# Patient Record
Sex: Male | Born: 1960 | Race: White | Hispanic: No | Marital: Married | Smoking: Never smoker
Health system: Southern US, Community
[De-identification: ages and names within clinical notes are randomized; demographics above are authoritative.]

## PROBLEM LIST (undated history)

## (undated) DIAGNOSIS — F329 Major depressive disorder, single episode, unspecified: Secondary | ICD-10-CM

## (undated) DIAGNOSIS — I2699 Other pulmonary embolism without acute cor pulmonale: Secondary | ICD-10-CM

## (undated) DIAGNOSIS — D6851 Activated protein C resistance: Secondary | ICD-10-CM

## (undated) DIAGNOSIS — R7989 Other specified abnormal findings of blood chemistry: Secondary | ICD-10-CM

## (undated) DIAGNOSIS — N400 Enlarged prostate without lower urinary tract symptoms: Secondary | ICD-10-CM

## (undated) DIAGNOSIS — E785 Hyperlipidemia, unspecified: Secondary | ICD-10-CM

## (undated) DIAGNOSIS — I82409 Acute embolism and thrombosis of unspecified deep veins of unspecified lower extremity: Secondary | ICD-10-CM

## (undated) DIAGNOSIS — E7211 Homocystinuria: Secondary | ICD-10-CM

## (undated) DIAGNOSIS — E119 Type 2 diabetes mellitus without complications: Secondary | ICD-10-CM

## (undated) DIAGNOSIS — K219 Gastro-esophageal reflux disease without esophagitis: Secondary | ICD-10-CM

## (undated) DIAGNOSIS — S83519A Sprain of anterior cruciate ligament of unspecified knee, initial encounter: Secondary | ICD-10-CM

## (undated) HISTORY — DX: Other pulmonary embolism without acute cor pulmonale: I26.99

## (undated) HISTORY — DX: Benign prostatic hyperplasia without lower urinary tract symptoms: N40.0

## (undated) HISTORY — DX: Sprain of anterior cruciate ligament of unspecified knee, initial encounter: S83.519A

## (undated) HISTORY — PX: WISDOM TOOTH EXTRACTION: SHX21

## (undated) HISTORY — DX: Acute embolism and thrombosis of unspecified deep veins of unspecified lower extremity: I82.409

## (undated) HISTORY — DX: Activated protein C resistance: D68.51

## (undated) HISTORY — DX: Major depressive disorder, single episode, unspecified: F32.9

## (undated) HISTORY — DX: Gastro-esophageal reflux disease without esophagitis: K21.9

## (undated) HISTORY — DX: Hyperlipidemia, unspecified: E78.5

## (undated) HISTORY — DX: Homocystinuria: E72.11

## (undated) HISTORY — DX: Other specified abnormal findings of blood chemistry: R79.89

---

## 1986-01-04 HISTORY — PX: VASECTOMY: SHX75

## 1999-10-13 ENCOUNTER — Ambulatory Visit (HOSPITAL_COMMUNITY): Admission: RE | Admit: 1999-10-13 | Discharge: 1999-10-13 | Payer: Self-pay | Admitting: Family Medicine

## 1999-10-27 ENCOUNTER — Encounter: Payer: Self-pay | Admitting: Emergency Medicine

## 1999-10-27 ENCOUNTER — Inpatient Hospital Stay (HOSPITAL_COMMUNITY): Admission: EM | Admit: 1999-10-27 | Discharge: 1999-11-02 | Payer: Self-pay | Admitting: Emergency Medicine

## 1999-12-07 ENCOUNTER — Ambulatory Visit (HOSPITAL_COMMUNITY): Admission: RE | Admit: 1999-12-07 | Discharge: 1999-12-07 | Payer: Self-pay | Admitting: Critical Care Medicine

## 1999-12-07 ENCOUNTER — Encounter: Payer: Self-pay | Admitting: Critical Care Medicine

## 2001-01-04 HISTORY — PX: KNEE SURGERY: SHX244

## 2001-09-07 ENCOUNTER — Encounter: Payer: Self-pay | Admitting: Internal Medicine

## 2001-09-07 ENCOUNTER — Inpatient Hospital Stay (HOSPITAL_COMMUNITY): Admission: EM | Admit: 2001-09-07 | Discharge: 2001-09-15 | Payer: Self-pay | Admitting: *Deleted

## 2001-11-13 ENCOUNTER — Emergency Department (HOSPITAL_COMMUNITY): Admission: EM | Admit: 2001-11-13 | Discharge: 2001-11-13 | Payer: Self-pay | Admitting: Emergency Medicine

## 2001-11-13 ENCOUNTER — Encounter: Payer: Self-pay | Admitting: Emergency Medicine

## 2003-01-01 ENCOUNTER — Observation Stay (HOSPITAL_COMMUNITY): Admission: EM | Admit: 2003-01-01 | Discharge: 2003-01-02 | Payer: Self-pay | Admitting: Emergency Medicine

## 2003-01-03 ENCOUNTER — Ambulatory Visit (HOSPITAL_COMMUNITY): Admission: RE | Admit: 2003-01-03 | Discharge: 2003-01-03 | Payer: Self-pay | Admitting: *Deleted

## 2003-01-05 DIAGNOSIS — I82409 Acute embolism and thrombosis of unspecified deep veins of unspecified lower extremity: Secondary | ICD-10-CM

## 2003-01-05 DIAGNOSIS — F32A Depression, unspecified: Secondary | ICD-10-CM

## 2003-01-05 HISTORY — DX: Acute embolism and thrombosis of unspecified deep veins of unspecified lower extremity: I82.409

## 2003-01-05 HISTORY — DX: Depression, unspecified: F32.A

## 2003-05-08 ENCOUNTER — Encounter: Payer: Self-pay | Admitting: Internal Medicine

## 2003-07-26 ENCOUNTER — Ambulatory Visit (HOSPITAL_COMMUNITY): Admission: RE | Admit: 2003-07-26 | Discharge: 2003-07-26 | Payer: Self-pay | Admitting: Family Medicine

## 2003-11-05 ENCOUNTER — Ambulatory Visit: Payer: Self-pay

## 2003-12-03 ENCOUNTER — Ambulatory Visit: Payer: Self-pay | Admitting: *Deleted

## 2003-12-13 ENCOUNTER — Ambulatory Visit: Payer: Self-pay | Admitting: Internal Medicine

## 2004-01-02 ENCOUNTER — Ambulatory Visit: Payer: Self-pay | Admitting: *Deleted

## 2004-02-11 ENCOUNTER — Ambulatory Visit: Payer: Self-pay | Admitting: Cardiology

## 2004-03-24 ENCOUNTER — Ambulatory Visit: Payer: Self-pay | Admitting: Internal Medicine

## 2004-05-08 ENCOUNTER — Ambulatory Visit: Payer: Self-pay | Admitting: Cardiovascular Disease

## 2004-06-09 ENCOUNTER — Ambulatory Visit: Payer: Self-pay | Admitting: *Deleted

## 2004-07-21 ENCOUNTER — Ambulatory Visit: Payer: Self-pay | Admitting: Cardiology

## 2004-09-03 ENCOUNTER — Ambulatory Visit: Payer: Self-pay | Admitting: Internal Medicine

## 2004-09-29 ENCOUNTER — Ambulatory Visit: Payer: Self-pay | Admitting: Internal Medicine

## 2004-10-12 ENCOUNTER — Ambulatory Visit: Payer: Self-pay | Admitting: Cardiology

## 2004-11-24 ENCOUNTER — Ambulatory Visit: Payer: Self-pay | Admitting: Cardiology

## 2005-01-05 ENCOUNTER — Ambulatory Visit: Payer: Self-pay | Admitting: *Deleted

## 2005-01-11 ENCOUNTER — Ambulatory Visit: Payer: Self-pay | Admitting: Internal Medicine

## 2005-01-18 ENCOUNTER — Ambulatory Visit: Payer: Self-pay | Admitting: Internal Medicine

## 2005-01-18 ENCOUNTER — Ambulatory Visit (HOSPITAL_COMMUNITY): Admission: RE | Admit: 2005-01-18 | Discharge: 2005-01-18 | Payer: Self-pay | Admitting: Emergency Medicine

## 2005-01-19 ENCOUNTER — Ambulatory Visit: Payer: Self-pay | Admitting: Internal Medicine

## 2005-01-20 ENCOUNTER — Ambulatory Visit: Payer: Self-pay

## 2005-01-22 ENCOUNTER — Ambulatory Visit: Payer: Self-pay | Admitting: Cardiology

## 2005-01-25 ENCOUNTER — Ambulatory Visit: Payer: Self-pay | Admitting: Cardiology

## 2005-02-01 ENCOUNTER — Ambulatory Visit: Payer: Self-pay | Admitting: Cardiology

## 2005-02-14 ENCOUNTER — Emergency Department (HOSPITAL_COMMUNITY): Admission: EM | Admit: 2005-02-14 | Discharge: 2005-02-14 | Payer: Self-pay | Admitting: Emergency Medicine

## 2005-02-15 ENCOUNTER — Ambulatory Visit: Payer: Self-pay | Admitting: Internal Medicine

## 2005-02-22 ENCOUNTER — Ambulatory Visit: Payer: Self-pay | Admitting: Cardiology

## 2005-03-08 ENCOUNTER — Ambulatory Visit: Payer: Self-pay | Admitting: Cardiology

## 2005-03-30 ENCOUNTER — Ambulatory Visit: Payer: Self-pay | Admitting: Cardiology

## 2005-04-27 ENCOUNTER — Ambulatory Visit: Payer: Self-pay | Admitting: Cardiovascular Disease

## 2005-05-12 ENCOUNTER — Ambulatory Visit: Payer: Self-pay | Admitting: Cardiology

## 2005-06-01 ENCOUNTER — Ambulatory Visit: Payer: Self-pay | Admitting: Internal Medicine

## 2005-06-29 ENCOUNTER — Ambulatory Visit: Payer: Self-pay | Admitting: Cardiovascular Disease

## 2005-08-03 ENCOUNTER — Ambulatory Visit: Payer: Self-pay | Admitting: Cardiology

## 2005-08-31 ENCOUNTER — Ambulatory Visit: Payer: Self-pay | Admitting: *Deleted

## 2005-09-28 ENCOUNTER — Ambulatory Visit: Payer: Self-pay | Admitting: Cardiovascular Disease

## 2005-10-15 ENCOUNTER — Ambulatory Visit: Payer: Self-pay | Admitting: Cardiology

## 2005-11-16 ENCOUNTER — Ambulatory Visit: Payer: Self-pay | Admitting: *Deleted

## 2005-11-17 ENCOUNTER — Ambulatory Visit: Payer: Self-pay | Admitting: Internal Medicine

## 2005-11-17 LAB — CONVERTED CEMR LAB
ALT: 33 units/L (ref 0–40)
AST: 27 units/L (ref 0–37)
Albumin: 3.8 g/dL (ref 3.5–5.2)
Alkaline Phosphatase: 41 units/L (ref 39–117)
BUN: 16 mg/dL (ref 6–23)
Basophils Absolute: 0 10*3/uL (ref 0.0–0.1)
Basophils Relative: 0.2 % (ref 0.0–1.0)
CO2: 31 meq/L (ref 19–32)
Calcium: 9.2 mg/dL (ref 8.4–10.5)
Chloride: 104 meq/L (ref 96–112)
Chol/HDL Ratio, serum: 5.6
Cholesterol: 214 mg/dL (ref 0–200)
Creatinine, Ser: 1.2 mg/dL (ref 0.4–1.5)
Eosinophil percent: 1.7 % (ref 0.0–5.0)
GFR calc non Af Amer: 69 mL/min
Glomerular Filtration Rate, Af Am: 83 mL/min/{1.73_m2}
Glucose, Bld: 77 mg/dL (ref 70–99)
HCT: 46.5 % (ref 39.0–52.0)
HDL: 37.9 mg/dL — ABNORMAL LOW (ref >39.0)
Hemoglobin: 15.6 g/dL (ref 13.0–17.0)
Hgb A1c MFr Bld: 5.6 % (ref 4.6–6.0)
LDL DIRECT: 141.2 mg/dL
Lymphocytes Relative: 31.1 % (ref 12.0–46.0)
MCHC: 33.6 g/dL (ref 30.0–36.0)
MCV: 91 fL (ref 78.0–100.0)
Monocytes Absolute: 0.5 10*3/uL (ref 0.2–0.7)
Monocytes Relative: 7.5 % (ref 3.0–11.0)
Neutro Abs: 3.7 10*3/uL (ref 1.4–7.7)
Neutrophils Relative %: 59.5 % (ref 43.0–77.0)
PSA: 0.71 ng/mL (ref 0.10–4.00)
Platelets: 217 10*3/uL (ref 150–400)
Potassium: 4.2 meq/L (ref 3.5–5.1)
RBC: 5.1 M/uL (ref 4.22–5.81)
RDW: 12.1 % (ref 11.5–14.6)
Sodium: 140 meq/L (ref 135–145)
TSH: 1.42 microintl units/mL (ref 0.35–5.50)
Total Bilirubin: 0.6 mg/dL (ref 0.3–1.2)
Total CK: 127 units/L (ref 7–195)
Total Protein: 6.6 g/dL (ref 6.0–8.3)
Triglyceride fasting, serum: 232 mg/dL (ref 0–149)
VLDL: 46 mg/dL — ABNORMAL HIGH (ref 0–40)
WBC: 6.3 10*3/uL (ref 4.5–10.5)

## 2005-11-30 ENCOUNTER — Ambulatory Visit: Payer: Self-pay | Admitting: Cardiology

## 2005-12-14 ENCOUNTER — Ambulatory Visit: Payer: Self-pay | Admitting: Cardiovascular Disease

## 2005-12-30 ENCOUNTER — Ambulatory Visit: Payer: Self-pay | Admitting: Cardiology

## 2006-01-04 DIAGNOSIS — S83519A Sprain of anterior cruciate ligament of unspecified knee, initial encounter: Secondary | ICD-10-CM

## 2006-01-04 HISTORY — DX: Sprain of anterior cruciate ligament of unspecified knee, initial encounter: S83.519A

## 2006-01-27 ENCOUNTER — Ambulatory Visit: Payer: Self-pay | Admitting: Cardiology

## 2006-02-24 ENCOUNTER — Ambulatory Visit: Payer: Self-pay | Admitting: Cardiology

## 2006-03-22 ENCOUNTER — Ambulatory Visit: Payer: Self-pay | Admitting: Cardiology

## 2006-04-22 ENCOUNTER — Ambulatory Visit: Payer: Self-pay | Admitting: Cardiology

## 2006-05-09 ENCOUNTER — Ambulatory Visit: Payer: Self-pay | Admitting: Cardiovascular Disease

## 2006-05-23 ENCOUNTER — Ambulatory Visit: Payer: Self-pay | Admitting: Internal Medicine

## 2006-06-13 ENCOUNTER — Ambulatory Visit: Payer: Self-pay | Admitting: Internal Medicine

## 2006-07-14 ENCOUNTER — Ambulatory Visit: Payer: Self-pay | Admitting: Cardiovascular Disease

## 2006-08-09 ENCOUNTER — Ambulatory Visit: Payer: Self-pay | Admitting: Cardiology

## 2006-09-06 ENCOUNTER — Ambulatory Visit: Payer: Self-pay | Admitting: Cardiology

## 2006-10-11 ENCOUNTER — Ambulatory Visit: Payer: Self-pay | Admitting: Cardiovascular Disease

## 2006-11-07 ENCOUNTER — Ambulatory Visit: Payer: Self-pay | Admitting: Cardiovascular Disease

## 2006-11-29 ENCOUNTER — Telehealth (INDEPENDENT_AMBULATORY_CARE_PROVIDER_SITE_OTHER): Payer: Self-pay | Admitting: *Deleted

## 2006-12-02 ENCOUNTER — Emergency Department (HOSPITAL_COMMUNITY): Admission: EM | Admit: 2006-12-02 | Discharge: 2006-12-02 | Payer: Self-pay | Admitting: Emergency Medicine

## 2006-12-05 ENCOUNTER — Encounter: Payer: Self-pay | Admitting: Internal Medicine

## 2006-12-06 ENCOUNTER — Ambulatory Visit: Payer: Self-pay | Admitting: Cardiology

## 2006-12-07 ENCOUNTER — Encounter: Payer: Self-pay | Admitting: Internal Medicine

## 2006-12-12 ENCOUNTER — Encounter: Payer: Self-pay | Admitting: Internal Medicine

## 2007-01-03 ENCOUNTER — Ambulatory Visit: Payer: Self-pay | Admitting: Internal Medicine

## 2007-01-31 ENCOUNTER — Ambulatory Visit: Payer: Self-pay | Admitting: Cardiovascular Disease

## 2007-02-14 ENCOUNTER — Ambulatory Visit: Payer: Self-pay | Admitting: Cardiovascular Disease

## 2007-03-14 ENCOUNTER — Ambulatory Visit: Payer: Self-pay | Admitting: Cardiology

## 2007-04-11 ENCOUNTER — Ambulatory Visit: Payer: Self-pay | Admitting: Cardiology

## 2007-05-09 ENCOUNTER — Ambulatory Visit: Payer: Self-pay | Admitting: Cardiology

## 2007-05-26 ENCOUNTER — Ambulatory Visit: Payer: Self-pay | Admitting: Cardiovascular Disease

## 2007-06-20 ENCOUNTER — Ambulatory Visit: Payer: Self-pay | Admitting: Internal Medicine

## 2007-06-20 ENCOUNTER — Telehealth: Payer: Self-pay | Admitting: Internal Medicine

## 2007-06-20 DIAGNOSIS — D6851 Activated protein C resistance: Secondary | ICD-10-CM | POA: Insufficient documentation

## 2007-06-20 DIAGNOSIS — I80299 Phlebitis and thrombophlebitis of other deep vessels of unspecified lower extremity: Secondary | ICD-10-CM | POA: Insufficient documentation

## 2007-06-20 DIAGNOSIS — N400 Enlarged prostate without lower urinary tract symptoms: Secondary | ICD-10-CM | POA: Insufficient documentation

## 2007-06-20 DIAGNOSIS — G4730 Sleep apnea, unspecified: Secondary | ICD-10-CM | POA: Insufficient documentation

## 2007-06-20 DIAGNOSIS — E785 Hyperlipidemia, unspecified: Secondary | ICD-10-CM

## 2007-06-23 ENCOUNTER — Encounter (INDEPENDENT_AMBULATORY_CARE_PROVIDER_SITE_OTHER): Payer: Self-pay | Admitting: *Deleted

## 2007-06-27 ENCOUNTER — Ambulatory Visit: Payer: Self-pay | Admitting: Cardiovascular Disease

## 2007-07-04 ENCOUNTER — Encounter (INDEPENDENT_AMBULATORY_CARE_PROVIDER_SITE_OTHER): Payer: Self-pay | Admitting: *Deleted

## 2007-07-20 ENCOUNTER — Ambulatory Visit: Payer: Self-pay | Admitting: Cardiovascular Disease

## 2007-08-15 ENCOUNTER — Ambulatory Visit: Payer: Self-pay | Admitting: Cardiovascular Disease

## 2007-09-07 ENCOUNTER — Ambulatory Visit: Payer: Self-pay | Admitting: Cardiology

## 2007-10-03 ENCOUNTER — Ambulatory Visit: Payer: Self-pay | Admitting: Cardiology

## 2007-10-31 ENCOUNTER — Ambulatory Visit: Payer: Self-pay | Admitting: Cardiology

## 2007-12-05 ENCOUNTER — Ambulatory Visit: Payer: Self-pay | Admitting: Cardiovascular Disease

## 2007-12-25 ENCOUNTER — Telehealth (INDEPENDENT_AMBULATORY_CARE_PROVIDER_SITE_OTHER): Payer: Self-pay | Admitting: *Deleted

## 2008-01-08 ENCOUNTER — Ambulatory Visit: Payer: Self-pay | Admitting: Internal Medicine

## 2008-01-16 ENCOUNTER — Ambulatory Visit: Payer: Self-pay | Admitting: Cardiovascular Disease

## 2008-02-02 ENCOUNTER — Ambulatory Visit: Payer: Self-pay | Admitting: Cardiovascular Disease

## 2008-02-20 ENCOUNTER — Ambulatory Visit: Payer: Self-pay | Admitting: Cardiology

## 2008-03-08 ENCOUNTER — Ambulatory Visit: Payer: Self-pay | Admitting: Internal Medicine

## 2008-04-02 ENCOUNTER — Ambulatory Visit: Payer: Self-pay | Admitting: Cardiology

## 2008-04-25 ENCOUNTER — Ambulatory Visit: Payer: Self-pay | Admitting: Internal Medicine

## 2008-05-28 ENCOUNTER — Ambulatory Visit: Payer: Self-pay | Admitting: Cardiology

## 2008-06-05 ENCOUNTER — Encounter: Payer: Self-pay | Admitting: *Deleted

## 2008-06-20 ENCOUNTER — Ambulatory Visit: Payer: Self-pay | Admitting: Internal Medicine

## 2008-06-24 ENCOUNTER — Encounter (INDEPENDENT_AMBULATORY_CARE_PROVIDER_SITE_OTHER): Payer: Self-pay | Admitting: *Deleted

## 2008-06-24 ENCOUNTER — Telehealth (INDEPENDENT_AMBULATORY_CARE_PROVIDER_SITE_OTHER): Payer: Self-pay | Admitting: *Deleted

## 2008-07-10 ENCOUNTER — Encounter: Payer: Self-pay | Admitting: *Deleted

## 2008-07-23 ENCOUNTER — Ambulatory Visit: Payer: Self-pay | Admitting: Cardiology

## 2008-07-23 LAB — CONVERTED CEMR LAB
POC INR: 1.7
Prothrombin Time: 15.9 s

## 2008-07-25 ENCOUNTER — Telehealth: Payer: Self-pay | Admitting: Internal Medicine

## 2008-08-06 ENCOUNTER — Ambulatory Visit: Payer: Self-pay | Admitting: Cardiology

## 2008-08-06 LAB — CONVERTED CEMR LAB
POC INR: 1.7
Prothrombin Time: 15.9 s

## 2008-08-16 ENCOUNTER — Encounter: Payer: Self-pay | Admitting: Cardiology

## 2008-08-20 ENCOUNTER — Ambulatory Visit: Payer: Self-pay | Admitting: Cardiovascular Disease

## 2008-08-20 LAB — CONVERTED CEMR LAB: POC INR: 2

## 2008-09-10 ENCOUNTER — Ambulatory Visit: Payer: Self-pay | Admitting: Cardiology

## 2008-09-10 LAB — CONVERTED CEMR LAB: POC INR: 2.4

## 2008-09-11 ENCOUNTER — Encounter: Payer: Self-pay | Admitting: *Deleted

## 2008-09-11 LAB — CONVERTED CEMR LAB

## 2008-09-23 ENCOUNTER — Ambulatory Visit: Payer: Self-pay | Admitting: Internal Medicine

## 2008-09-25 LAB — CONVERTED CEMR LAB
Cholesterol: 175 mg/dL (ref 0–200)
HDL: 37.7 mg/dL — ABNORMAL LOW (ref >39.00)
Hgb A1c MFr Bld: 5.7 % (ref 4.6–6.5)
LDL Cholesterol: 102 mg/dL — ABNORMAL HIGH (ref 0–99)
Total CHOL/HDL Ratio: 5
Triglycerides: 177 mg/dL — ABNORMAL HIGH (ref 0.0–149.0)
VLDL: 35.4 mg/dL (ref 0.0–40.0)

## 2008-10-01 ENCOUNTER — Ambulatory Visit: Payer: Self-pay | Admitting: Cardiology

## 2008-10-01 LAB — CONVERTED CEMR LAB: POC INR: 3.2

## 2008-10-29 ENCOUNTER — Ambulatory Visit: Payer: Self-pay | Admitting: Cardiovascular Disease

## 2008-10-29 LAB — CONVERTED CEMR LAB: POC INR: 3

## 2008-10-30 ENCOUNTER — Encounter: Payer: Self-pay | Admitting: Internal Medicine

## 2008-11-19 ENCOUNTER — Ambulatory Visit: Payer: Self-pay | Admitting: Internal Medicine

## 2008-11-19 DIAGNOSIS — M545 Low back pain: Secondary | ICD-10-CM | POA: Insufficient documentation

## 2008-11-26 ENCOUNTER — Ambulatory Visit: Payer: Self-pay | Admitting: Cardiovascular Disease

## 2008-11-26 LAB — CONVERTED CEMR LAB: POC INR: 2.8

## 2008-12-13 ENCOUNTER — Telehealth (INDEPENDENT_AMBULATORY_CARE_PROVIDER_SITE_OTHER): Payer: Self-pay | Admitting: Cardiology

## 2008-12-13 ENCOUNTER — Ambulatory Visit: Payer: Self-pay | Admitting: Cardiology

## 2008-12-13 ENCOUNTER — Encounter (INDEPENDENT_AMBULATORY_CARE_PROVIDER_SITE_OTHER): Payer: Self-pay | Admitting: Cardiology

## 2008-12-13 LAB — CONVERTED CEMR LAB: POC INR: 2.6

## 2009-01-27 ENCOUNTER — Ambulatory Visit: Payer: Self-pay | Admitting: Internal Medicine

## 2009-01-27 LAB — CONVERTED CEMR LAB: POC INR: 2.2

## 2009-02-25 ENCOUNTER — Ambulatory Visit: Payer: Self-pay | Admitting: Cardiology

## 2009-02-25 LAB — CONVERTED CEMR LAB: POC INR: 2.8

## 2009-03-25 ENCOUNTER — Ambulatory Visit: Payer: Self-pay | Admitting: Cardiovascular Disease

## 2009-03-25 LAB — CONVERTED CEMR LAB: POC INR: 2.5

## 2009-04-22 ENCOUNTER — Ambulatory Visit: Payer: Self-pay | Admitting: Cardiovascular Disease

## 2009-04-22 LAB — CONVERTED CEMR LAB: POC INR: 2.9

## 2009-05-20 ENCOUNTER — Ambulatory Visit: Payer: Self-pay | Admitting: Internal Medicine

## 2009-05-20 LAB — CONVERTED CEMR LAB: POC INR: 3.4

## 2009-06-24 ENCOUNTER — Ambulatory Visit: Payer: Self-pay

## 2009-06-24 LAB — CONVERTED CEMR LAB: POC INR: 3

## 2009-06-25 ENCOUNTER — Ambulatory Visit: Payer: Self-pay | Admitting: Internal Medicine

## 2009-06-26 ENCOUNTER — Ambulatory Visit: Payer: Self-pay | Admitting: Internal Medicine

## 2009-06-26 DIAGNOSIS — M25469 Effusion, unspecified knee: Secondary | ICD-10-CM

## 2009-07-22 ENCOUNTER — Ambulatory Visit: Payer: Self-pay | Admitting: Internal Medicine

## 2009-07-22 LAB — CONVERTED CEMR LAB: POC INR: 3.4

## 2009-08-19 ENCOUNTER — Ambulatory Visit: Payer: Self-pay | Admitting: Cardiology

## 2009-08-19 LAB — CONVERTED CEMR LAB: POC INR: 2.5

## 2009-09-16 ENCOUNTER — Ambulatory Visit: Payer: Self-pay | Admitting: Cardiovascular Disease

## 2009-09-16 LAB — CONVERTED CEMR LAB: POC INR: 2.8

## 2009-09-26 ENCOUNTER — Telehealth (INDEPENDENT_AMBULATORY_CARE_PROVIDER_SITE_OTHER): Payer: Self-pay | Admitting: *Deleted

## 2009-10-14 ENCOUNTER — Ambulatory Visit: Payer: Self-pay | Admitting: Cardiology

## 2009-10-14 LAB — CONVERTED CEMR LAB: POC INR: 2.9

## 2009-11-11 ENCOUNTER — Ambulatory Visit: Payer: Self-pay | Admitting: Internal Medicine

## 2009-11-11 LAB — CONVERTED CEMR LAB: POC INR: 2.9

## 2009-12-09 ENCOUNTER — Ambulatory Visit: Payer: Self-pay | Admitting: Cardiovascular Disease

## 2009-12-09 LAB — CONVERTED CEMR LAB: POC INR: 2.8

## 2010-01-06 ENCOUNTER — Ambulatory Visit: Admission: RE | Admit: 2010-01-06 | Discharge: 2010-01-06 | Payer: Self-pay | Source: Home / Self Care

## 2010-01-25 ENCOUNTER — Encounter: Payer: Self-pay | Admitting: Family Medicine

## 2010-02-01 LAB — CONVERTED CEMR LAB
ALT: 22 units/L (ref 0–53)
ALT: 29 units/L (ref 0–53)
ALT: 30 units/L (ref 0–53)
AST: 20 units/L (ref 0–37)
AST: 25 units/L (ref 0–37)
AST: 26 units/L (ref 0–37)
Albumin: 4.1 g/dL (ref 3.5–5.2)
Albumin: 4.2 g/dL (ref 3.5–5.2)
Albumin: 4.3 g/dL (ref 3.5–5.2)
Alkaline Phosphatase: 40 units/L (ref 39–117)
Alkaline Phosphatase: 44 units/L (ref 39–117)
Alkaline Phosphatase: 48 units/L (ref 39–117)
BUN: 16 mg/dL (ref 6–23)
BUN: 17 mg/dL (ref 6–23)
BUN: 18 mg/dL (ref 6–23)
Basophils Absolute: 0 10*3/uL (ref 0.0–0.1)
Basophils Absolute: 0 10*3/uL (ref 0.0–0.1)
Basophils Absolute: 0 10*3/uL (ref 0.0–0.1)
Basophils Relative: 0.1 % (ref 0.0–3.0)
Basophils Relative: 0.3 % (ref 0.0–1.0)
Basophils Relative: 0.5 % (ref 0.0–3.0)
Bilirubin, Direct: 0.1 mg/dL (ref 0.0–0.3)
Bilirubin, Direct: 0.1 mg/dL (ref 0.0–0.3)
Bilirubin, Direct: 0.1 mg/dL (ref 0.0–0.3)
CO2: 29 meq/L (ref 19–32)
CO2: 30 meq/L (ref 19–32)
CO2: 33 meq/L — ABNORMAL HIGH (ref 19–32)
Calcium: 9 mg/dL (ref 8.4–10.5)
Calcium: 9.2 mg/dL (ref 8.4–10.5)
Calcium: 9.3 mg/dL (ref 8.4–10.5)
Chloride: 103 meq/L (ref 96–112)
Chloride: 105 meq/L (ref 96–112)
Chloride: 105 meq/L (ref 96–112)
Cholesterol, target level: 200 mg/dL
Cholesterol: 202 mg/dL — ABNORMAL HIGH (ref 0–200)
Cholesterol: 203 mg/dL — ABNORMAL HIGH (ref 0–200)
Cholesterol: 208 mg/dL (ref 0–200)
Creatinine, Ser: 1.1 mg/dL (ref 0.4–1.5)
Creatinine, Ser: 1.2 mg/dL (ref 0.4–1.5)
Creatinine, Ser: 1.2 mg/dL (ref 0.4–1.5)
Direct LDL: 128.1 mg/dL
Direct LDL: 136.2 mg/dL
Direct LDL: 137.4 mg/dL
Eosinophils Absolute: 0.1 10*3/uL (ref 0.0–0.7)
Eosinophils Absolute: 0.1 10*3/uL (ref 0.0–0.7)
Eosinophils Absolute: 0.1 10*3/uL (ref 0.0–0.7)
Eosinophils Relative: 1.6 % (ref 0.0–5.0)
Eosinophils Relative: 1.7 % (ref 0.0–5.0)
Eosinophils Relative: 2.1 % (ref 0.0–5.0)
GFR calc Af Amer: 83 mL/min
GFR calc non Af Amer: 67.84 mL/min (ref >60)
GFR calc non Af Amer: 69 mL/min
GFR calc non Af Amer: 75.32 mL/min (ref >60)
Glucose, Bld: 109 mg/dL — ABNORMAL HIGH (ref 70–99)
Glucose, Bld: 92 mg/dL (ref 70–99)
Glucose, Bld: 96 mg/dL (ref 70–99)
HCT: 44.4 % (ref 39.0–52.0)
HCT: 45.5 % (ref 39.0–52.0)
HCT: 46 % (ref 39.0–52.0)
HDL goal, serum: 40 mg/dL
HDL: 38.3 mg/dL — ABNORMAL LOW (ref >39.0)
HDL: 40.2 mg/dL (ref >39.00)
HDL: 40.6 mg/dL (ref >39.00)
Hemoglobin: 15.5 g/dL (ref 13.0–17.0)
Hemoglobin: 16.1 g/dL (ref 13.0–17.0)
Hemoglobin: 16.2 g/dL (ref 13.0–17.0)
INR: 2 — ABNORMAL HIGH (ref 0.8–1.0)
LDL Goal: 100 mg/dL
Lymphocytes Relative: 32.1 % (ref 12.0–46.0)
Lymphocytes Relative: 33.9 % (ref 12.0–46.0)
Lymphocytes Relative: 34.8 % (ref 12.0–46.0)
Lymphs Abs: 1.9 10*3/uL (ref 0.7–4.0)
Lymphs Abs: 2.1 10*3/uL (ref 0.7–4.0)
MCHC: 35 g/dL (ref 30.0–36.0)
MCHC: 35.1 g/dL (ref 30.0–36.0)
MCHC: 35.6 g/dL (ref 30.0–36.0)
MCV: 90.9 fL (ref 78.0–100.0)
MCV: 92.2 fL (ref 78.0–100.0)
MCV: 93.1 fL (ref 78.0–100.0)
Monocytes Absolute: 0.4 10*3/uL (ref 0.1–1.0)
Monocytes Absolute: 0.5 10*3/uL (ref 0.1–1.0)
Monocytes Absolute: 0.5 10*3/uL (ref 0.1–1.0)
Monocytes Relative: 7.8 % (ref 3.0–12.0)
Monocytes Relative: 8 % (ref 3.0–12.0)
Monocytes Relative: 8.9 % (ref 3.0–12.0)
Neutro Abs: 3.1 10*3/uL (ref 1.4–7.7)
Neutro Abs: 3.1 10*3/uL (ref 1.4–7.7)
Neutro Abs: 3.7 10*3/uL (ref 1.4–7.7)
Neutrophils Relative %: 53.9 % (ref 43.0–77.0)
Neutrophils Relative %: 55.9 % (ref 43.0–77.0)
Neutrophils Relative %: 58.4 % (ref 43.0–77.0)
PSA: 1 ng/mL (ref 0.10–4.00)
PSA: 1.02 ng/mL (ref 0.10–4.00)
PSA: 1.36 ng/mL (ref 0.10–4.00)
Platelets: 191 10*3/uL (ref 150.0–400.0)
Platelets: 200 10*3/uL (ref 150–400)
Platelets: 202 10*3/uL (ref 150.0–400.0)
Potassium: 4.1 meq/L (ref 3.5–5.1)
Potassium: 4.5 meq/L (ref 3.5–5.1)
Potassium: 4.6 meq/L (ref 3.5–5.1)
Prothrombin Time: 20.5 s — ABNORMAL HIGH (ref 10.9–13.3)
RBC: 4.77 M/uL (ref 4.22–5.81)
RBC: 4.99 M/uL (ref 4.22–5.81)
RBC: 5.01 M/uL (ref 4.22–5.81)
RDW: 12.1 % (ref 11.5–14.6)
RDW: 12.5 % (ref 11.5–14.6)
RDW: 13.2 % (ref 11.5–14.6)
Sodium: 139 meq/L (ref 135–145)
Sodium: 141 meq/L (ref 135–145)
Sodium: 142 meq/L (ref 135–145)
TSH: 0.97 microintl units/mL (ref 0.35–5.50)
TSH: 1.04 microintl units/mL (ref 0.35–5.50)
TSH: 1.26 microintl units/mL (ref 0.35–5.50)
Total Bilirubin: 0.6 mg/dL (ref 0.3–1.2)
Total Bilirubin: 0.8 mg/dL (ref 0.3–1.2)
Total Bilirubin: 1.1 mg/dL (ref 0.3–1.2)
Total CHOL/HDL Ratio: 5
Total CHOL/HDL Ratio: 5
Total CHOL/HDL Ratio: 5.4
Total Protein: 6.7 g/dL (ref 6.0–8.3)
Total Protein: 6.9 g/dL (ref 6.0–8.3)
Total Protein: 7 g/dL (ref 6.0–8.3)
Triglycerides: 202 mg/dL — ABNORMAL HIGH (ref 0.0–149.0)
Triglycerides: 268 mg/dL (ref 0–149)
Triglycerides: 295 mg/dL — ABNORMAL HIGH (ref 0.0–149.0)
VLDL: 40.4 mg/dL — ABNORMAL HIGH (ref 0.0–40.0)
VLDL: 54 mg/dL — ABNORMAL HIGH (ref 0–40)
VLDL: 59 mg/dL — ABNORMAL HIGH (ref 0.0–40.0)
WBC: 5.6 10*3/uL (ref 4.5–10.5)
WBC: 5.7 10*3/uL (ref 4.5–10.5)
WBC: 6.4 10*3/uL (ref 4.5–10.5)

## 2010-02-03 ENCOUNTER — Ambulatory Visit: Admission: RE | Admit: 2010-02-03 | Discharge: 2010-02-03 | Payer: Self-pay | Source: Home / Self Care

## 2010-02-03 NOTE — Medication Information (Signed)
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 yes       Details: PRN  mucinex  Recent/future dental: no  Any missed doses?: no       Is patient compliant with meds? yes       Allergies: 1)  ! Codeine 2)  ! Niacin  Anticoagulation Management History:      The patient is taking warfarin and comes in today for a routine follow up visit.  Negative risk factors for bleeding include an age less than 73 years old and no history of CVA/TIA.  The bleeding index is 'low risk'.  Negative CHADS2 values include History of HTN, Age > 55 years old, History of Diabetes, and Prior Stroke/CVA/TIA.  The start date was 10/24/1999.  His last INR was 2.0 ratio.  Anticoagulation responsible provider: Eden Emms MD, Theron Arista.  INR POC: 2.8.  Cuvette Lot#: [id number].  Exp: 10/2010.    Anticoagulation Management Assessment/Plan:      The patient's current anticoagulation dose is Coumadin 5 mg  tabs: Take as directed by coumadin clinic..  The target INR is 2.5-3.5.  The next INR is due 01/06/2010.  Anticoagulation instructions were given to patient.  Results were reviewed/authorized by Bethena Midget, RN, BSN.  He was notified by Bethena Midget, RN, BSN.         Prior Anticoagulation Instructions: INR 2.9  Continue same dose of 1 tablet every day.  Recheck INR in 4 weeks.   Current Anticoagulation Instructions: INR 2.8 Continue 5mg s daily. Recheck in 4 weeks.

## 2010-02-03 NOTE — Medication Information (Signed)
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 no    Recent/future dental: no  Any missed doses?: no       Is patient compliant with meds? yes       Allergies: 1)  ! Codeine 2)  ! Niacin  Anticoagulation Management History:      The patient is taking warfarin and comes in today for a routine follow up visit.  Negative risk factors for bleeding include an age less than 52 years old and no history of CVA/TIA.  The bleeding index is 'low risk'.  Negative CHADS2 values include History of HTN, Age > 52 years old, History of Diabetes, and Prior Stroke/CVA/TIA.  The start date was 10/24/1999.  His last INR was 2.0 ratio.  Anticoagulation responsible provider: Graciela Husbands MD, Viviann Spare.  INR POC: 2.9.  Cuvette Lot#: [id number].  Exp: 12/2010.    Anticoagulation Management Assessment/Plan:      The patient's current anticoagulation dose is Coumadin 5 mg  tabs: Take as directed by coumadin clinic..  The target INR is 2.5-3.5.  The next INR is due 12/09/2009.  Anticoagulation instructions were given to patient.  Results were reviewed/authorized by Weston Brass, PharmD.  He was notified by Weston Brass PharmD.         Prior Anticoagulation Instructions: INR 2.9  Continue taking 1 tablet everyday. Recheck in 4 weeks.   Current Anticoagulation Instructions: INR 2.9  Continue same dose of 1 tablet every day.  Recheck INR in 4 weeks.

## 2010-02-03 NOTE — Progress Notes (Signed)
Summary: VYTORIN REFILL  Phone Note Refill Request Message from:  Fax from Pharmacy on September 26, 2009 3:04 PM  Refills Requested: Medication #1:  VYTORIN 10-40 MG  TABS Take one tablet daily [facility], [address] - [phone]  Initial call taken by: Jerolyn Shin,  September 26, 2009 3:05 PM    Prescriptions: VYTORIN 10-40 MG  TABS (EZETIMIBE-SIMVASTATIN) Take one tablet daily  #90 x 1   Entered by:   Doristine Devoid CMA   Authorized by:   Marga Melnick MD   Signed by:   Doristine Devoid CMA on 09/26/2009   Method used:   Electronically to        [facility]* (retail)       [address] [place], [place]  [address]       Ph: [phone] or [phone]       Fax: [phone]   RxID:   [id number]

## 2010-02-03 NOTE — Medication Information (Signed)
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 no    Recent/future dental: no  Any missed doses?: no       Is patient compliant with meds? yes       Current Medications (verified): 1)  Vytorin 10-40 Mg  Tabs (Ezetimibe-Simvastatin) .... Take One Tablet Daily 2)  Vitamin C 3)  Fish Oil 4)  B12 5)  Coumadin 5 Mg  Tabs (Warfarin Sodium) .... Take As Directed By Coumadin Clinic. 6)  Folic Acid 400 Mcg Tabs (Folic Acid) .Marland Kitchen.. 1 By Mouth Once Daily 7)  Cinnamon 500 Mg Caps (Cinnamon) .Marland Kitchen.. 1 By Mouth Once Daily  Allergies (verified): 1)  ! Codeine 2)  ! Niacin  Anticoagulation Management History:      The patient is taking warfarin and comes in today for a routine follow up visit.  Negative risk factors for bleeding include an age less than 37 years old and no history of CVA/TIA.  The bleeding index is 'low risk'.  Negative CHADS2 values include History of HTN, Age > 33 years old, History of Diabetes, and Prior Stroke/CVA/TIA.  The start date was 10/24/1999.  His last INR was 2.0 ratio.  Anticoagulation responsible Rashae Rother: Myrtis Ser MD, Tinnie Gens.  INR POC: 2.5.  Cuvette Lot#: [id number].  Exp: 09/2010.    Anticoagulation Management Assessment/Plan:      The patient's current anticoagulation dose is Coumadin 5 mg  tabs: Take as directed by coumadin clinic..  The target INR is 2.5-3.5.  The next INR is due 09/16/2009.  Anticoagulation instructions were given to patient.  Results were reviewed/authorized by Reina Fuse, PharmD.  He was notified  by Reina Fuse PharmD.         Prior Anticoagulation Instructions: INR 3.4  Continue same dose of 1 tab daily.  Re-check INR in 4 weeks.  Current Anticoagulation Instructions: INR 2.5   Continue taking Coumadin 1 tab (5 mg) every day. Return to clinic in 4 weeks.

## 2010-02-03 NOTE — Assessment & Plan Note (Signed)
Summary: CPX/FASTING/KDC   Vital Signs:  Patient profile:   50 year old male Height:      69.5 inches Weight:      189.6 pounds BMI:     27.70 Temp:     98.0 degrees F oral Pulse rate:   64 / minute Resp:     14 per minute BP sitting:   100 / 68  (left arm) Cuff size:   large  Vitals Entered By: Shonna Chock (June 25, 2009 9:33 AM)  CC: Lipid Management Comments REVIEWED MED LIST, PATIENT AGREED DOSE AND INSTRUCTION CORRECT  ** Patient states he travles to [place] often and TD Vaccine UTD**   CC:  Lipid Management.  History of Present Illness: Evan Cabrera is here for a physical; he is asymptomatic presently.  Lipid Management History:      Positive NCEP/ATP III risk factors include male age 81 years old or older, HDL cholesterol less than 40, and family history for ischemic heart disease (males less than 13 years old).  Negative NCEP/ATP III risk factors include non-diabetic, non-tobacco-user status, non-hypertensive, no ASHD (atherosclerotic heart disease), no prior stroke/TIA, no peripheral vascular disease, and no history of aortic aneurysm.     Allergies: 1)  ! Codeine 2)  ! Niacin  Past History:  Past Medical History: Hyperlipidemia: NMR Lipoprofile 2005: LDL 137(1845/1192), HDL 37,TG 188. LDL goal = < 100. Framingham Study LDL goal = < 130. 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 ? apnea as per Marcelino Duster; no am headaches or daytime somnulence  Physical Exam  General:  well-nourished; alert,appropriate and cooperative throughout examination Head:  Normocephalic and atraumatic without obvious abnormalities. No apparent alopecia  Eyes:  No corneal or conjunctival inflammation noted. Perrla. Funduscopic exam benign, without hemorrhages, exudates or papilledema.  Ears:  External ear exam shows no significant lesions or deformities.  Otoscopic examination reveals clear canals, tympanic membranes are intact bilaterally without bulging, retraction, inflammation or discharge. Hearing is grossly normal bilaterally. Nose:  External nasal examination shows no deformity or inflammation. Nasal mucosa are  minimally congested on R  without lesions or exudates. Mouth:  Oral mucosa and oropharynx without lesions or exudates.  Teeth in good repair. Neck:  No deformities, masses, or tenderness noted. Lungs:  Normal respiratory effort, chest expands symmetrically. Lungs are clear to auscultation, no crackles or wheezes. Heart:  regular rhythm, no murmur, no gallop, no rub, no JVD, no HJR, and bradycardia.  S4 Abdomen:  Bowel sounds positive,abdomen soft  without masses, organomegaly or hernias noted. Slight dullness LUQ Rectal:  No external abnormalities noted. Normal sphincter tone. No rectal masses or tenderness. Genitalia:  Testes bilaterally descended without nodularity, tenderness or masses. No scrotal masses or lesions. No penis lesions or urethral discharge. Vasectomy scar tissue R > L Prostate:  no nodules, no asymmetry, no induration, and 1+ enlarged.   Msk:  No deformity or scoliosis noted of thoracic or lumbar spine.  Pulses:  R and L carotid,radial,dorsalis pedis and posterior tibial pulses are full and equal bilaterally Extremities:  No clubbing, cyanosis, edema, or deformity noted with normal full range of motion of all joints.   Fungal nail changes R  great  toenail Neurologic:  alert & oriented X3 and DTRs symmetrical and normal.   Skin:  Intact without suspicious lesions or rashes Cervical Nodes:  No lymphadenopathy noted Axillary Nodes:  No palpable lymphadenopathy Inguinal Nodes:  No significant adenopathy Psych:  memory intact for recent and remote, normally interactive, and good eye contact.     Impression & Recommendations:  Problem # 1:  ROUTINE GENERAL MEDICAL EXAM@HEALTH  CARE FACL (ICD-V70.0)  Orders: EKG w/ Interpretation (93000) Venipuncture (09811) TLB-Lipid Panel (80061-LIPID) TLB-BMP (Basic Metabolic Panel-BMET) (80048-METABOL) TLB-CBC Platelet - w/Differential (85025-CBCD) TLB-Hepatic/Liver Function Pnl (80076-HEPATIC) TLB-TSH (Thyroid Stimulating Hormone) (84443-TSH) TLB-PSA (Prostate Specific Antigen) (84153-PSA)  Problem # 2:  HYPERLIPIDEMIA (ICD-272.4)  His updated medication list for this problem includes:    Vytorin 10-40 Mg Tabs (Ezetimibe-simvastatin) .Marland Kitchen... 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 Take one tablet daily 2)  Vitamin C  3)  Fish Oil  4)  B12  5)  Coumadin 5 Mg Tabs (Warfarin sodium) .... Take as directed by coumadin clinic. 6)  Folic Acid 400 Mcg Tabs (Folic acid) .Marland Kitchen.. 1 by mouth once daily  Lipid Assessment/Plan:      Based on NCEP/ATP III, the patient's risk factor category is "2 or more risk factors and a calculated 10 year CAD risk of < 20%".  The patient's lipid goals are as follows: Total cholesterol goal is 200; LDL cholesterol goal is 100; HDL cholesterol goal is 40; Triglyceride goal is 150.   His LDL cholesterol goal has been met.  Secondary causes for hyperlipidemia have been ruled out.  He has been counseled on adjunctive measures for lowering his cholesterol and has been provided with dietary instructions.    Patient Instructions: 1)  Check with insurance as to best facility for  a colonoscopy to help detect colon cancer & Sleep Study to assess possible Sleep Apnea.. Prescriptions: VYTORIN 10-40 MG  TABS (EZETIMIBE-SIMVASTATIN) Take one tablet daily  #90 x 3   Entered and Authorized by:   Marga Melnick MD   Signed by:   Marga Melnick MD on 06/25/2009   Method used:   Print then Give to Patient   RxID:   [id number]    Immunization History:  Tetanus/Td Immunization History:    Tetanus/Td:  historical (01/04/1998)    Appended Document: CPX/FASTING/KDC  Laboratory Results   Urine Tests   Date/Time Reported: June 25, 2009 10:56 AM   Routine Urinalysis   Color: yellow Appearance: Clear Glucose: negative   (Normal Range: Negative) Bilirubin: negative   (Normal Range: Negative) Ketone: negative   (Normal Range: Negative) Spec. Gravity: 1.020   (Normal Range: 1.003-1.035) Blood: negative   (Normal Range: Negative) pH: 5.0   (Normal Range: 5.0-8.0) Protein: negative   (Normal Range: Negative) Urobilinogen: negative   (Normal Range: 0-1) Nitrite: negative   (Normal Range: Negative) Leukocyte Esterace: negative   (Normal Range: Negative)    Comments: Rene Kocher  Hoops  June 25, 2009 10:56 AM

## 2010-02-03 NOTE — Assessment & Plan Note (Signed)
Summary: knee swollen- no pain/cbs   Vital Signs:  Patient profile:   50 year old male Weight:      189.6 pounds Temp:     98.6 degrees F oral Pulse rate:   64 / minute Resp:     15 per minute BP sitting:   104 / 70  (left arm) Cuff size:   large  Vitals Entered By: Shonna Chock (June 26, 2009 12:16 PM) CC: Left knee was swollen and painful ,  patient was doing yard work and tree hit knee and now knee pain is better and swelling went down-? what caused swelling Comments REVIEWED MED LIST, PATIENT AGREED DOSE AND INSTRUCTION CORRECT    CC:  Left knee was swollen and painful  and patient was doing yard work and tree hit knee and now knee pain is better and swelling went down-? what caused swelling.  History of Present Illness: Yesterday he noted tightening of knee with swelling w/o trigger or injury. This am a tree he was sawing up with a chain saw struck his knee with a" pop". Now decreased ROM ; tight only with flexion.The swelling & pressure DECREASED  after the blow. PT/INR was 3.0 two days ago; it has been 2.5 -3.0 for several months.                                                                                                                                                 CPX labs reviewed & dietary interventions discussed. He has been drinking excess Gatorade.  Allergies: 1)  ! Codeine 2)  ! Niacin  Physical Exam  General:  in no acute distress; alert,appropriate and cooperative throughout examination Extremities:  Pain with medial rotation R knee. Decreased flexion due to palpable effusion L knee. Skin:  Abrasions L lateral knee area.   Impression & Recommendations:  Problem # 1:  JOINT EFFUSION, LEFT KNEE (ICD-719.06) improved post trauma  Problem # 2:  HYPERLIPIDEMIA (ICD-272.4)  His updated medication list for this problem includes:    Vytorin 10-40 Mg Tabs (Ezetimibe-simvastatin) .Marland Kitchen... Take one tablet daily  Complete Medication List: 1)  Vytorin 10-40 Mg Tabs  (Ezetimibe-simvastatin) .... Take one tablet daily 2)  Vitamin C  3)  Fish Oil  4)  B12  5)  Coumadin 5 Mg Tabs (Warfarin sodium) .... Take as directed by coumadin clinic. 6)  Folic Acid 400 Mcg Tabs (Folic acid) .Marland Kitchen.. 1 by mouth once daily 7)  Cinnamon 500 Mg Caps (Cinnamon) .Marland Kitchen.. 1 by mouth once daily  Patient Instructions: 1)  RICE today as discussed. warm compresses after today.Ortho referral if effusion fails to resolve or progresses. Take 650-1000mg  of Tylenol every 4-6 hours as needed for relief of pain or comfort of fever AVOID taking more than 4000mg   in a 24 hour period (can cause liver damage in higher doses). Consume <  40 grams of HFCS "sugar"/ day.Please schedule a follow-up fasting Lab appointment in 4 months for  2)  Lipid Panel (272.4)

## 2010-02-03 NOTE — Medication Information (Signed)
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 no    Recent/future dental: no  Any missed doses?: no       Is patient compliant with meds? yes      Comments: Leaving for [place] on Friday 02/28/09-03/04/09.  Allergies (verified): 1)  ! Codeine 2)  ! Niacin  Anticoagulation Management History:      The patient is taking warfarin and comes in today for a routine follow up visit.  Negative risk factors for bleeding include an age less than 52 years old and no history of CVA/TIA.  The bleeding index is 'low risk'.  Negative CHADS2 values include History of HTN, Age > 52 years old, and Prior Stroke/CVA/TIA.  The start date was 10/24/1999.  His last INR was 2.0 ratio.  Anticoagulation responsible provider: Daleen Squibb MD, Maisie Fus.  INR POC: 2.8.  Exp: 02/2010.    Anticoagulation Management Assessment/Plan:      The patient's current anticoagulation dose is Coumadin 5 mg  tabs: Take as directed by coumadin clinic..  The target INR is 2.5-3.5.  The next INR is due 03/25/2009.  Anticoagulation instructions were given to patient.  Results were reviewed/authorized by Cloyde Reams, RN, BSN.  He was notified by Cloyde Reams RN.         Prior Anticoagulation Instructions: INR 2.2 Today take 7.5mg s then resume 5mg s everyday. Recheck in 4 weeks.   Current Anticoagulation Instructions: INR 2.8  Continue on same dosage 5mg  daily.  Recheck in 4 weeks.

## 2010-02-03 NOTE — Medication Information (Signed)
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 no    Recent/future dental: no  Any missed doses?: no       Is patient compliant with meds? yes       Allergies (verified): 1)  ! Codeine 2)  ! Niacin  Anticoagulation Management History:      The patient is taking warfarin and comes in today for a routine follow up visit.  Negative risk factors for bleeding include an age less than 45 years old and no history of CVA/TIA.  The bleeding index is 'low risk'.  Negative CHADS2 values include History of HTN, Age > 95 years old, and Prior Stroke/CVA/TIA.  The start date was 10/24/1999.  His last INR was 2.0 ratio.  Anticoagulation responsible provider: Myrtis Ser MD, Tinnie Gens.  INR POC: 2.5.  Cuvette Lot#: [id number].  Exp: 05/2010.    Anticoagulation Management Assessment/Plan:      The patient's current anticoagulation dose is Coumadin 5 mg  tabs: Take as directed by coumadin clinic..  The target INR is 2.5-3.5.  The next INR is due 04/22/2009.  Anticoagulation instructions were given to patient.  Results were reviewed/authorized by Cloyde Reams, RN, BSN.  He was notified by Cloyde Reams RN.         Prior Anticoagulation Instructions: INR 2.8  Continue on same dosage 5mg  daily.  Recheck in 4 weeks.    Current Anticoagulation Instructions: INR 2.5  Continue on same dosage 5mg  daily.  Recheck in 4 weeks.

## 2010-02-03 NOTE — Medication Information (Signed)
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 no    Recent/future dental: no  Any missed doses?: no       Is patient compliant with meds? yes       Allergies: 1)  ! Codeine 2)  ! Niacin  Anticoagulation Management History:      The patient is taking warfarin and comes in today for a routine follow up visit.  Negative risk factors for bleeding include an age less than 38 years old and no history of CVA/TIA.  The bleeding index is 'low risk'.  Negative CHADS2 values include History of HTN, Age > 26 years old, History of Diabetes, and Prior Stroke/CVA/TIA.  The start date was 10/24/1999.  His last INR was 2.0 ratio.  Anticoagulation responsible provider: Clifton Zakariyah MD, Cristal Deer.  INR POC: 2.8.  Cuvette Lot#: [id number].  Exp: 10/2010.    Anticoagulation Management Assessment/Plan:      The patient's current anticoagulation dose is Coumadin 5 mg  tabs: Take as directed by coumadin clinic..  The target INR is 2.5-3.5.  The next INR is due 10/14/2009.  Anticoagulation instructions were given to patient.  Results were reviewed/authorized by Weston Brass, PharmD.  He was notified by Harrel Carina, PharmD candidate.         Prior Anticoagulation Instructions: INR 2.5   Continue taking Coumadin 1 tab (5 mg) every day. Return to clinic in 4 weeks.   Current Anticoagulation Instructions: INR 2.8  Continue taking 1 tablet everyday. Re-check INR 4 weeks.

## 2010-02-03 NOTE — Medication Information (Signed)
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 Codeine 2)  ! Niacin  Anticoagulation Management History:      The patient is taking warfarin and comes in today for a routine follow up visit.  Negative risk factors for bleeding include an age less than 64 years old and no history of CVA/TIA.  The bleeding index is 'low risk'.  Negative CHADS2 values include History of HTN, Age > 58 years old, and Prior Stroke/CVA/TIA.  The start date was 10/24/1999.  His last INR was 2.0 ratio.  Anticoagulation responsible provider: Riley Kill MD, Maisie Fus.  INR POC: 3.0.  Cuvette Lot#: [id number].  Exp: 09/2010.    Anticoagulation Management Assessment/Plan:      The patient's current anticoagulation dose is Coumadin 5 mg  tabs: Take as directed by coumadin clinic..  The target INR is 2.5-3.5.  The next INR is due 07/22/2009.  Anticoagulation instructions were given to patient.  Results were reviewed/authorized by Weston Brass, PharmD.  He was notified by Weston Brass PharmD.         Prior Anticoagulation Instructions: INR 3.4 Continue 5mg  daily. Recheck in 4 weeks.   Current Anticoagulation Instructions: INR 3.0  Continue same dose of 5mg  daily.

## 2010-02-03 NOTE — Medication Information (Signed)
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 no    Recent/future dental: no  Any missed doses?: no       Is patient compliant with meds? yes       Allergies: 1)  ! Codeine 2)  ! Niacin  Anticoagulation Management History:      The patient is taking warfarin and comes in today for a routine follow up visit.  Negative risk factors for bleeding include an age less than 109 years old and no history of CVA/TIA.  The bleeding index is 'low risk'.  Negative CHADS2 values include History of HTN, Age > 26 years old, History of Diabetes, and Prior Stroke/CVA/TIA.  The start date was 10/24/1999.  His last INR was 2.0 ratio.  Anticoagulation responsible provider: Graciela Husbands MD,Masen Salvas.  INR POC: 3.4.  Cuvette Lot#: [id number].  Exp: 09/2010.    Anticoagulation Management Assessment/Plan:      The patient's current anticoagulation dose is Coumadin 5 mg  tabs: Take as directed by coumadin clinic..  The target INR is 2.5-3.5.  The next INR is due 08/19/2009.  Anticoagulation instructions were given to patient.  Results were reviewed/authorized by Weston Brass, PharmD.  He was notified by Dillard Cannon.         Prior Anticoagulation Instructions: INR 3.0  Continue same dose of 5mg  daily.    Current Anticoagulation Instructions: INR 3.4  Continue same dose of 1 tab daily.  Re-check INR in 4 weeks.

## 2010-02-03 NOTE — Medication Information (Signed)
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  Bleeding/hemorrhagic complications: no    Recent/future hospitalizations: no    Any changes in medication regimen? no    Recent/future dental: no  Any missed doses?: no       Is patient compliant with meds? yes       Allergies: 1)  ! Codeine 2)  ! Niacin  Anticoagulation Management History:      The patient is taking warfarin and comes in today for a routine follow up visit.  Negative risk factors for bleeding include an age less than 59 years old and no history of CVA/TIA.  The bleeding index is 'low risk'.  Negative CHADS2 values include History of HTN, Age > 28 years old, History of Diabetes, and Prior Stroke/CVA/TIA.  The start date was 10/24/1999.  His last INR was 2.0 ratio.  Anticoagulation responsible provider: Juanda Chance MD, Smitty Cords.  INR POC: 2.9.  Cuvette Lot#: [id number].  Exp: 11/2010.    Anticoagulation Management Assessment/Plan:      The patient's current anticoagulation dose is Coumadin 5 mg  tabs: Take as directed by coumadin clinic..  The target INR is 2.5-3.5.  The next INR is due 11/11/2009.  Anticoagulation instructions were given to patient.  Results were reviewed/authorized by Weston Brass, PharmD.  He was notified by Ilean Skill D candidate.         Prior Anticoagulation Instructions: INR 2.8  Continue taking 1 tablet everyday. Re-check INR 4 weeks.   Current Anticoagulation Instructions: INR 2.9  Continue taking 1 tablet everyday. Recheck in 4 weeks.

## 2010-02-03 NOTE — Medication Information (Signed)
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 no    Recent/future dental: no  Any missed doses?: no       Is patient compliant with meds? yes       Allergies: 1)  ! Codeine 2)  ! Niacin  Anticoagulation Management History:      The patient is taking warfarin and comes in today for a routine follow up visit.  Negative risk factors for bleeding include an age less than 51 years old and no history of CVA/TIA.  The bleeding index is 'low risk'.  Negative CHADS2 values include History of HTN, Age > 77 years old, and Prior Stroke/CVA/TIA.  The start date was 10/24/1999.  His last INR was 2.0 ratio.  Anticoagulation responsible provider: Excell Seltzer MD, Casimiro Needle.  INR POC: 2.9.  Cuvette Lot#: [id number].  Exp: 05/2010.    Anticoagulation Management Assessment/Plan:      The patient's current anticoagulation dose is Coumadin 5 mg  tabs: Take as directed by coumadin clinic..  The target INR is 2.5-3.5.  The next INR is due 05/20/2009.  Anticoagulation instructions were given to patient.  Results were reviewed/authorized by Elaina Pattee, PharmD.  He was notified by Elaina Pattee, PharmD.         Prior Anticoagulation Instructions: INR 2.5  Continue on same dosage 5mg  daily.  Recheck in 4 weeks.    Current Anticoagulation Instructions: INR 2.9. Take 1 tablet daily.

## 2010-02-03 NOTE — Medication Information (Signed)
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 no    Recent/future dental: no  Any missed doses?: no       Is patient compliant with meds? yes       Allergies: 1)  ! Codeine  Anticoagulation Management History:      The patient is taking warfarin and comes in today for a routine follow up visit.  Negative risk factors for bleeding include an age less than 20 years old and no history of CVA/TIA.  The bleeding index is 'low risk'.  Negative CHADS2 values include History of HTN, Age > 31 years old, and Prior Stroke/CVA/TIA.  The start date was 10/24/1999.  His last INR was 2.0 ratio.  Anticoagulation responsible provider: Daleen Squibb MD, Maisie Fus.  INR POC: 2.2.  Cuvette Lot#: [id number].  Exp: 02/2010.    Anticoagulation Management Assessment/Plan:      The patient's current anticoagulation dose is Coumadin 5 mg  tabs: Take as directed by coumadin clinic..  The target INR is 2.5-3.5.  The next INR is due 02/25/2009.  Anticoagulation instructions were given to patient.  Results were reviewed/authorized by Bethena Midget, RN, BSN.  He was notified by Bethena Midget, RN, BSN.         Prior Anticoagulation Instructions: INR 2.6  Continue 1 tab daily.    Recheck in 4 weeks.    Current Anticoagulation Instructions: INR 2.2 Today take 7.5mg s then resume 5mg s everyday. Recheck in 4 weeks.

## 2010-02-03 NOTE — Medication Information (Signed)
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 no    Recent/future dental: no  Any missed doses?: no       Is patient compliant with meds? yes       Allergies: 1)  ! Codeine 2)  ! Niacin  Anticoagulation Management History:      The patient is taking warfarin and comes in today for a routine follow up visit.  Negative risk factors for bleeding include an age less than 83 years old and no history of CVA/TIA.  The bleeding index is 'low risk'.  Negative CHADS2 values include History of HTN, Age > 55 years old, and Prior Stroke/CVA/TIA.  The start date was 10/24/1999.  His last INR was 2.0 ratio.  Anticoagulation responsible provider: Graciela Husbands MD, Viviann Spare.  INR POC: 3.4.  Cuvette Lot#: [id number].  Exp: 08/2010.    Anticoagulation Management Assessment/Plan:      The patient's current anticoagulation dose is Coumadin 5 mg  tabs: Take as directed by coumadin clinic..  The target INR is 2.5-3.5.  The next INR is due 06/17/2009.  Anticoagulation instructions were given to patient.  Results were reviewed/authorized by Bethena Midget, RN, BSN.  He was notified by Bethena Midget, RN, BSN.         Prior Anticoagulation Instructions: INR 2.9. Take 1 tablet daily.   Current Anticoagulation Instructions: INR 3.4 Continue 5mg  daily. Recheck in 4 weeks.

## 2010-02-05 NOTE — Medication Information (Signed)
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 no    Recent/future dental: no  Any missed doses?: no       Is patient compliant with meds? yes       Allergies: 1)  ! Codeine 2)  ! Niacin  Anticoagulation Management History:      Negative risk factors for bleeding include an age less than 64 years old and no history of CVA/TIA.  The bleeding index is 'low risk'.  Negative CHADS2 values include History of HTN, Age > 51 years old, History of Diabetes, and Prior Stroke/CVA/TIA.  The start date was 10/24/1999.  His last INR was 2.0 ratio.  Anticoagulation responsible provider: Eden Emms MD, Theron Arista.  INR POC: 2.5.  Exp: 10/2010.    Anticoagulation Management Assessment/Plan:      The patient's current anticoagulation dose is Coumadin 5 mg  tabs: Take as directed by coumadin clinic..  The target INR is 2.5-3.5.  The next INR is due 02/03/2010.  Anticoagulation instructions were given to patient.  Results were reviewed/authorized by Geoffry Paradise, PharmD.         Prior Anticoagulation Instructions: INR 2.8 Continue 5mg s daily. Recheck in 4 weeks.   Current Anticoagulation Instructions: INR:  2.5  Your INR is at the lower end of goal today.  Please continue taking coumadin 5 mg every day.  Return to clinic in 4 weeks for another INR check.

## 2010-02-11 NOTE — Medication Information (Signed)
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 no    Recent/future dental: no  Any missed doses?: no       Is patient compliant with meds? yes       Allergies: 1)  ! Codeine 2)  ! Niacin  Anticoagulation Management History:      The patient is taking warfarin and comes in today for a routine follow up visit.  Negative risk factors for bleeding include an age less than 39 years old and no history of CVA/TIA.  The bleeding index is 'low risk'.  Negative CHADS2 values include History of HTN, Age > 34 years old, History of Diabetes, and Prior Stroke/CVA/TIA.  The start date was 10/24/1999.  His last INR was 2.0 ratio.  Anticoagulation responsible provider: Shirlee Latch MD, [place].  INR POC: 2.8.  Cuvette Lot#: [id number].  Exp: 01/2011.    Anticoagulation Management Assessment/Plan:      The patient's current anticoagulation dose is Coumadin 5 mg  tabs: Take as directed by coumadin clinic..  The target INR is 2.5-3.5.  The next INR is due 03/03/2010.  Anticoagulation instructions were given to patient.  Results were reviewed/authorized by Weston Brass, PharmD.  He was notified by Stephannie Peters, PharmD Candidate .         Prior Anticoagulation Instructions: INR:  2.5  Your INR is at the lower end of goal today.  Please continue taking coumadin 5 mg every day.  Return to clinic in 4 weeks for another INR check.    Current Anticoagulation Instructions: INR 2.8  Coumadin 5 mg tablets - Continue 1 tablet every evening

## 2010-03-02 ENCOUNTER — Encounter: Payer: Self-pay | Admitting: Internal Medicine

## 2010-03-02 DIAGNOSIS — I82409 Acute embolism and thrombosis of unspecified deep veins of unspecified lower extremity: Secondary | ICD-10-CM | POA: Insufficient documentation

## 2010-03-02 DIAGNOSIS — I2699 Other pulmonary embolism without acute cor pulmonale: Secondary | ICD-10-CM | POA: Insufficient documentation

## 2010-03-03 ENCOUNTER — Encounter (INDEPENDENT_AMBULATORY_CARE_PROVIDER_SITE_OTHER): Payer: BC Managed Care – PPO

## 2010-03-03 ENCOUNTER — Encounter: Payer: Self-pay | Admitting: Cardiology

## 2010-03-03 DIAGNOSIS — I80299 Phlebitis and thrombophlebitis of other deep vessels of unspecified lower extremity: Secondary | ICD-10-CM

## 2010-03-03 DIAGNOSIS — I2699 Other pulmonary embolism without acute cor pulmonale: Secondary | ICD-10-CM

## 2010-03-03 DIAGNOSIS — Z7901 Long term (current) use of anticoagulants: Secondary | ICD-10-CM

## 2010-03-12 NOTE — Medication Information (Signed)
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 no    Recent/future dental: no  Any missed doses?: no       Is patient compliant with meds? yes       Allergies: 1)  ! Codeine 2)  ! Niacin  Anticoagulation Management History:      The patient is taking warfarin and comes in today for a routine follow up visit.  Negative risk factors for bleeding include an age less than 57 years old and no history of CVA/TIA.  The bleeding index is 'low risk'.  Negative CHADS2 values include History of HTN, Age > 37 years old, History of Diabetes, and Prior Stroke/CVA/TIA.  The start date was 10/24/1999.  His last INR was 2.0 ratio.  Anticoagulation responsible provider: Shirlee Latch MD, Bena Kobel.  INR POC: 2.9.  Cuvette Lot#: [id number].  Exp: 01/2011.    Anticoagulation Management Assessment/Plan:      The patient's current anticoagulation dose is Coumadin 5 mg  tabs: Take as directed by coumadin clinic..  The target INR is 2.5-3.5.  The next INR is due 03/31/2010.  Anticoagulation instructions were given to patient.  Results were reviewed/authorized by Evan Hummingbird, Evan Cabrera.  He was notified by Evan Hummingbird, Evan Cabrera.         Prior Anticoagulation Instructions: INR 2.8  Coumadin 5 mg tablets - Continue 1 tablet every evening   Current Anticoagulation Instructions: INR 2.9 Resume taking 1 tablet every day. Recheck in 4 weeks.

## 2010-03-31 ENCOUNTER — Ambulatory Visit (INDEPENDENT_AMBULATORY_CARE_PROVIDER_SITE_OTHER): Payer: BC Managed Care – PPO | Admitting: *Deleted

## 2010-03-31 DIAGNOSIS — I82409 Acute embolism and thrombosis of unspecified deep veins of unspecified lower extremity: Secondary | ICD-10-CM

## 2010-03-31 DIAGNOSIS — I2699 Other pulmonary embolism without acute cor pulmonale: Secondary | ICD-10-CM

## 2010-03-31 NOTE — Patient Instructions (Signed)
INR 2.8 Return to clinic in 4 weeks COnt with current regimen

## 2010-04-28 ENCOUNTER — Ambulatory Visit (INDEPENDENT_AMBULATORY_CARE_PROVIDER_SITE_OTHER): Payer: BC Managed Care – PPO | Admitting: *Deleted

## 2010-04-28 DIAGNOSIS — I82409 Acute embolism and thrombosis of unspecified deep veins of unspecified lower extremity: Secondary | ICD-10-CM

## 2010-04-28 DIAGNOSIS — I2699 Other pulmonary embolism without acute cor pulmonale: Secondary | ICD-10-CM

## 2010-05-22 NOTE — Discharge Summary (Signed)
[facility]  Patient:    Evan Cabrera, Evan Cabrera                         MRN: [id number] Adm. Date:  [id number] Disc. Date: 11/02/99 Attending:  Caleb Popp Dictator:   Earley Favor, RN, MSN, ACNP CC:         Selina Cooley, M.D.   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  LABORATORY DATA:  INR 3.1, PTT 119.  WBC 7.0, hemoglobin 13.2, hematocrit 36.1, platelets are 256.  Arterial blood gas revealed pH 7.37, pCO2 of 46, pO2 is not available, bicarbonate is 27.  Factor V Leiden is pending.  Protein C is 83, protein S is 99.  Sodium 139, potassium 5.0, chloride 104, glucose is 98, BUN is 18, creatinine 1.2.  Homocystine is 12.34.  Cholesterol 225, triglycerides 364, HDL is 71, LDL is 151.  Cardiolipin antibodies noted to be 6, which is negative.  Cardiolipin antibody IgG is 10.   First antibody was IgM, was 10.  Chest CT revealed bilateral pulmonary emboli.  A 12-lead EKG shows normal sinus rhythm with sinus arrhythmia, normal ECG.  HOSPITAL COURSE: #1 - BILATERAL PULMONARY EMBOLISM AND DEEP VEIN THROMBOSIS:  [name] was admitted on October 27, 1999, with DVT as documented by exam, along with pulmonary embolism was documented by CT scan.  He was treated with initial treatment with anticoagulation with heparin and placed on Coumadin.  Once his Coumadin and heparin levels became therapeutic heparin was discontinued, and he was continued on Coumadin.  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  This will be followed up on an outpatient basis, most likely by hematology consult. He reached maximal hospital benefit by November 02, 1999, and was discharged home in improved condition.  He will be continued on Coumadin, then  to follow up with Dr. Caleb Popp.  MEDICATIONS: 1. Coumadin 5 mg 1 q.d. 2. Vicodin 1-2 tablets q.4h. p.r.n. for pain.  ACTIVITY:  As tolerated, except for Coumadin restrictions.  DIET:  No restrictions, except for Coumadin restrictions.  FOLLOW-UP:  He has a follow-up with Dr. Delford Field on November 16, 1999, at 12 noon.  He has been instructed that the Coumadin clinic will call him to set up a time to evaluate his INR.  DISPOSITION/CONDITION ON DISCHARGE:  Bilateral pulmonary emboli have been addressed with heparin therapy and with the initiation of Coumadin therapy. He has a known mutation factor as described earlier and will be evaluated for prolonged anticoagulation therapy.  Most likely, hematology/oncology will be obtained in the near future.  The patients condition on discharge is improved. DD:  11/02/99 TD:  11/02/99 Job: [id number]

## 2010-05-22 NOTE — H&P (Signed)
Evan Cabrera, Evan Cabrera NO.:  [id number]   MEDICAL RECORD NO.:  [id number]                   PATIENT TYPE:  INP   LOCATION:  0364                                 FACILITY:  [facility]   PHYSICIAN:  Titus Dubin. Alwyn Ren, M.D. 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  The pain was described as constant, worse  with inspiration, and worse walking.  He also had some associated left jaw  discomfort, but no nausea, diaphoresis, or classic anginal pain.   He describes this as the same pain that he had with his pulmonary  thromboembolism in 10/01.   PAST MEDICAL HISTORY:  1. 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  He was on Coumadin from 10/01 to 8/02, which was     discontinued.  2. He has had a vasectomy as an outpatient.   FAMILY HISTORY:  Includes clotting disorder in his father who is on  Coumadin.  His father has insulin-dependent diabetes and myocardial  infarctions.  Mother had uterine cancer and hypertension.   SOCIAL HISTORY:  He has never smoked, and does not drink.  He has been  physically active recently.  Exercising to lose weight.  He had been on  Zocor, but stopped it in an attempt to control his cholesterol with diet and  exercise.  He rarely eats red meat at this time.  He runs 2 miles three  times per week with no cardiopulmonary symptoms.   MEDICATIONS:  1. Calcium 1200 mg q.d.  2. Vitamin C.  3. Multivitamins.  4. He had stopped his aspirin 325 mg q.d., as well as the  Zocor.   ALLERGIES:  He has been intolerant to CODEINE, LIPITOR, NYACIN.   REVIEW OF SYMPTOMS:  Generally negative.  Two weeks ago he had light-  headedness with exercise with associated diaphoresis, but again no angina.  The remainder of the review of systems was negative in toto.   PHYSICAL EXAMINATION:  GENERAL:  He appeared in no distress, although he did  have some pleuritic pain with inspiration with slight splinting deep  inspiration.  VITAL SIGNS:  Weight was 187, temperature 97.5, respiratory rate 18, and  pulse of 64 and regular.  Blood pressure was 110/74.  HEENT:  Otolaryngologic examination was unremarkable.  Fundal examination  and ophthalmologic examination revealed no pathology.  NECK:  Thyroid was normal to palpation.  LUNGS:  As noted,  he splints on the left, but had no rub, no increased work  of breathing.  A S4 was present without murmurs.  All pulses were intact.  EXTREMITIES:  No edema.  Homan's sign was negative.  ABDOMEN:  He had no lymphadenopathy, organomegaly, or abdominal tenderness.  MUSCULOSKELETAL:  No deficits.  NEUROPSYCHIATRIC:  Demonstrated no pathology.  SKIN:  Warm and dry.   LABORATORY DATA:  Chest x-ray in the office showed no active disease.  It  was a suboptimal film in that the right costophrenic angle was clipped, and  the apex on the lateral film was not fully visualized.  As noted, the pain  he was having was on the left.   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  Additionally, the clotting  studies will be repeated and a D-dimer performed.   There were no beds at [facility] or [facility].  I did  not feel comfortable having  these studies completed as an outpatient, and  therefore sent him to the emergency room for drawing of the clotting studies  prior to initiation of any anticoagulants.   Despite his clinical stability, he agreed with the likelihood of this being  recurrent emboli, and agrees to be admitted.  His wife was contacted to  transport him as he declined an ambulance transportation.  Clinically, he  was felt to be stable enough for this mode of transport.   Pharmacy will be contacted to see if Lovenox will be adequate coverage if  the scan is indeed positive.  Hematology consultation will be requested as  more likely than not he will need lifelong anticoagulation with Coumadin.                                                  Titus Dubin. Alwyn Ren, M.D. [facility]    WFH/MEDQ  D:  09/08/2001  T:  09/08/2001  Job:  [id number]   cc:   Charlcie Cradle. Delford Field, M.D. [facility]

## 2010-05-22 NOTE — Discharge Summary (Signed)
   NAME:  Evan Cabrera, Evan Cabrera                            ACCOUNT NO.:  [id number]   MEDICAL RECORD NO.:  [id number]                   PATIENT TYPE:  INP   LOCATION:  0364                                 FACILITY:  [facility]   PHYSICIAN:  Georgina Quint. Plotnikov, M.D. 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   PAST MEDICAL HISTORY:  1. History of pulmonary embolism.  2. History of factor V Leiden.  3. History of prothrombin mutation G2-0-2-1-0-8.   HISTORY OF PRESENT ILLNESS:  The patient is a 50 year old male with a past  medical history of DVT and PE in October of 2001 who has been off  anticoagulation for the last year.  He redeveloped pleuritic chest pain and  saw Dr. Alwyn Ren.  He was admitted.  For the details, please address the  history and physical.  His chest CT showed bilateral pulmonary emboli.   HOSPITAL COURSE:  During the course of hospitalization, the patient was  treated with IV heparin and started on oral Coumadin.  She continued with  pleuritic chest pain which has greatly improved over a period of time.  On  the day of discharge she is feeling well.  Temperature 97.2, respiratory  rate 16, blood pressure 122/57, O2 saturation 95% on room air.  Lungs clear.  No wheezes or rales.  Heart rate S1 and S2 no gallop.  Abdomen soft and  nontender.  Lower extremities without edema.  He is alert and cooperative.   Labs show INR 2.3.  Hemoglobin 15.1.  Other lab work as above.   DISCHARGE MEDICATIONS:  1. Coumadin 5 mg once a day.  2. Darvocet-N 100 one q.i.d. p.r.n. pain.   FOLLOW UP:  1. Genene Churn. Cyndie Chime, M.D., on September 18, 2001, at the cancer center     at 3:30.  2.     Titus Dubin. Alwyn Ren, M.D., in two weeks.  3. Coumadin clinic on September 18, 2001, at  12:30.   ACTIVITY:  No stenuous exercise for three weeks.                                               Georgina Quint. Plotnikov, M.D. [facility]    AVP/MEDQ  D:  09/15/2001  T:  09/15/2001  Job:  [id number]   cc:   Genene Churn. Cyndie Chime, M.D.  [address] [facility]  [place]  [place] [address]  Fax: [phone]   Titus Dubin. Alwyn Ren, M.D. [facility]   Shan Levans, MD [facility]  [address]  [place]  [place] [address]  Fax: 1

## 2010-05-22 NOTE — Discharge Summary (Signed)
NAME:  Evan Cabrera, Evan Cabrera                            ACCOUNT NO.:  [id number]   MEDICAL RECORD NO.:  [id number]                   PATIENT TYPE:  OBV   LOCATION:  0350                                 FACILITY:  [facility]   PHYSICIAN:  Rene Paci, M.D. [facility]          DATE OF BIRTH:  [birth year]   DATE OF ADMISSION:  01/01/2003  DATE OF DISCHARGE:  01/02/2003                                 DISCHARGE SUMMARY   DISCHARGE DIAGNOSES:  1. Chest pain rule out myocardial infarction negative, suspect     gastroesophageal reflux disease versus musculoskeletal.  2. 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  3. Hypercholesterolemia.  4. Gastroesophageal reflux disease.   DISCHARGE MEDICATIONS:  Discharge medications are as prior to admission and  include:  Coumadin 5 mg p.o. once daily, Zocor 40 mg p.o. once daily, folic  acid 1 mg p.o. once daily, multivitamin, fish oil, and Prilosec.  Patient is  encouraged to take Prilosec b.i.d. rather than once daily as prior to  admission.   HOSPITAL FOLLOWUP:  Hospital followup is for tomorrow to do a stress  Cardiolite at [facility].  Patient is given instructions to arrive  at outpatient admitting 7:45 a.m. and test to be performed by [facility]  Cardiology.  Then office followup with his primary care physician, Dr.  Marga Melnick for Thursday, January 17, 2003 at 10:30 a.m. to follow up  with the results of these and other problems related to this.  Patient is  reminded to return to the emergency room or call his primary care physician  if problems arise prior to this time.   CONDITION ON DISCHARGE:  Medically stable.   BRIEF HOSPITAL COURSE BY PROBLEM:  Problem 1. CHEST PAIN RULE OUT MYOCARDIAL  INFARCTION.  Patient is a pleasant 50 year old gentleman with  hypercholesterolemia, coagulopathy, and GERD who presented to the emergency  room complaining of chest tightness radiating around his left side to his  shoulder blade for the 12 hours prior to presentation.  He said this was  unlike any previous pleuritic pain related to blood clots and was concerned  for possible cardiac abnormalities.  His EKG in the emergency room was  completely normal as was his first set of cardiac enzymes but due to risk  factors he was admitted to telemetry for 24-hour observation with cardiac  enzymes.  He was treated with nitroglycerin drip though this did not seem to  change his pain.  Of note CT of the chest was done and negative for PE or  DVT.  Again note initial CT report read questionable bilateral DVT versus  artifact which was followed by a bilateral lower extremity Doppler which  showed no evidence of DVT, thrombus, or Baker's cyst.  As his telemetry and  enzymes were negative patient was discharged home  to be followed up within  24 hours with an outpatient nuclear stress test as described above.   Problem 2. HYPERCHOLESTEROLEMIA.  This is patient's greatest risk factor and  he has recently been started on Zocor approximately 1 month ago.  Lipid  profile was rechecked and his total cholesterol was 217 which patient  reports was improved, his triglycerides remained elevated in the 300s and  his HDL and LDL were relatively good at 36 and 106, respectively.  Continue  Zocor and folic acid as prior to admission.                                               Rene Paci, M.D. [facility]    VL/MEDQ  D:  01/02/2003  T:  01/02/2003  Job:  [id number]

## 2010-05-22 NOTE — Consult Note (Signed)
   NAME:  Evan Cabrera, Evan Cabrera                            ACCOUNT NO.:  [id number]   MEDICAL RECORD NO.:  [id number]                   PATIENT TYPE:  INP   LOCATION:  0364                                 FACILITY:  [facility]   PHYSICIAN:  Lowell C. Catha Gosselin, M.D.               DATE OF BIRTH:  12/12/1960   DATE OF CONSULTATION:  09/08/2001  DATE OF DISCHARGE:                                   CONSULTATION   SUMMARY:  The patient is a 50 year old male patient whose past medical  history is significant for deep vein thrombosis leading to pulmonary embolus  in October 2001.  He was treated with Coumadin from October 2001 until  August 2002, but has been off anticoagulation for the last year.  He was  doing well clinically until redeveloping pleuritic pain recently.  CT scan  of the chest on September 07, 2001 showed bilateral lower lobe pulmonary  emboli.  Subsequently he was placed on Heparin and now on Coumadin as well.   PAST MEDICAL HISTORY:  1. History of factor 5 Leiden.  2. History of prothrombin mutation, G2-0-2-1-0-8.   FAMILY HISTORY:  Positive for thromboembolic disease in his father; he is on  anticoagulation.   SOCIAL HISTORY:  No alcohol or tobacco use.   ALLERGIES:  CODEINE, LIPITOR, NIACIN.   PHYSICAL EXAMINATION:  Deferred today.   LABORATORY STUDIES:  Showed a white count 6.2, hemoglobin 14.6, platelet  count 230,000.  From 09/08/2001 PT 13.6, INR 1.0, PTT greater than 200 sec.  D-  dimer 0.86.  Sodium 141, potassium 4.0, creatinine 1.6.   ASSESSMENT:  Recurrent pulmonary emboli, in a patient with Factor 5 Leiden  and a prothrombin mutation.   PLAN:  We would recommend IV Heparin in the hospital x7 days.  We would  recommend lifelong Coumadin, with a goal INR of 2.5 to 3.0.  We will follow  his PT's in the cancer center upon discharge, and I will get an appointment  with Dr. Truett Perna upon discharge for long-term follow-up.  I will see him  again on September 11, 2001, but call us  for questions over the weekend at  [phone].   Thank you for allowing [place] to share in his care.                                               Lowell C. Catha Gosselin, M.D.    LCS/MEDQ  D:  09/08/2001  T:  09/08/2001  Job:  [id number]   cc:   Mancel Bale, M.D.   Titus Dubin. Alwyn Ren, M.D. [facility]

## 2010-05-26 ENCOUNTER — Ambulatory Visit (INDEPENDENT_AMBULATORY_CARE_PROVIDER_SITE_OTHER): Payer: BC Managed Care – PPO | Admitting: *Deleted

## 2010-05-26 DIAGNOSIS — I82409 Acute embolism and thrombosis of unspecified deep veins of unspecified lower extremity: Secondary | ICD-10-CM

## 2010-05-26 DIAGNOSIS — I2699 Other pulmonary embolism without acute cor pulmonale: Secondary | ICD-10-CM

## 2010-06-23 ENCOUNTER — Other Ambulatory Visit: Payer: Self-pay | Admitting: Internal Medicine

## 2010-06-23 ENCOUNTER — Encounter: Payer: BC Managed Care – PPO | Admitting: *Deleted

## 2010-06-24 ENCOUNTER — Ambulatory Visit (INDEPENDENT_AMBULATORY_CARE_PROVIDER_SITE_OTHER): Payer: BC Managed Care – PPO | Admitting: *Deleted

## 2010-06-24 DIAGNOSIS — I82409 Acute embolism and thrombosis of unspecified deep veins of unspecified lower extremity: Secondary | ICD-10-CM

## 2010-06-24 DIAGNOSIS — I2699 Other pulmonary embolism without acute cor pulmonale: Secondary | ICD-10-CM

## 2010-07-11 ENCOUNTER — Encounter: Payer: Self-pay | Admitting: Internal Medicine

## 2010-07-15 ENCOUNTER — Encounter: Payer: Self-pay | Admitting: Internal Medicine

## 2010-07-15 ENCOUNTER — Ambulatory Visit (INDEPENDENT_AMBULATORY_CARE_PROVIDER_SITE_OTHER): Payer: BC Managed Care – PPO | Admitting: Internal Medicine

## 2010-07-15 VITALS — BP 122/80 | HR 52 | Temp 98.4°F | Resp 12 | Ht 69.5 in | Wt 191.8 lb

## 2010-07-15 DIAGNOSIS — D682 Hereditary deficiency of other clotting factors: Secondary | ICD-10-CM

## 2010-07-15 DIAGNOSIS — I2699 Other pulmonary embolism without acute cor pulmonale: Secondary | ICD-10-CM

## 2010-07-15 DIAGNOSIS — E785 Hyperlipidemia, unspecified: Secondary | ICD-10-CM

## 2010-07-15 DIAGNOSIS — G4730 Sleep apnea, unspecified: Secondary | ICD-10-CM

## 2010-07-15 DIAGNOSIS — Z0000 Encounter for general adult medical examination without abnormal findings: Secondary | ICD-10-CM

## 2010-07-15 DIAGNOSIS — I82409 Acute embolism and thrombosis of unspecified deep veins of unspecified lower extremity: Secondary | ICD-10-CM

## 2010-07-15 LAB — CBC WITH DIFFERENTIAL/PLATELET
Basophils Relative: 0.4 % (ref 0.0–3.0)
Eosinophils Relative: 1.8 % (ref 0.0–5.0)
HCT: 44.3 % (ref 39.0–52.0)
Lymphs Abs: 1.9 10*3/uL (ref 0.7–4.0)
MCV: 93.5 fl (ref 78.0–100.0)
Monocytes Absolute: 0.4 10*3/uL (ref 0.1–1.0)
Monocytes Relative: 8 % (ref 3.0–12.0)
Neutrophils Relative %: 53.8 % (ref 43.0–77.0)
Platelets: 193 10*3/uL (ref 150.0–400.0)
RBC: 4.73 Mil/uL (ref 4.22–5.81)
WBC: 5.3 10*3/uL (ref 4.5–10.5)

## 2010-07-15 LAB — LDL CHOLESTEROL, DIRECT: Direct LDL: 122.1 mg/dL

## 2010-07-15 LAB — LIPID PANEL
Cholesterol: 195 mg/dL (ref 0–200)
Total CHOL/HDL Ratio: 5
Triglycerides: 279 mg/dL — ABNORMAL HIGH (ref 0.0–149.0)
VLDL: 55.8 mg/dL — ABNORMAL HIGH (ref 0.0–40.0)

## 2010-07-15 LAB — BASIC METABOLIC PANEL
Chloride: 108 mEq/L (ref 96–112)
GFR: 95.39 mL/min (ref >60.00)
Potassium: 5 mEq/L (ref 3.5–5.1)

## 2010-07-15 LAB — PSA: PSA: 0.86 ng/mL (ref 0.10–4.00)

## 2010-07-15 LAB — TSH: TSH: 1.47 u[IU]/mL (ref 0.35–5.50)

## 2010-07-15 LAB — HEPATIC FUNCTION PANEL
Albumin: 4.4 g/dL (ref 3.5–5.2)
Alkaline Phosphatase: 45 U/L (ref 39–117)
Total Protein: 6.7 g/dL (ref 6.0–8.3)

## 2010-07-15 NOTE — Patient Instructions (Addendum)
Preventive Health Care: Exercise at least 30-45 minutes a day,  3-4 days a week.  Eat a low-fat diet with lots of fruits and vegetables, up to 7-9 servings per day. Consume less than 40 grams of sugar per day from foods & drinks with High Fructose Corn Sugar as #1,2,3 or # 4 on label. Health Care Power of Attorney & Living Will. Complete if not in place ; these place you in charge of your health care decisions. As per the Standard of Care , screening Colonoscopy recommended @ 50 & every 5-10 years thereafter . More frequent monitor would be dictated by family history or findings @ Colonoscopy .

## 2010-07-15 NOTE — Progress Notes (Signed)
Subjective:    Patient ID: Evan Cabrera, male    DOB: 29-Jan-1960, 50 y.o.   MRN: [id number]  HPI  Evan Cabrera  is here for a physical;acute issues include some knee "popping" & slight discomfort      Review of Systems Patient reports no  vision/ hearing changes,anorexia, weight change, fever ,adenopathy, persistant / recurrent hoarseness,  chest pain,palpitations, edema,persistant / recurrent cough, hemoptysis, dyspnea(rest, exertional, paroxysmal nocturnal), gastrointestinal  bleeding (melena, rectal bleeding), abdominal pain, excessive heart burn, GU symptoms( dysuria, hematuria, pyuria, voiding/incontinence  issues) syncope, focal weakness, memory loss,numbness & tingling, skin/hair/nail changes,depression, or anxiety .  He does note some slight swelling issues with meals without frank dysphasia.  He has not had a colonoscopy; the standard of care was discussed.    Objective:   Physical Exam Gen.: Healthy and well-nourished in appearance. Alert, appropriate and cooperative throughout exam. Head: Normocephalic without obvious abnormalities;  no alopecia  Eyes: No corneal or conjunctival inflammation noted. Pupils equal round reactive to light and accommodation. Fundal exam is benign without hemorrhages, exudate, papilledema. Extraocular motion intact. Vision grossly normal. Ears: External  ear exam reveals no significant lesions or deformities. Canals clear .TMs normal. Hearing is grossly normal bilaterally. Nose: External nasal exam reveals no deformity or inflammation. Nasal mucosa are pink and moist. No lesions or exudates noted. R nare slightly swollen. Mouth: Oral mucosa and oropharynx reveal no lesions or exudates. Teeth in good repair. Neck: No deformities, masses, or tenderness noted. Range of motion &. Thyroid normal. Lungs: Normal respiratory effort; chest expands symmetrically. Lungs are clear to auscultation without rales, wheezes, or increased work of breathing. Heart: Slow rate  and regular rhythm. Normal S1 and S2. No gallop, click, or rub. S4 w/o  murmur. Abdomen: Bowel sounds normal; abdomen soft and nontender. No masses, organomegaly or hernias noted. Genitalia/DRE: Benign prosthetic hypertrophy is present without nodularity. Small varicocele is noted on the left. Minor hemorrhoidal tags present.   .                                                                                   Musculoskeletal/extremities: No deformity or scoliosis noted of  the thoracic or lumbar spine. No clubbing, cyanosis, edema, or deformity noted. Range of motion  normal .Tone & strength  Normal.Joints:minor isolated DIP changes. Nail health  Good.Mild crepitus of knees. Support stocking RLE. Vascular: Carotid, radial artery, dorsalis pedis and  posterior tibial pulses are full and equal. No bruits present. Neurologic: Alert and oriented x3. Deep tendon reflexes symmetrical and normal.          Skin: Intact without suspicious lesions or rashes. Lymph: No cervical, axillary, or inguinal lymphadenopathy present. Psych: Mood and affect are normal. Normally interactive  Assessment & Plan:  #1 comprehensive physical exam; no acute findings #2 see Problem List with Assessments & Recommendations Plan: see Orders

## 2010-07-15 NOTE — Assessment & Plan Note (Signed)
Decreased as per wife

## 2010-07-21 ENCOUNTER — Ambulatory Visit (INDEPENDENT_AMBULATORY_CARE_PROVIDER_SITE_OTHER): Payer: BC Managed Care – PPO | Admitting: *Deleted

## 2010-07-21 DIAGNOSIS — I82409 Acute embolism and thrombosis of unspecified deep veins of unspecified lower extremity: Secondary | ICD-10-CM

## 2010-07-21 DIAGNOSIS — I2699 Other pulmonary embolism without acute cor pulmonale: Secondary | ICD-10-CM

## 2010-07-21 LAB — POCT INR: INR: 2.7

## 2010-07-24 ENCOUNTER — Ambulatory Visit (INDEPENDENT_AMBULATORY_CARE_PROVIDER_SITE_OTHER): Payer: BC Managed Care – PPO | Admitting: Internal Medicine

## 2010-07-24 DIAGNOSIS — R1310 Dysphagia, unspecified: Secondary | ICD-10-CM

## 2010-07-24 DIAGNOSIS — Z8719 Personal history of other diseases of the digestive system: Secondary | ICD-10-CM

## 2010-07-24 MED ORDER — OMEPRAZOLE 20 MG PO CPDR: 20.0000 mg | DELAYED_RELEASE_CAPSULE | Freq: Two times a day (BID) | ORAL | Status: DC

## 2010-07-24 NOTE — Progress Notes (Signed)
  Subjective:    Patient ID: Evan Cabrera, male    DOB: 1960/09/07, 50 y.o.   MRN: [id number]  HPI For several weeks he's been having difficulty swallowing with every meal. Remotely he has a history of reflux for which he took a PPI. He has not taken Prilosec for at least 6 years.  He denies abdominal pain, melena, or rectal bleeding.  He has never had a colonoscopy; he is aware of the standard of care and is considering a screening study.  Unfortunately his brother-in-law had esophageal cancer; is going to be having surgery at [facility] next week.   Review of Systems     Objective:   Physical Exam He appears healthy and well-nourished  HEENT no scleral icterus  Dental hygiene is excellent; he has no erythema of the oropharynx.  Chest is clear auscultation  He is a regular rhythm with an S4; there is no significant murmur.  Abdomen is nontender. He has no organomegaly or masses.  Skin reveals no jaundice.          Assessment & Plan:  #1 dysphagia  #2 past medical history reflux  #3 coagulopathy; lifelong warfarin anticoagulation  Plan: Liquids over the weekend and a PPI 30 minutes before breakfast and evening meal. If the symptoms persist it is mandatory that he be seen by GI for upper endoscopy to rule out stricture.

## 2010-07-24 NOTE — Patient Instructions (Addendum)
The triggers for dyspepsia or "heart burn"  include stress; the "aspirin family" ; alcohol; peppermint; and caffeine (coffee, tea, cola, and chocolate). The aspirin family would include aspirin and the nonsteroidal agents such as ibuprofen &  Naproxen. Tylenol would not cause reflux. If having dyspepsia ; food & drink should be avoided for @ least 2 hours before going to bed. Stay on clear liquids for 48-72 hours .This would include  jello, sherbert (NOT ice cream), Lipton's chicken noodle soup(NOT cream based soups),Gatorade Lite, flat Ginger ale (without High Fructose Corn Syrup),dry toast or crackers, baked potato.  To ER IF  increasing pain, fever or rectal bleeding

## 2010-07-27 ENCOUNTER — Encounter: Payer: Self-pay | Admitting: Gastroenterology

## 2010-07-28 ENCOUNTER — Encounter: Payer: Self-pay | Admitting: Internal Medicine

## 2010-08-18 ENCOUNTER — Ambulatory Visit (INDEPENDENT_AMBULATORY_CARE_PROVIDER_SITE_OTHER): Payer: BC Managed Care – PPO | Admitting: *Deleted

## 2010-08-18 DIAGNOSIS — I2699 Other pulmonary embolism without acute cor pulmonale: Secondary | ICD-10-CM

## 2010-08-18 DIAGNOSIS — I82409 Acute embolism and thrombosis of unspecified deep veins of unspecified lower extremity: Secondary | ICD-10-CM

## 2010-08-18 LAB — POCT INR: INR: 2.7

## 2010-08-24 ENCOUNTER — Other Ambulatory Visit: Payer: Self-pay | Admitting: Cardiology

## 2010-08-24 MED ORDER — WARFARIN SODIUM 5 MG PO TABS: ORAL_TABLET | ORAL | Status: DC

## 2010-08-24 NOTE — Telephone Encounter (Signed)
Med refill by e-scribe. 

## 2010-08-28 ENCOUNTER — Ambulatory Visit (INDEPENDENT_AMBULATORY_CARE_PROVIDER_SITE_OTHER): Payer: BC Managed Care – PPO | Admitting: Gastroenterology

## 2010-08-28 ENCOUNTER — Encounter: Payer: Self-pay | Admitting: Gastroenterology

## 2010-08-28 ENCOUNTER — Telehealth: Payer: Self-pay | Admitting: Pharmacist

## 2010-08-28 DIAGNOSIS — Z8719 Personal history of other diseases of the digestive system: Secondary | ICD-10-CM | POA: Insufficient documentation

## 2010-08-28 DIAGNOSIS — R09A2 Foreign body sensation, throat: Secondary | ICD-10-CM | POA: Insufficient documentation

## 2010-08-28 DIAGNOSIS — R0989 Other specified symptoms and signs involving the circulatory and respiratory systems: Secondary | ICD-10-CM | POA: Insufficient documentation

## 2010-08-28 DIAGNOSIS — Z7901 Long term (current) use of anticoagulants: Secondary | ICD-10-CM

## 2010-08-28 DIAGNOSIS — K219 Gastro-esophageal reflux disease without esophagitis: Secondary | ICD-10-CM

## 2010-08-28 DIAGNOSIS — F458 Other somatoform disorders: Secondary | ICD-10-CM

## 2010-08-28 DIAGNOSIS — R1310 Dysphagia, unspecified: Secondary | ICD-10-CM

## 2010-08-28 DIAGNOSIS — D6859 Other primary thrombophilia: Secondary | ICD-10-CM

## 2010-08-28 DIAGNOSIS — D6851 Activated protein C resistance: Secondary | ICD-10-CM | POA: Insufficient documentation

## 2010-08-28 MED ORDER — ENOXAPARIN SODIUM 120 MG/0.8ML ~~LOC~~ SOLN: 120.0000 mg | SUBCUTANEOUS | Status: DC

## 2010-08-28 NOTE — Patient Instructions (Addendum)
We will contact you about Holding your coumadin and bridging with Lovenox for your procedure on 09/04/2010.  Gastroesophageal Reflux Disease (GERD) Your caregiver has diagnosed your chest discomfort as caused by gastroesophageal reflux disease (GERD). GERD is caused by a reflux of acid from your stomach into the digestive tube between your mouth and stomach (esophagus). Acid in contact with the esophagus causes soreness (inflammation) resulting in heartburn or chest pain. It may cause small holes in the lining of the esophagus (ulcers). CAUSES  Increased body weight puts pressure on the stomach, making acid rise.   Smoking increases acid production.   Alcohol decreases pressure on the valve between the stomach and esophagus (lower esophageal sphincter), allowing acid from the stomach into the esophagus.   Late evening meals and a full stomach increase pressure and acid production.   Lower esophageal sphincter is malformed.   Sometimes, no reason is found.  HOME CARE INSTRUCTIONS  Change the factors that you can control. Weight, smoking, or alcohol changes may be difficult to change on your own. Your caregiver can provide guidance and medical therapy.   Raising the head of your bed may help you to sleep.   Over-the-counter medicines will decrease acid production. Your caregiver can also prescribe medicines for this. Only take over-the-counter or prescription medicines for pain, discomfort, or fever as directed by your caregiver.   1/2 to 1 teaspoon of an antacid taken every hour while awake, with meals, and at bedtime, can neutralize acid.   DO NOT take aspirin, ibuprofen, or other nonsteroidal anti-inflammatory drugs (NSAIDs).  SEEK IMMEDIATE MEDICAL CARE IF:  The pain changes in location (radiates into arms, neck, jaw, teeth, or back), intensity, or duration.   You start feeling sick to your stomach (nauseous), start throwing up (vomiting), or sweating (diaphoresis).   You develop  left arm or jaw pain.   You develop pain going into your back, shortness of breath, or you pass out.   There is vomiting of fluid that is green, yellow, or looks like coffee grounds or blood.  These symptoms could signal other problems, such as heart disease. MAKE SURE YOU:  Understand these instructions.   Will watch your condition.   Will get help right away if you are not doing well or get worse.  Document Released: 09/30/2004 Document Re-Released: 03/17/2009 [facility] Patient Information 2011 [place], [place].

## 2010-08-28 NOTE — Progress Notes (Signed)
History of Present Illness:  This is a very nice 50 year old Caucasian male for the courtesy of Dr. Marga Melnick for evaluation of several years of recurrent acid reflux manifested by regurgitation and burning substernal chest pain. 10 years ago he was scheduled for endoscopy but this was not completed. He continues with periodic acid reflux which is relieved partially by Prilosec 20 mg a day, and daily vinegar. The patient has a factor V deficiency and is chronically on Coumadin with an INR between 2.5 and 3.5. His main complaint today is a globus sensation in his laryngeal area, but he also has" slow swallowing", but has had no history of food impactions. He denies abuse or use of alcohol, cigarettes, or NSAIDs. Previously he was evaluated by Dr.Granfortuna/ hematology, and he recommended a Lovenox bridge if the patient is off Coumadin for procedures. He apparently has tolerated this regime in the past for orthopedic surgery.  His been no anorexia, weight loss, nausea vomiting or hepatobiliary complaints. He denies lower gastrointestinal problems, and he is aware that he needs colonoscopy, but he wants to that elsewhere because of cost constraints.  I have reviewed this patient's present history, medical and surgical past history, allergies and medications.     ROS: The remainder of the 10 point ROS is negative... specifically denies change in bowels, melena, hematochezia, or any cardiovascular or pulmonary complaints. He exercises regularly, and follows a healthy fiber diet.  Past Medical History  Diagnosis Date  . Hyperlipemia   . Elevated homocysteine   . Benign prostatic hypertrophy   . Torn ACL 2008    No surgery  . Factor 5 Leiden mutation, heterozygous    Past Surgical History  Procedure Date  . Vasectomy 1988  . Knee surgery 2003    for cartilage, right    reports that he has been smoking Cigarettes.  He has never used smokeless tobacco. He reports that he does not drink alcohol  or use illicit drugs. family history includes Deep vein thrombosis in his father; Diabetes in his brother and father; Heart attack (age of onset:68) in his father; Heart disease in his mother; Hyperlipidemia in his mother; Hypertension in his mother; Pulmonary embolism in his maternal grandfather; and Uterine cancer in his mother. Allergies  Allergen Reactions  . Codeine     nausea  . Niacin     REACTION: rash, itching, hot        Physical Exam: General well developed well nourished patient in no acute distress, appearing his stated age Eyes PERRLA, no icterus, fundoscopic exam per opthamologist Skin no lesions noted Neck supple, no adenopathy, no thyroid enlargement, no tenderness Chest clear to percussion and auscultation Heart no significant murmurs, gallops or rubs noted Abdomen no hepatosplenomegaly masses or tenderness, BS normal. . Extremities no acute joint lesions, edema, phlebitis or evidence of cellulitis. Neurologic patient oriented x 3, cranial nerves intact, no focal neurologic deficits noted. Psychological mental status normal and normal affect.  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 He definitely needs endoscopic exam for diagnostic and therapeutic purposes. This has been scheduled but the patient off Coumadin for 5 days with a Lovenox bridge as previously recommended. I will send this note to Dr. Alwyn Ren and to the Coumadin clinic to be sure they agree with this plan. As mentioned above, he has a factor V homozygous deficiency with recurrent deep vein thromboses and a history of pulmonary emboli. He currently is asymptomatic  in terms of any cardiovascular or pulmonary complaints, and has no history currently suggestive of recurrent phlebitis in his legs. He does need colonoscopy screening, but he most to put that off for now pending evaluation of this problem. I think  that his main problem is a globus sensation related to GERD, and he may need stronger PPI acid suppression. The risk and benefits of these procedures have been explained in detail and he is agreed to proceed as planned.  Encounter Diagnosis  Name Primary?  Marland Kitchen Dysphagia Yes

## 2010-08-28 NOTE — Telephone Encounter (Signed)
Spoke with Hulan Saas at Dr. Norval Gable office this morning.  Pt scheduled for endoscopy on 8/31.  Will need Lovenox bridge due to factor V deficiency. Letter from Dr. Cyndie Chime in 2003 suggested Lovenox 1.5 mg/kg/day bridge.  Recent SCr was 0.9.  Okay for 1.5mg /kg/day dosing.

## 2010-08-28 NOTE — Telephone Encounter (Signed)
Spoke with pt.  Given following instructions for Lovenox bridge.  Rx sent to [facility].    8/24- Coumadin 5mg  daily 8/25- Coumadin 5mg  daily 8/26- Coumadin 5mg  daily 8/27- No Coumadin 8/28- Lovenox 120mg  once daily in AM 8/29- Lovenox 120mg  once daily in AM 8/30- Lovenox 120mg  once daily in AM 8/31- Day of Procedure.  If okay with MD, restart Coumadin and Lovenox that pm.  Take Coumadin 7.5mg  x 2 days and then resume 5mg  daily.  Continue Lovenox 120mg  once daily until INR therapeutic.  9/5- Recheck INR at 11am

## 2010-09-02 ENCOUNTER — Telehealth: Payer: Self-pay

## 2010-09-02 MED ORDER — ROSUVASTATIN CALCIUM 20 MG PO TABS: 20.0000 mg | ORAL_TABLET | Freq: Every day | ORAL | Status: DC

## 2010-09-02 NOTE — Telephone Encounter (Signed)
Spoke with patient, patient aware of med change, 3 weeks worth of samples and RX placed at the front for pick-up

## 2010-09-02 NOTE — Telephone Encounter (Signed)
20 mg ; recheck lipids, hepatic panel after 10 weeks of change (272.4, 995.20)

## 2010-09-02 NOTE — Telephone Encounter (Signed)
Message left on triage voicemail: Patient's insurance will no longer cover vytorin. Patient would like alternative (if Dr.Hopper agree's to changing med)

## 2010-09-02 NOTE — Telephone Encounter (Signed)
Please confirm your formulary options to Vytorin. Crestor would be optimal as a substitute. (copied from 07/15/10-lab append)  Dr.Hopper please advise on dose of Crestor you would like for patient to change to

## 2010-09-04 ENCOUNTER — Ambulatory Visit (AMBULATORY_SURGERY_CENTER): Payer: BC Managed Care – PPO | Admitting: Gastroenterology

## 2010-09-04 ENCOUNTER — Encounter: Payer: Self-pay | Admitting: Gastroenterology

## 2010-09-04 DIAGNOSIS — F458 Other somatoform disorders: Secondary | ICD-10-CM

## 2010-09-04 DIAGNOSIS — R0989 Other specified symptoms and signs involving the circulatory and respiratory systems: Secondary | ICD-10-CM

## 2010-09-04 DIAGNOSIS — R1310 Dysphagia, unspecified: Secondary | ICD-10-CM

## 2010-09-04 DIAGNOSIS — K219 Gastro-esophageal reflux disease without esophagitis: Secondary | ICD-10-CM

## 2010-09-04 MED ORDER — SODIUM CHLORIDE 0.9 % IV SOLN: 500.0000 mL | INTRAVENOUS | Status: DC

## 2010-09-04 NOTE — Patient Instructions (Addendum)
Green and blue discharge instructions reviewed with patient and care partner.  Impression/recommendations:  Normal endoscopy. The esophagus was dilated during your procedure today. Please follow the post dilation diet instructions this evening.  Manometry and ph probe test will be scheduled by Dr. Norval Gable office. Please call the office if you have not heard about a scheduled appointment by Wednesday September 09, 2010.  Continue Lovenox for an additional 3 days. Restart coumadin as you were taking it prior to your procedure. Continue other medications as you were taking them prior to your procedure.

## 2010-09-08 ENCOUNTER — Telehealth: Payer: Self-pay | Admitting: *Deleted

## 2010-09-08 NOTE — Telephone Encounter (Signed)
No i.d. On machine, no message left 

## 2010-09-09 ENCOUNTER — Telehealth: Payer: Self-pay | Admitting: *Deleted

## 2010-09-09 ENCOUNTER — Ambulatory Visit (INDEPENDENT_AMBULATORY_CARE_PROVIDER_SITE_OTHER): Payer: BC Managed Care – PPO | Admitting: *Deleted

## 2010-09-09 DIAGNOSIS — I2699 Other pulmonary embolism without acute cor pulmonale: Secondary | ICD-10-CM

## 2010-09-09 DIAGNOSIS — I82409 Acute embolism and thrombosis of unspecified deep veins of unspecified lower extremity: Secondary | ICD-10-CM

## 2010-09-09 LAB — POCT INR: INR: 2.3

## 2010-09-09 NOTE — Telephone Encounter (Signed)
Spoke with pt to inform him per his Endo , Dr Jarold Motto would like him to have an Esophageal Manometry and 24hr Ph Probe test. Pt is scheduled for 09/21/10 at 11:30am at [facility], booking # [phone] with Sue Lush.  Faxed order to [facility] ENDO [phone] and mailed pt his instructions. Pt stated understanding.

## 2010-09-10 ENCOUNTER — Other Ambulatory Visit: Payer: Self-pay

## 2010-09-10 ENCOUNTER — Other Ambulatory Visit: Payer: Self-pay | Admitting: Gastroenterology

## 2010-09-14 ENCOUNTER — Telehealth: Payer: Self-pay | Admitting: Gastroenterology

## 2010-09-14 NOTE — Telephone Encounter (Signed)
Notified pt it's ok to cancel his Manometry; call us when needed. Notified Noreene Larsson I wanted to cancel the Manometry- done 09/21/10 at 1130am.

## 2010-09-14 NOTE — Telephone Encounter (Signed)
Pt seen on 08/28/10 for recurrent years of GERD and Burning Substernal CP,a Globus Sensation in throat and " slow swallowing". 09/04/10 pt had EGD with [facility] dilation that was normal otherwise and Manometry was ordered. Pt reports he's had no problem with swallowing nor any globus sensation in his throat. Pt reports no problem now and he really can't afford the co pay for a manometry. Ok to cancel Manometry? Thanks.

## 2010-09-14 NOTE — Telephone Encounter (Signed)
Noted  

## 2010-09-15 ENCOUNTER — Encounter: Payer: BC Managed Care – PPO | Admitting: *Deleted

## 2010-09-29 ENCOUNTER — Other Ambulatory Visit: Payer: Self-pay | Admitting: Internal Medicine

## 2010-09-30 MED ORDER — ROSUVASTATIN CALCIUM 20 MG PO TABS: 20.0000 mg | ORAL_TABLET | Freq: Every day | ORAL | Status: DC

## 2010-09-30 NOTE — Telephone Encounter (Signed)
Spoke with patient, per patient- ok to send rx in for #90 at a time

## 2010-10-06 ENCOUNTER — Ambulatory Visit (INDEPENDENT_AMBULATORY_CARE_PROVIDER_SITE_OTHER): Payer: BC Managed Care – PPO | Admitting: *Deleted

## 2010-10-06 ENCOUNTER — Telehealth: Payer: Self-pay | Admitting: *Deleted

## 2010-10-06 DIAGNOSIS — I2699 Other pulmonary embolism without acute cor pulmonale: Secondary | ICD-10-CM

## 2010-10-06 DIAGNOSIS — I82409 Acute embolism and thrombosis of unspecified deep veins of unspecified lower extremity: Secondary | ICD-10-CM

## 2010-10-06 NOTE — Telephone Encounter (Signed)
Vytorin 10/40 could not get him to goal. The Crestor has been effective allowing goal attainment @ a lower dose with less toxicity. Based on advanced cholesterol testing this is the appropriate drug for this patient. Vytorin contains Simvastatin; increasing simvastatin to higher dose is contraindicated because of potential interaction with other medications he is taking.

## 2010-10-06 NOTE — Telephone Encounter (Signed)
Pt must have tried and failed at least one generically available statin: lovastatin,pravastatin, simvastatin or atorvastatin. The only med Pt has taken is Vytorin10-40 Please advise

## 2010-10-08 ENCOUNTER — Encounter: Payer: Self-pay | Admitting: *Deleted

## 2010-10-08 NOTE — Telephone Encounter (Signed)
PA faxed awaiting response 

## 2010-10-09 NOTE — Telephone Encounter (Signed)
Prior auth approved pharmacy notified via fax, approval letter scan

## 2010-11-03 ENCOUNTER — Ambulatory Visit (INDEPENDENT_AMBULATORY_CARE_PROVIDER_SITE_OTHER): Payer: BC Managed Care – PPO | Admitting: *Deleted

## 2010-11-03 DIAGNOSIS — I2699 Other pulmonary embolism without acute cor pulmonale: Secondary | ICD-10-CM

## 2010-11-03 DIAGNOSIS — I82409 Acute embolism and thrombosis of unspecified deep veins of unspecified lower extremity: Secondary | ICD-10-CM

## 2010-12-01 ENCOUNTER — Ambulatory Visit (INDEPENDENT_AMBULATORY_CARE_PROVIDER_SITE_OTHER): Payer: BC Managed Care – PPO | Admitting: *Deleted

## 2010-12-01 DIAGNOSIS — I82409 Acute embolism and thrombosis of unspecified deep veins of unspecified lower extremity: Secondary | ICD-10-CM

## 2010-12-01 DIAGNOSIS — I2699 Other pulmonary embolism without acute cor pulmonale: Secondary | ICD-10-CM

## 2010-12-02 ENCOUNTER — Encounter: Payer: BC Managed Care – PPO | Admitting: *Deleted

## 2011-01-12 ENCOUNTER — Ambulatory Visit (INDEPENDENT_AMBULATORY_CARE_PROVIDER_SITE_OTHER): Payer: BC Managed Care – PPO | Admitting: *Deleted

## 2011-01-12 DIAGNOSIS — I2699 Other pulmonary embolism without acute cor pulmonale: Secondary | ICD-10-CM

## 2011-01-12 DIAGNOSIS — I82409 Acute embolism and thrombosis of unspecified deep veins of unspecified lower extremity: Secondary | ICD-10-CM

## 2011-01-12 LAB — POCT INR: INR: 2.9

## 2011-02-08 ENCOUNTER — Other Ambulatory Visit: Payer: Self-pay | Admitting: Internal Medicine

## 2011-02-22 ENCOUNTER — Other Ambulatory Visit: Payer: Self-pay

## 2011-02-22 ENCOUNTER — Telehealth: Payer: Self-pay | Admitting: Internal Medicine

## 2011-02-22 MED ORDER — WARFARIN SODIUM 5 MG PO TABS: ORAL_TABLET | ORAL | Status: DC

## 2011-02-22 NOTE — Telephone Encounter (Signed)
Opened in error. BC °

## 2011-02-23 ENCOUNTER — Ambulatory Visit (INDEPENDENT_AMBULATORY_CARE_PROVIDER_SITE_OTHER): Payer: BC Managed Care – PPO

## 2011-02-23 DIAGNOSIS — I2699 Other pulmonary embolism without acute cor pulmonale: Secondary | ICD-10-CM

## 2011-02-23 DIAGNOSIS — I82409 Acute embolism and thrombosis of unspecified deep veins of unspecified lower extremity: Secondary | ICD-10-CM

## 2011-02-23 NOTE — Progress Notes (Signed)
I can not sign 

## 2011-04-06 ENCOUNTER — Ambulatory Visit (INDEPENDENT_AMBULATORY_CARE_PROVIDER_SITE_OTHER): Payer: BC Managed Care – PPO

## 2011-04-06 DIAGNOSIS — I82409 Acute embolism and thrombosis of unspecified deep veins of unspecified lower extremity: Secondary | ICD-10-CM

## 2011-04-06 DIAGNOSIS — I2699 Other pulmonary embolism without acute cor pulmonale: Secondary | ICD-10-CM

## 2011-04-08 ENCOUNTER — Telehealth: Payer: Self-pay | Admitting: *Deleted

## 2011-04-08 NOTE — Telephone Encounter (Signed)
Go to WebMD for information about tinnitus. Avoid excess aspirin and excess noise exposure as these are most common causes. To my knowledge has never been associated with statins. "White noise" machine @ bedside can prevent tinnitus from affecting sleep. If symptoms persist or progress; ENT referral will be completed.

## 2011-04-08 NOTE — Telephone Encounter (Signed)
Pt called stating that at his last CPE you switched him from Vytorin to Crestor & he said the last three weeks he has had major ringing in his ears. Pt would like to know if this is a side effect from Crestor. Please advise.

## 2011-04-08 NOTE — Telephone Encounter (Signed)
Discussed with pt

## 2011-04-19 ENCOUNTER — Telehealth: Payer: Self-pay | Admitting: *Deleted

## 2011-04-19 NOTE — Telephone Encounter (Signed)
Pt states that he has been having some memory loss which he feels is associated with taking the crestor. Pt states that this is one of the side effect from taking this med. Pt is requesting that med be changed back to vytorin 10-40.Please advise

## 2011-04-19 NOTE — Telephone Encounter (Signed)
Please enter the presumed reaction under "allergies" for Crestor. Change back to Vytorin 10/40.

## 2011-04-20 MED ORDER — EZETIMIBE-SIMVASTATIN 10-40 MG PO TABS: 1.0000 | ORAL_TABLET | Freq: Every day | ORAL | Status: DC

## 2011-04-20 NOTE — Telephone Encounter (Signed)
Discuss with patient, Rx sent. 

## 2011-04-27 ENCOUNTER — Telehealth: Payer: Self-pay | Admitting: *Deleted

## 2011-04-27 NOTE — Telephone Encounter (Signed)
Prior Auth approved 04-23-11-01-16-14, pharmacy notified via fax, approval letter scan to chart.

## 2011-05-18 ENCOUNTER — Ambulatory Visit (INDEPENDENT_AMBULATORY_CARE_PROVIDER_SITE_OTHER): Payer: BC Managed Care – PPO

## 2011-05-18 DIAGNOSIS — I82409 Acute embolism and thrombosis of unspecified deep veins of unspecified lower extremity: Secondary | ICD-10-CM

## 2011-05-18 DIAGNOSIS — I2699 Other pulmonary embolism without acute cor pulmonale: Secondary | ICD-10-CM

## 2011-06-28 ENCOUNTER — Other Ambulatory Visit: Payer: Self-pay | Admitting: Internal Medicine

## 2011-06-28 NOTE — Telephone Encounter (Signed)
Refill done.  

## 2011-06-29 ENCOUNTER — Ambulatory Visit (INDEPENDENT_AMBULATORY_CARE_PROVIDER_SITE_OTHER): Payer: BC Managed Care – PPO | Admitting: *Deleted

## 2011-06-29 DIAGNOSIS — I2699 Other pulmonary embolism without acute cor pulmonale: Secondary | ICD-10-CM

## 2011-06-29 DIAGNOSIS — I82409 Acute embolism and thrombosis of unspecified deep veins of unspecified lower extremity: Secondary | ICD-10-CM

## 2011-06-29 LAB — POCT INR: INR: 4.1

## 2011-07-20 ENCOUNTER — Encounter: Payer: Self-pay | Admitting: Internal Medicine

## 2011-07-20 ENCOUNTER — Ambulatory Visit (INDEPENDENT_AMBULATORY_CARE_PROVIDER_SITE_OTHER): Payer: BC Managed Care – PPO

## 2011-07-20 ENCOUNTER — Ambulatory Visit (INDEPENDENT_AMBULATORY_CARE_PROVIDER_SITE_OTHER): Payer: BC Managed Care – PPO | Admitting: Internal Medicine

## 2011-07-20 VITALS — BP 124/78 | HR 53 | Temp 97.9°F | Resp 12 | Ht 70.0 in | Wt 186.0 lb

## 2011-07-20 DIAGNOSIS — H9313 Tinnitus, bilateral: Secondary | ICD-10-CM

## 2011-07-20 DIAGNOSIS — Z0000 Encounter for general adult medical examination without abnormal findings: Secondary | ICD-10-CM

## 2011-07-20 DIAGNOSIS — H9319 Tinnitus, unspecified ear: Secondary | ICD-10-CM

## 2011-07-20 DIAGNOSIS — I2699 Other pulmonary embolism without acute cor pulmonale: Secondary | ICD-10-CM

## 2011-07-20 DIAGNOSIS — I82409 Acute embolism and thrombosis of unspecified deep veins of unspecified lower extremity: Secondary | ICD-10-CM

## 2011-07-20 LAB — CBC WITH DIFFERENTIAL/PLATELET
Basophils Absolute: 0 10*3/uL (ref 0.0–0.1)
Eosinophils Relative: 1.6 % (ref 0.0–5.0)
Hemoglobin: 15.1 g/dL (ref 13.0–17.0)
Lymphocytes Relative: 36.6 % (ref 12.0–46.0)
Monocytes Relative: 7.8 % (ref 3.0–12.0)
Neutro Abs: 3.5 10*3/uL (ref 1.4–7.7)
RDW: 13.1 % (ref 11.5–14.6)
WBC: 6.5 10*3/uL (ref 4.5–10.5)

## 2011-07-20 LAB — POCT INR: INR: 3.5

## 2011-07-20 NOTE — Patient Instructions (Addendum)
Preventive Health Care: Exercise at least 30-45 minutes a day,  3-4 days a week.  Eat a low-fat diet with lots of fruits and non leafy green vegetables, up to 7-9 servings per day.  Consume less than 40 grams of sugar per day from foods & drinks with High Fructose Corn Sugar as # 1,2,3 or # 4 on label. Health Care Power of Attorney & Living Will. Complete if not in place ; these place you in charge of your health care decisions. As per the Standard of Care , screening Colonoscopy recommended @ 50 & every 5-10 years thereafter . More frequent monitor would be dictated by family history or findings @ Colonoscopy  Please try to go on My Chart within the next 24 hours to allow me to release the results directly to you.

## 2011-07-20 NOTE — Progress Notes (Signed)
  Subjective:    Patient ID: Evan Cabrera, male    DOB: 1960/07/15, 51 y.o.   MRN: [id number]  HPI  Evan Cabrera is here for a physical;acute issues include tinnitus bilaterally.      Review of Systems He's had tinnitus bilaterally for 4 months; there was no definite prodrome or trigger for this. Specifically denies any significant upper respiratory  tract infection or noise exposure. He obviously does not ingest excess aspirin as he is on Coumadin permanently. He does use earplugs when he mows the lawn. He has had some decreased hearing on the left.  He has had an esophageal dilation for dysphagia. He is not had a colonoscopy as of yet; but he plans to schedule this in the near future in [place] @ [facility]. He denies abdominal pain, melena, or rectal bleeding. Standard of care reviewed     Objective:   Physical Exam Gen.: Healthy and well-nourished in appearance. Alert, appropriate and cooperative throughout exam. Head: Normocephalic without obvious abnormalities; no alopecia  Eyes: No corneal or conjunctival inflammation noted. Pupils equal round reactive to light and accommodation. Fundal exam is benign without hemorrhages, exudate, papilledema. Extraocular motion intact. Vision grossly normal. Ears: External  ear exam reveals no significant lesions or deformities. Canals clear .TMs normal. Hearing is grossly normal bilaterally to whisper @ 6 feet. Nose: External nasal exam reveals no deformity or inflammation. Nasal mucosa are pink and moist. No lesions or exudates noted.   Mouth: Oral mucosa and oropharynx reveal no lesions or exudates. Teeth in good repair. Neck: No deformities, masses, or tenderness noted. Range of motion & Thyroid normal. Lungs: Normal respiratory effort; chest expands symmetrically. Lungs are clear to auscultation without rales, wheezes, or increased work of breathing. Heart: Normal rate and rhythm. Normal S1 and S2. No gallop, click, or rub. S4 w/o   murmur. Abdomen: Bowel sounds normal; abdomen soft and nontender. No masses, organomegaly or hernias noted. Genitalia/ DRE: Genitalia normal except for left varices Prostate is normal without enlargement, asymmetry, nodularity, or induration.                     Musculoskeletal/extremities: No deformity or scoliosis noted of  the thoracic or lumbar spine. No clubbing, cyanosis, edema, or deformity noted. Range of motion  normal .Tone & strength  normal.Joints normal. Nail health  good. Vascular: Carotid, radial artery, dorsalis pedis and  posterior tibial pulses are full and equal. No bruits present. Neurologic: Alert and oriented x3. Deep tendon reflexes symmetrical and normal.          Skin: Intact without suspicious lesions or rashes. Lymph: No cervical, axillary, or inguinal lymphadenopathy present. Psych: Mood and affect are normal. Normally interactive                                                                                         Assessment & Plan:  #1 comprehensive physical exam; no acute findings #2 see Problem List with Assessments & Recommendations Plan: see Orders

## 2011-07-21 LAB — BASIC METABOLIC PANEL
CO2: 27 mEq/L (ref 19–32)
Chloride: 105 mEq/L (ref 96–112)
Sodium: 142 mEq/L (ref 135–145)

## 2011-07-21 LAB — LDL CHOLESTEROL, DIRECT: Direct LDL: 115.9 mg/dL

## 2011-07-21 LAB — HEPATIC FUNCTION PANEL
AST: 29 U/L (ref 0–37)
Albumin: 4.4 g/dL (ref 3.5–5.2)
Alkaline Phosphatase: 42 U/L (ref 39–117)
Total Protein: 7.2 g/dL (ref 6.0–8.3)

## 2011-07-21 LAB — LIPID PANEL: Cholesterol: 194 mg/dL (ref 0–200)

## 2011-08-24 ENCOUNTER — Ambulatory Visit (INDEPENDENT_AMBULATORY_CARE_PROVIDER_SITE_OTHER): Payer: BC Managed Care – PPO | Admitting: Pharmacist

## 2011-08-24 ENCOUNTER — Telehealth: Payer: Self-pay | Admitting: Internal Medicine

## 2011-08-24 DIAGNOSIS — Z7901 Long term (current) use of anticoagulants: Secondary | ICD-10-CM

## 2011-08-24 DIAGNOSIS — I82409 Acute embolism and thrombosis of unspecified deep veins of unspecified lower extremity: Secondary | ICD-10-CM

## 2011-08-24 DIAGNOSIS — D682 Hereditary deficiency of other clotting factors: Secondary | ICD-10-CM

## 2011-08-24 DIAGNOSIS — I2699 Other pulmonary embolism without acute cor pulmonale: Secondary | ICD-10-CM

## 2011-08-24 LAB — POCT INR: INR: 4.2

## 2011-08-24 MED ORDER — WARFARIN SODIUM 5 MG PO TABS: ORAL_TABLET | ORAL | Status: DC

## 2011-08-24 NOTE — Telephone Encounter (Signed)
Patient's PT/INR is monitored by coumadin clinic

## 2011-08-24 NOTE — Telephone Encounter (Signed)
Refill: warfarin 5mg  tablet. Take as directed by the coumadin clinic. Qty 45. Last fill 07-06-11

## 2011-09-14 ENCOUNTER — Ambulatory Visit (INDEPENDENT_AMBULATORY_CARE_PROVIDER_SITE_OTHER): Payer: BC Managed Care – PPO

## 2011-09-14 DIAGNOSIS — D682 Hereditary deficiency of other clotting factors: Secondary | ICD-10-CM

## 2011-09-14 DIAGNOSIS — I82409 Acute embolism and thrombosis of unspecified deep veins of unspecified lower extremity: Secondary | ICD-10-CM

## 2011-09-14 DIAGNOSIS — Z7901 Long term (current) use of anticoagulants: Secondary | ICD-10-CM

## 2011-09-14 DIAGNOSIS — I2699 Other pulmonary embolism without acute cor pulmonale: Secondary | ICD-10-CM

## 2011-10-05 HISTORY — PX: COLONOSCOPY: SHX174

## 2011-10-12 ENCOUNTER — Ambulatory Visit (INDEPENDENT_AMBULATORY_CARE_PROVIDER_SITE_OTHER): Payer: BC Managed Care – PPO

## 2011-10-12 DIAGNOSIS — Z7901 Long term (current) use of anticoagulants: Secondary | ICD-10-CM

## 2011-10-12 DIAGNOSIS — D682 Hereditary deficiency of other clotting factors: Secondary | ICD-10-CM

## 2011-10-12 DIAGNOSIS — I2699 Other pulmonary embolism without acute cor pulmonale: Secondary | ICD-10-CM

## 2011-10-12 DIAGNOSIS — I82409 Acute embolism and thrombosis of unspecified deep veins of unspecified lower extremity: Secondary | ICD-10-CM

## 2011-10-12 LAB — POCT INR: INR: 3.2

## 2011-10-12 MED ORDER — ENOXAPARIN SODIUM 120 MG/0.8ML ~~LOC~~ SOLN: 120.0000 mg | Freq: Every day | SUBCUTANEOUS | Status: DC

## 2011-10-12 NOTE — Patient Instructions (Signed)
10/19/11 Take your last dosage of Coumadin. 10/20/11 No Coumadin, No Lovenox 10/21/11 Start taking Lovenox 120mg  once daily in the am. 10/22/11 Take Lovenox 120mg  once daily in am. 10/23/11 Take Lovenox 120mg  once daily in am. 10/24/11 Take last dosage of Lovenox 120mg  once in the am prior to procedure on 10/25/11.  Resume Coumadin post procedure per MD instruction, once ok to restart take an extra 1/2 tablet x 2 doses, then resume same dosage 1 tablet daily.  Resume Lovenox 120mg  once daily once ok to restart by MD, continue daily until recheck INR is greater than 2.0.

## 2011-10-25 LAB — HM COLONOSCOPY

## 2011-10-29 ENCOUNTER — Ambulatory Visit (INDEPENDENT_AMBULATORY_CARE_PROVIDER_SITE_OTHER): Payer: BC Managed Care – PPO | Admitting: Pharmacist

## 2011-10-29 DIAGNOSIS — D682 Hereditary deficiency of other clotting factors: Secondary | ICD-10-CM

## 2011-10-29 DIAGNOSIS — I2699 Other pulmonary embolism without acute cor pulmonale: Secondary | ICD-10-CM

## 2011-10-29 DIAGNOSIS — I82409 Acute embolism and thrombosis of unspecified deep veins of unspecified lower extremity: Secondary | ICD-10-CM

## 2011-10-29 DIAGNOSIS — Z7901 Long term (current) use of anticoagulants: Secondary | ICD-10-CM

## 2011-10-29 LAB — POCT INR: INR: 1.5

## 2011-11-01 ENCOUNTER — Encounter: Payer: Self-pay | Admitting: Internal Medicine

## 2011-11-01 ENCOUNTER — Ambulatory Visit (INDEPENDENT_AMBULATORY_CARE_PROVIDER_SITE_OTHER): Payer: BC Managed Care – PPO

## 2011-11-01 DIAGNOSIS — D682 Hereditary deficiency of other clotting factors: Secondary | ICD-10-CM

## 2011-11-01 DIAGNOSIS — I2699 Other pulmonary embolism without acute cor pulmonale: Secondary | ICD-10-CM

## 2011-11-01 DIAGNOSIS — Z7901 Long term (current) use of anticoagulants: Secondary | ICD-10-CM

## 2011-11-01 DIAGNOSIS — I82409 Acute embolism and thrombosis of unspecified deep veins of unspecified lower extremity: Secondary | ICD-10-CM

## 2011-11-02 ENCOUNTER — Telehealth: Payer: Self-pay | Admitting: *Deleted

## 2011-11-02 NOTE — Telephone Encounter (Signed)
Patient states he had one bloody stool last night, bright red with clots, this morning has had 2 more stools with bright red blood, he had a visit yesterday in the coumadin clinic and had an INR of 2.3. I referred him to the MD that did his colonoscopy [facility] in [place], he will call them this morning. Any further changes in his condition or instructions from MD to hold coumadin he is to call coumadin clinic and inform us.

## 2011-11-22 ENCOUNTER — Encounter: Payer: Self-pay | Admitting: Internal Medicine

## 2011-11-29 ENCOUNTER — Ambulatory Visit (INDEPENDENT_AMBULATORY_CARE_PROVIDER_SITE_OTHER): Payer: BC Managed Care – PPO | Admitting: *Deleted

## 2011-11-29 DIAGNOSIS — I2699 Other pulmonary embolism without acute cor pulmonale: Secondary | ICD-10-CM

## 2011-11-29 DIAGNOSIS — Z7901 Long term (current) use of anticoagulants: Secondary | ICD-10-CM

## 2011-11-29 DIAGNOSIS — I82409 Acute embolism and thrombosis of unspecified deep veins of unspecified lower extremity: Secondary | ICD-10-CM

## 2011-11-29 DIAGNOSIS — D682 Hereditary deficiency of other clotting factors: Secondary | ICD-10-CM

## 2011-12-20 ENCOUNTER — Other Ambulatory Visit: Payer: Self-pay | Admitting: Internal Medicine

## 2012-01-11 ENCOUNTER — Ambulatory Visit (INDEPENDENT_AMBULATORY_CARE_PROVIDER_SITE_OTHER): Payer: BC Managed Care – PPO

## 2012-01-11 DIAGNOSIS — I2699 Other pulmonary embolism without acute cor pulmonale: Secondary | ICD-10-CM

## 2012-01-11 DIAGNOSIS — I82409 Acute embolism and thrombosis of unspecified deep veins of unspecified lower extremity: Secondary | ICD-10-CM

## 2012-01-11 DIAGNOSIS — Z7901 Long term (current) use of anticoagulants: Secondary | ICD-10-CM

## 2012-01-11 DIAGNOSIS — D682 Hereditary deficiency of other clotting factors: Secondary | ICD-10-CM

## 2012-02-19 ENCOUNTER — Other Ambulatory Visit: Payer: Self-pay

## 2012-02-22 ENCOUNTER — Ambulatory Visit (INDEPENDENT_AMBULATORY_CARE_PROVIDER_SITE_OTHER): Payer: BC Managed Care – PPO | Admitting: *Deleted

## 2012-02-22 DIAGNOSIS — I82409 Acute embolism and thrombosis of unspecified deep veins of unspecified lower extremity: Secondary | ICD-10-CM

## 2012-02-22 DIAGNOSIS — D682 Hereditary deficiency of other clotting factors: Secondary | ICD-10-CM

## 2012-02-22 DIAGNOSIS — Z7901 Long term (current) use of anticoagulants: Secondary | ICD-10-CM

## 2012-02-22 DIAGNOSIS — I2699 Other pulmonary embolism without acute cor pulmonale: Secondary | ICD-10-CM

## 2012-02-22 NOTE — Addendum Note (Signed)
Addended by: Raul Del on: 02/22/2012 09:58 AM   Modules accepted: Orders

## 2012-02-28 ENCOUNTER — Other Ambulatory Visit: Payer: Self-pay | Admitting: Internal Medicine

## 2012-02-28 NOTE — Telephone Encounter (Signed)
PT/INR monitored by coumadin clinic

## 2012-04-04 ENCOUNTER — Ambulatory Visit (INDEPENDENT_AMBULATORY_CARE_PROVIDER_SITE_OTHER): Payer: BC Managed Care – PPO | Admitting: *Deleted

## 2012-04-04 DIAGNOSIS — I2699 Other pulmonary embolism without acute cor pulmonale: Secondary | ICD-10-CM

## 2012-04-04 DIAGNOSIS — I82409 Acute embolism and thrombosis of unspecified deep veins of unspecified lower extremity: Secondary | ICD-10-CM

## 2012-04-04 DIAGNOSIS — Z7901 Long term (current) use of anticoagulants: Secondary | ICD-10-CM

## 2012-04-04 DIAGNOSIS — D682 Hereditary deficiency of other clotting factors: Secondary | ICD-10-CM

## 2012-04-04 LAB — POCT INR: INR: 3.5

## 2012-05-16 ENCOUNTER — Ambulatory Visit (INDEPENDENT_AMBULATORY_CARE_PROVIDER_SITE_OTHER): Payer: BC Managed Care – PPO | Admitting: *Deleted

## 2012-05-16 DIAGNOSIS — D682 Hereditary deficiency of other clotting factors: Secondary | ICD-10-CM

## 2012-05-16 DIAGNOSIS — I82409 Acute embolism and thrombosis of unspecified deep veins of unspecified lower extremity: Secondary | ICD-10-CM

## 2012-05-16 DIAGNOSIS — I2699 Other pulmonary embolism without acute cor pulmonale: Secondary | ICD-10-CM

## 2012-05-16 DIAGNOSIS — Z7901 Long term (current) use of anticoagulants: Secondary | ICD-10-CM

## 2012-05-16 LAB — POCT INR: INR: 2.8

## 2012-06-12 ENCOUNTER — Other Ambulatory Visit: Payer: Self-pay | Admitting: Internal Medicine

## 2012-06-12 NOTE — Telephone Encounter (Signed)
Lipid/Hep 272.4/995.20  

## 2012-06-27 ENCOUNTER — Ambulatory Visit (INDEPENDENT_AMBULATORY_CARE_PROVIDER_SITE_OTHER): Payer: BC Managed Care – PPO | Admitting: *Deleted

## 2012-06-27 DIAGNOSIS — D682 Hereditary deficiency of other clotting factors: Secondary | ICD-10-CM

## 2012-06-27 DIAGNOSIS — Z7901 Long term (current) use of anticoagulants: Secondary | ICD-10-CM

## 2012-06-27 DIAGNOSIS — I82409 Acute embolism and thrombosis of unspecified deep veins of unspecified lower extremity: Secondary | ICD-10-CM

## 2012-06-27 DIAGNOSIS — I2699 Other pulmonary embolism without acute cor pulmonale: Secondary | ICD-10-CM

## 2012-06-27 LAB — POCT INR: INR: 3

## 2012-08-08 ENCOUNTER — Ambulatory Visit (INDEPENDENT_AMBULATORY_CARE_PROVIDER_SITE_OTHER): Payer: BC Managed Care – PPO | Admitting: *Deleted

## 2012-08-08 DIAGNOSIS — I2699 Other pulmonary embolism without acute cor pulmonale: Secondary | ICD-10-CM

## 2012-08-08 DIAGNOSIS — D682 Hereditary deficiency of other clotting factors: Secondary | ICD-10-CM

## 2012-08-08 DIAGNOSIS — I82409 Acute embolism and thrombosis of unspecified deep veins of unspecified lower extremity: Secondary | ICD-10-CM

## 2012-08-08 DIAGNOSIS — Z7901 Long term (current) use of anticoagulants: Secondary | ICD-10-CM

## 2012-08-24 ENCOUNTER — Other Ambulatory Visit: Payer: Self-pay | Admitting: *Deleted

## 2012-08-24 MED ORDER — WARFARIN SODIUM 5 MG PO TABS: 5.0000 mg | ORAL_TABLET | ORAL | Status: DC

## 2012-08-28 ENCOUNTER — Other Ambulatory Visit: Payer: Self-pay | Admitting: Internal Medicine

## 2012-09-18 ENCOUNTER — Ambulatory Visit (INDEPENDENT_AMBULATORY_CARE_PROVIDER_SITE_OTHER): Payer: BC Managed Care – PPO | Admitting: Internal Medicine

## 2012-09-18 ENCOUNTER — Encounter: Payer: Self-pay | Admitting: Internal Medicine

## 2012-09-18 VITALS — BP 119/72 | HR 84 | Temp 98.2°F | Ht 68.8 in | Wt 185.6 lb

## 2012-09-18 DIAGNOSIS — E785 Hyperlipidemia, unspecified: Secondary | ICD-10-CM

## 2012-09-18 DIAGNOSIS — Z860100 Personal history of colon polyps, unspecified: Secondary | ICD-10-CM | POA: Insufficient documentation

## 2012-09-18 DIAGNOSIS — Z0000 Encounter for general adult medical examination without abnormal findings: Secondary | ICD-10-CM

## 2012-09-18 DIAGNOSIS — D682 Hereditary deficiency of other clotting factors: Secondary | ICD-10-CM

## 2012-09-18 LAB — URIC ACID: Uric Acid, Serum: 6.3 mg/dL (ref 4.0–7.8)

## 2012-09-18 LAB — CBC WITH DIFFERENTIAL/PLATELET
Eosinophils Relative: 1.2 % (ref 0.0–5.0)
HCT: 44.2 % (ref 39.0–52.0)
Lymphocytes Relative: 32.4 % (ref 12.0–46.0)
Lymphs Abs: 2 10*3/uL (ref 0.7–4.0)
Monocytes Relative: 6.1 % (ref 3.0–12.0)
Platelets: 208 10*3/uL (ref 150.0–400.0)
WBC: 6.2 10*3/uL (ref 4.5–10.5)

## 2012-09-18 LAB — LIPID PANEL
Cholesterol: 166 mg/dL (ref 0–200)
LDL Cholesterol: 87 mg/dL (ref 0–99)
Total CHOL/HDL Ratio: 4

## 2012-09-18 LAB — BASIC METABOLIC PANEL
BUN: 15 mg/dL (ref 6–23)
Calcium: 8.9 mg/dL (ref 8.4–10.5)
GFR: 65.74 mL/min (ref >60.00)
Potassium: 4.3 mEq/L (ref 3.5–5.1)
Sodium: 139 mEq/L (ref 135–145)

## 2012-09-18 LAB — PROTIME-INR
INR: 3 ratio — ABNORMAL HIGH (ref 0.8–1.0)
Prothrombin Time: 30.6 s — ABNORMAL HIGH (ref 10.2–12.4)

## 2012-09-18 LAB — HEPATIC FUNCTION PANEL
ALT: 24 U/L (ref 0–53)
AST: 25 U/L (ref 0–37)
Alkaline Phosphatase: 41 U/L (ref 39–117)
Bilirubin, Direct: 0.1 mg/dL (ref 0.0–0.3)
Total Bilirubin: 0.5 mg/dL (ref 0.3–1.2)

## 2012-09-18 MED ORDER — EZETIMIBE-SIMVASTATIN 10-40 MG PO TABS: 1.0000 | ORAL_TABLET | Freq: Every day | ORAL | Status: DC

## 2012-09-18 NOTE — Progress Notes (Signed)
Subjective:    Patient ID: Evan Cabrera, male    DOB: 04/05/1960, 51 y.o.   MRN: [id number]  HPI  He is here for a physical;acute issues include pain in R foot.     Review of Systems He's had pain at the base of the fifth right toe laterally , described as sharp as throbbing.There was no specific injury or trigger for this. There is associated redness and swelling.  It is worse with ambulation & wearing shoes, even a EEE shoe width. Tiger balm helps temporarily. No PMH / FH gout.  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 Specifically he denies chest pain, palpitations, dyspnea, or claudication.  Family history is negative for premature coronary disease. Advanced cholesterol testing reveals his LDL goal is less than 100. There is medication compliance with the statin. Significant abdominal symptoms, memory deficit, or myalgias denied.     Objective:   Physical Exam Gen.: Healthy and well-nourished in appearance. Alert, appropriate and cooperative throughout exam.Appears younger than stated age  Head: Normocephalic without obvious abnormalities;  no alopecia  Eyes: No corneal or conjunctival inflammation noted. Pupils equal round reactive to light and accommodation.  Extraocular motion intact. Vision grossly normal without lenses Ears: External  ear exam reveals no significant lesions or deformities. Canals clear .TMs normal. Hearing is grossly normal bilaterally. Nose: External nasal exam reveals no deformity or inflammation. Nasal mucosa are pink and moist. No lesions or exudates noted.   Mouth: Oral mucosa and oropharynx reveal no lesions or exudates. Teeth in good repair. Neck: No deformities, masses, or tenderness noted. Range of motion & Thyroid normal. Lungs: Normal respiratory effort; chest expands symmetrically. Lungs are clear to auscultation without rales, wheezes, or increased work of breathing. Heart: Normal rate and rhythm.  Normal S1 and S2. No gallop, click, or rub. No murmur. Abdomen: Bowel sounds normal; abdomen soft and nontender. No masses, organomegaly or hernias noted. Genitalia: Genitalia normal except for left varices & vasectomy scar tissue. Prostate is normal without enlargement, asymmetry, nodularity, or induration.                                Musculoskeletal/extremities: No deformity or scoliosis noted of  the thoracic or lumbar spine.  No clubbing, cyanosis, edema, or significant extremity  deformity noted. Range of motion normal .Tone & strength  Normal. Joints normal except for tenderness @ base R 5th toe. Nail health good. Able to lie down & sit up w/o help. Negative SLR bilaterally Vascular: Carotid, radial artery, dorsalis pedis and  posterior tibial pulses are full and equal. No bruits present. Neurologic: Alert and oriented x3. Deep tendon reflexes symmetrical and normal.       Skin: Intact without suspicious lesions or rashes. Lymph: No cervical, axillary, or inguinal lymphadenopathy present. Psych: Mood and affect are normal. Normally interactive                                                                                        Assessment & Plan:  #1 comprehensive  physical exam; no acute findings  Plan: see Orders  & Recommendations

## 2012-09-18 NOTE — Patient Instructions (Addendum)
I recommend a Sports Medicine consultation to determine optimal therapy for the foot pain if uric acid is normal. Imaging may be of benefit also.

## 2012-10-09 ENCOUNTER — Ambulatory Visit (INDEPENDENT_AMBULATORY_CARE_PROVIDER_SITE_OTHER): Payer: BC Managed Care – PPO | Admitting: Internal Medicine

## 2012-10-09 ENCOUNTER — Encounter: Payer: Self-pay | Admitting: Internal Medicine

## 2012-10-09 VITALS — BP 148/70 | HR 67 | Temp 98.6°F | Wt 183.0 lb

## 2012-10-09 DIAGNOSIS — L255 Unspecified contact dermatitis due to plants, except food: Secondary | ICD-10-CM

## 2012-10-09 MED ORDER — MOMETASONE FUROATE 0.1 % EX OINT: TOPICAL_OINTMENT | Freq: Two times a day (BID) | CUTANEOUS | Status: DC

## 2012-10-09 MED ORDER — PREDNISONE 20 MG PO TABS: 20.0000 mg | ORAL_TABLET | Freq: Two times a day (BID) | ORAL | Status: DC

## 2012-10-09 NOTE — Progress Notes (Signed)
  Subjective:    Patient ID: Evan Cabrera, male    DOB: 12-04-1960, 52 y.o.   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 Despite using cortisone 10 the rash is progressing on both arms. Lye soap is controlling the itching.    Review of Systems  He denies any associated fever, chills, or sweats. There is no facial involvement or respiratory symptoms.     Objective:   Physical Exam Gen.: Healthy and well-nourished in appearance. Alert, appropriate and cooperative throughout exam.  Eyes: No corneal or conjunctival inflammation noted.  Mouth: Oral mucosa and oropharynx reveal no lesions or exudates. Teeth in good repair. Neck: No deformities, masses, or tenderness noted. Lungs: Normal respiratory effort; chest expands symmetrically. Lungs are clear to auscultation without rales, wheezes, or increased work of breathing. Heart: Normal rate and rhythm. Normal S1 and S2. No gallop, click, or rub. No murmur.  Neurologic: Alert and oriented x3.  Skin: Classic contact dermatitis lesions of upper arms Lymph: No cervical, axillary, or epitrochlear lymphadenopathy present. Psych: Mood and affect are normal. Normally interactive                                                                                        Assessment & Plan:  #1 rhus dermatitis See orders

## 2012-10-09 NOTE — Patient Instructions (Signed)
   If you could possibly have been exposed to poison ivy; immediately shower & use soap liberally to remove oil 

## 2012-10-31 ENCOUNTER — Ambulatory Visit (INDEPENDENT_AMBULATORY_CARE_PROVIDER_SITE_OTHER): Payer: BC Managed Care – PPO | Admitting: Pharmacist

## 2012-10-31 DIAGNOSIS — I2699 Other pulmonary embolism without acute cor pulmonale: Secondary | ICD-10-CM

## 2012-10-31 DIAGNOSIS — Z7901 Long term (current) use of anticoagulants: Secondary | ICD-10-CM

## 2012-10-31 DIAGNOSIS — D682 Hereditary deficiency of other clotting factors: Secondary | ICD-10-CM

## 2012-10-31 DIAGNOSIS — I82409 Acute embolism and thrombosis of unspecified deep veins of unspecified lower extremity: Secondary | ICD-10-CM

## 2012-11-09 ENCOUNTER — Other Ambulatory Visit: Payer: Self-pay

## 2012-12-05 ENCOUNTER — Ambulatory Visit (INDEPENDENT_AMBULATORY_CARE_PROVIDER_SITE_OTHER): Payer: BC Managed Care – PPO | Admitting: *Deleted

## 2012-12-05 DIAGNOSIS — Z7901 Long term (current) use of anticoagulants: Secondary | ICD-10-CM

## 2012-12-05 DIAGNOSIS — I82409 Acute embolism and thrombosis of unspecified deep veins of unspecified lower extremity: Secondary | ICD-10-CM

## 2012-12-05 DIAGNOSIS — I2699 Other pulmonary embolism without acute cor pulmonale: Secondary | ICD-10-CM

## 2012-12-05 DIAGNOSIS — D682 Hereditary deficiency of other clotting factors: Secondary | ICD-10-CM

## 2012-12-05 LAB — POCT INR: INR: 2.7

## 2013-01-16 ENCOUNTER — Ambulatory Visit (INDEPENDENT_AMBULATORY_CARE_PROVIDER_SITE_OTHER): Payer: BC Managed Care – PPO | Admitting: *Deleted

## 2013-01-16 DIAGNOSIS — D682 Hereditary deficiency of other clotting factors: Secondary | ICD-10-CM

## 2013-01-16 DIAGNOSIS — Z7901 Long term (current) use of anticoagulants: Secondary | ICD-10-CM

## 2013-01-16 DIAGNOSIS — I2699 Other pulmonary embolism without acute cor pulmonale: Secondary | ICD-10-CM

## 2013-01-16 DIAGNOSIS — I82409 Acute embolism and thrombosis of unspecified deep veins of unspecified lower extremity: Secondary | ICD-10-CM

## 2013-01-16 LAB — POCT INR: INR: 2.7

## 2013-02-23 ENCOUNTER — Other Ambulatory Visit: Payer: Self-pay | Admitting: *Deleted

## 2013-02-23 MED ORDER — WARFARIN SODIUM 5 MG PO TABS: 5.0000 mg | ORAL_TABLET | ORAL | Status: DC

## 2013-03-05 ENCOUNTER — Ambulatory Visit (INDEPENDENT_AMBULATORY_CARE_PROVIDER_SITE_OTHER): Payer: BC Managed Care – PPO | Admitting: Pharmacist

## 2013-03-05 DIAGNOSIS — I82409 Acute embolism and thrombosis of unspecified deep veins of unspecified lower extremity: Secondary | ICD-10-CM

## 2013-03-05 DIAGNOSIS — Z7901 Long term (current) use of anticoagulants: Secondary | ICD-10-CM

## 2013-03-05 DIAGNOSIS — I2699 Other pulmonary embolism without acute cor pulmonale: Secondary | ICD-10-CM

## 2013-03-05 DIAGNOSIS — D682 Hereditary deficiency of other clotting factors: Secondary | ICD-10-CM

## 2013-03-05 LAB — POCT INR: INR: 3.3

## 2013-04-13 ENCOUNTER — Encounter: Payer: Self-pay | Admitting: Internal Medicine

## 2013-04-13 ENCOUNTER — Ambulatory Visit (INDEPENDENT_AMBULATORY_CARE_PROVIDER_SITE_OTHER): Payer: BC Managed Care – PPO | Admitting: Internal Medicine

## 2013-04-13 VITALS — BP 128/80 | HR 64 | Temp 97.4°F | Resp 14 | Ht 68.0 in | Wt 187.0 lb

## 2013-04-13 DIAGNOSIS — M545 Low back pain, unspecified: Secondary | ICD-10-CM

## 2013-04-13 MED ORDER — CYCLOBENZAPRINE HCL 5 MG PO TABS: ORAL_TABLET | ORAL | Status: DC

## 2013-04-13 MED ORDER — TRAMADOL HCL 50 MG PO TABS: 50.0000 mg | ORAL_TABLET | Freq: Three times a day (TID) | ORAL | Status: DC | PRN

## 2013-04-13 NOTE — Patient Instructions (Signed)
The best exercises for the low back include freestyle swimming, stretch aerobics, and yoga.Cybex & Nautilus machines rather than dead weights are better for the back. 

## 2013-04-13 NOTE — Progress Notes (Signed)
   Subjective:    Patient ID: Evan Cabrera, male    DOB: 06/23/1960, 53 y.o.   MRN: [id number]  HPI    Review of Systems     Objective:   Physical Exam        Assessment & Plan:

## 2013-04-13 NOTE — Progress Notes (Signed)
   Subjective:    Patient ID: Evan Cabrera, male    DOB: [birth year], 53 y.o.   MRN: [id number]  HPI Clearing branches from bushes April 1. Lifted limbs things above head, moved items into truck all day. Sharp pain (ranges from 9/10 to 5/10) in lower back on each side of spine without radiation to legs.Does tend to radiate up to shoulder blades when pain is severe. No numbness/tingling in legs/feet. Pain is only relieved when sitting still. Has not taken NSAIDs or acetaminophen. Both heat and ice have helped.    Review of Systems  Musculoskeletal: Negative for gait problem and joint swelling.   Has been continuing with daily activities including exercise (did "planks" yesterday) No fecal or urinary incontinence.  No fever, sweats, fatigue, headaches, syncope.  No abdominal pain, diarrhea, or black, tarry stool.  No painful urination, blood in urine.    Objective:   Physical Exam  Constitutional: He is oriented to person, place, and time. He appears well-developed and well-nourished. No distress.  HENT:  Head: Normocephalic and atraumatic.  Eyes: Pupils are equal, round, and reactive to light. No scleral icterus.  Neck: Normal range of motion. Neck supple.  Cardiovascular: Normal rate, regular rhythm and normal heart sounds.   Pulmonary/Chest: Effort normal and breath sounds normal. No respiratory distress.  Abdominal: Soft. Bowel sounds are normal. He exhibits no distension and no mass. There is no tenderness.  Musculoskeletal: Normal range of motion. He exhibits no edema and no tenderness (Tenderness bilaterally lower back. ).  Lymphadenopathy:    He has no cervical adenopathy.  Neurological: He is alert and oriented to person, place, and time. He has normal reflexes. He displays normal reflexes. He exhibits normal muscle tone.  Normal sensation in legs/feet bilaterally.  Skin: Skin is warm and dry. No rash noted. He is not diaphoretic. No erythema (over lower back).   Classic low  back crawl. Normal heel walk. Chin to chest causes discomfort.       Assessment & Plan:  #1 bilateral muscle strain lower back due to repetitive motion.

## 2013-04-13 NOTE — Progress Notes (Signed)
   Subjective:    Patient ID: Evan Cabrera, male    DOB: Sep 12, 1960, 53 y.o.   MRN: [id number]  HPI Clearing branches from bushes April 1. Lifted limbs things above head, moved items into truck all day. Sharp pain (ranges from 9/10 to 5/10) in lower back on each side of spine without radiation to legs.Does tend to radiate up to shoulder blades when pain is severe. No numbness/tingling in legs/feet. Pain is only relieved when sitting still. Has not taken NSAIDs or acetaminophen  Review of Systems Musculoskeletal: Negative for gait problem and joint swelling.   Has been continuing with daily activities including exercise (did "planks" yesterday)  No fecal or urinary incontinence.  No fever, sweats, fatigue, headaches, syncope.  No abdominal pain, diarrhea, or black, tarry stool.  No painful urination, blood in urine.     Objective:   Physical Exam  Physical Exam  Constitutional: He is oriented to person, place, and time. He appears well-developed and well-nourished. No distress.  HENT:  Head: Normocephalic and atraumatic.  Eyes: Pupils are equal, round, and reactive to light. No scleral icterus.  Neck: Normal range of motion. Neck supple.  Cardiovascular: Normal rate, regular rhythm and normal heart sounds.  Pulmonary/Chest: Effort normal and breath sounds normal. No respiratory distress.  Abdominal: Soft. Bowel sounds are normal. He exhibits no distension and no mass. There is no tenderness.  Musculoskeletal: Normal range of motion. He exhibits no edema and no tenderness (Tenderness bilaterally lower back. ).  Lymphadenopathy:  He has no cervical adenopathy.  Neurological: He is alert and oriented to person, place, and time. He has normal reflexes. He displays normal reflexes. He exhibits normal muscle tone.  Normal sensation in legs/feet bilaterally.  Skin: Skin is warm and dry. No rash noted. He is not diaphoretic. No erythema (over lower back).   Classic low back crawl. Normal  heel and toe walk. Chin to chest causes discomfort.          Assessment & Plan:   Plan:   #1 bilateral muscle strain lower back due to repetitive motion.   Plan: see orders & AVS

## 2013-04-17 ENCOUNTER — Ambulatory Visit (INDEPENDENT_AMBULATORY_CARE_PROVIDER_SITE_OTHER): Payer: BC Managed Care – PPO

## 2013-04-17 DIAGNOSIS — I82409 Acute embolism and thrombosis of unspecified deep veins of unspecified lower extremity: Secondary | ICD-10-CM

## 2013-04-17 DIAGNOSIS — Z7901 Long term (current) use of anticoagulants: Secondary | ICD-10-CM

## 2013-04-17 DIAGNOSIS — D682 Hereditary deficiency of other clotting factors: Secondary | ICD-10-CM

## 2013-04-17 DIAGNOSIS — I2699 Other pulmonary embolism without acute cor pulmonale: Secondary | ICD-10-CM

## 2013-04-17 LAB — POCT INR: INR: 3.3

## 2013-04-22 ENCOUNTER — Other Ambulatory Visit: Payer: Self-pay | Admitting: Internal Medicine

## 2013-04-23 ENCOUNTER — Other Ambulatory Visit: Payer: Self-pay

## 2013-04-23 ENCOUNTER — Encounter: Payer: Self-pay | Admitting: Internal Medicine

## 2013-04-23 DIAGNOSIS — M545 Low back pain, unspecified: Secondary | ICD-10-CM

## 2013-04-23 MED ORDER — TRAMADOL HCL 50 MG PO TABS: 50.0000 mg | ORAL_TABLET | Freq: Three times a day (TID) | ORAL | Status: DC | PRN

## 2013-04-23 MED ORDER — CYCLOBENZAPRINE HCL 5 MG PO TABS: ORAL_TABLET | ORAL | Status: DC

## 2013-05-29 ENCOUNTER — Ambulatory Visit (INDEPENDENT_AMBULATORY_CARE_PROVIDER_SITE_OTHER): Payer: BC Managed Care – PPO | Admitting: Pharmacist

## 2013-05-29 DIAGNOSIS — D682 Hereditary deficiency of other clotting factors: Secondary | ICD-10-CM

## 2013-05-29 DIAGNOSIS — I2699 Other pulmonary embolism without acute cor pulmonale: Secondary | ICD-10-CM

## 2013-05-29 DIAGNOSIS — I82409 Acute embolism and thrombosis of unspecified deep veins of unspecified lower extremity: Secondary | ICD-10-CM

## 2013-05-29 DIAGNOSIS — Z7901 Long term (current) use of anticoagulants: Secondary | ICD-10-CM

## 2013-05-29 LAB — POCT INR: INR: 4.9

## 2013-06-13 ENCOUNTER — Ambulatory Visit (INDEPENDENT_AMBULATORY_CARE_PROVIDER_SITE_OTHER): Payer: BC Managed Care – PPO | Admitting: *Deleted

## 2013-06-13 DIAGNOSIS — Z7901 Long term (current) use of anticoagulants: Secondary | ICD-10-CM

## 2013-06-13 DIAGNOSIS — I82409 Acute embolism and thrombosis of unspecified deep veins of unspecified lower extremity: Secondary | ICD-10-CM

## 2013-06-13 DIAGNOSIS — D682 Hereditary deficiency of other clotting factors: Secondary | ICD-10-CM

## 2013-06-13 DIAGNOSIS — I2699 Other pulmonary embolism without acute cor pulmonale: Secondary | ICD-10-CM

## 2013-06-13 LAB — POCT INR: INR: 3.1

## 2013-07-10 ENCOUNTER — Ambulatory Visit (INDEPENDENT_AMBULATORY_CARE_PROVIDER_SITE_OTHER): Payer: BC Managed Care – PPO | Admitting: *Deleted

## 2013-07-10 DIAGNOSIS — I2699 Other pulmonary embolism without acute cor pulmonale: Secondary | ICD-10-CM

## 2013-07-10 DIAGNOSIS — D682 Hereditary deficiency of other clotting factors: Secondary | ICD-10-CM

## 2013-07-10 DIAGNOSIS — I82409 Acute embolism and thrombosis of unspecified deep veins of unspecified lower extremity: Secondary | ICD-10-CM

## 2013-07-10 DIAGNOSIS — Z7901 Long term (current) use of anticoagulants: Secondary | ICD-10-CM

## 2013-07-10 LAB — POCT INR: INR: 3.7

## 2013-08-07 ENCOUNTER — Ambulatory Visit (INDEPENDENT_AMBULATORY_CARE_PROVIDER_SITE_OTHER): Payer: BC Managed Care – PPO | Admitting: *Deleted

## 2013-08-07 DIAGNOSIS — Z7901 Long term (current) use of anticoagulants: Secondary | ICD-10-CM

## 2013-08-07 DIAGNOSIS — D682 Hereditary deficiency of other clotting factors: Secondary | ICD-10-CM

## 2013-08-07 DIAGNOSIS — I2699 Other pulmonary embolism without acute cor pulmonale: Secondary | ICD-10-CM

## 2013-08-07 DIAGNOSIS — I82409 Acute embolism and thrombosis of unspecified deep veins of unspecified lower extremity: Secondary | ICD-10-CM

## 2013-08-07 LAB — POCT INR: INR: 3.3

## 2013-08-21 ENCOUNTER — Other Ambulatory Visit: Payer: Self-pay | Admitting: *Deleted

## 2013-08-21 MED ORDER — WARFARIN SODIUM 5 MG PO TABS: 5.0000 mg | ORAL_TABLET | ORAL | Status: DC

## 2013-09-04 ENCOUNTER — Ambulatory Visit (INDEPENDENT_AMBULATORY_CARE_PROVIDER_SITE_OTHER): Payer: BC Managed Care – PPO | Admitting: *Deleted

## 2013-09-04 DIAGNOSIS — I82409 Acute embolism and thrombosis of unspecified deep veins of unspecified lower extremity: Secondary | ICD-10-CM

## 2013-09-04 DIAGNOSIS — I2699 Other pulmonary embolism without acute cor pulmonale: Secondary | ICD-10-CM

## 2013-09-04 DIAGNOSIS — Z7901 Long term (current) use of anticoagulants: Secondary | ICD-10-CM

## 2013-09-04 DIAGNOSIS — D682 Hereditary deficiency of other clotting factors: Secondary | ICD-10-CM

## 2013-09-04 LAB — POCT INR: INR: 2

## 2013-09-18 ENCOUNTER — Ambulatory Visit (INDEPENDENT_AMBULATORY_CARE_PROVIDER_SITE_OTHER): Payer: BC Managed Care – PPO | Admitting: *Deleted

## 2013-09-18 DIAGNOSIS — I82409 Acute embolism and thrombosis of unspecified deep veins of unspecified lower extremity: Secondary | ICD-10-CM

## 2013-09-18 DIAGNOSIS — Z7901 Long term (current) use of anticoagulants: Secondary | ICD-10-CM

## 2013-09-18 DIAGNOSIS — I2699 Other pulmonary embolism without acute cor pulmonale: Secondary | ICD-10-CM

## 2013-09-18 DIAGNOSIS — D682 Hereditary deficiency of other clotting factors: Secondary | ICD-10-CM

## 2013-09-18 LAB — POCT INR: INR: 3.6

## 2013-09-19 ENCOUNTER — Other Ambulatory Visit: Payer: Self-pay | Admitting: Internal Medicine

## 2013-09-19 DIAGNOSIS — E785 Hyperlipidemia, unspecified: Secondary | ICD-10-CM

## 2013-09-19 NOTE — Telephone Encounter (Signed)
OK #30 but fasting lipids, hepatic panel, TSH, CK needed Orders entered

## 2013-10-09 ENCOUNTER — Ambulatory Visit (INDEPENDENT_AMBULATORY_CARE_PROVIDER_SITE_OTHER): Payer: BC Managed Care – PPO | Admitting: *Deleted

## 2013-10-09 DIAGNOSIS — Z7901 Long term (current) use of anticoagulants: Secondary | ICD-10-CM

## 2013-10-09 DIAGNOSIS — D682 Hereditary deficiency of other clotting factors: Secondary | ICD-10-CM

## 2013-10-09 DIAGNOSIS — I82409 Acute embolism and thrombosis of unspecified deep veins of unspecified lower extremity: Secondary | ICD-10-CM

## 2013-10-09 DIAGNOSIS — I2699 Other pulmonary embolism without acute cor pulmonale: Secondary | ICD-10-CM

## 2013-10-09 LAB — POCT INR: INR: 3.2

## 2013-11-20 ENCOUNTER — Ambulatory Visit (INDEPENDENT_AMBULATORY_CARE_PROVIDER_SITE_OTHER): Payer: BC Managed Care – PPO | Admitting: Pharmacist Clinician (PhC)/ Clinical Pharmacy Specialist

## 2013-11-20 DIAGNOSIS — I2699 Other pulmonary embolism without acute cor pulmonale: Secondary | ICD-10-CM

## 2013-11-20 DIAGNOSIS — I82409 Acute embolism and thrombosis of unspecified deep veins of unspecified lower extremity: Secondary | ICD-10-CM

## 2013-11-20 DIAGNOSIS — Z7901 Long term (current) use of anticoagulants: Secondary | ICD-10-CM

## 2013-11-20 DIAGNOSIS — D682 Hereditary deficiency of other clotting factors: Secondary | ICD-10-CM

## 2013-11-20 LAB — POCT INR: INR: 3.7

## 2013-11-21 ENCOUNTER — Telehealth: Payer: Self-pay | Admitting: Internal Medicine

## 2013-11-21 NOTE — Telephone Encounter (Signed)
Pt request to transfer from [place] to [place] to get a physical. Please advise.

## 2013-11-21 NOTE — Telephone Encounter (Signed)
Ok with me 

## 2013-11-21 NOTE — Telephone Encounter (Signed)
OK ; he is an outstanding individual

## 2013-11-23 ENCOUNTER — Encounter: Payer: Self-pay | Admitting: Internal Medicine

## 2013-11-23 ENCOUNTER — Ambulatory Visit (INDEPENDENT_AMBULATORY_CARE_PROVIDER_SITE_OTHER): Payer: BC Managed Care – PPO | Admitting: Internal Medicine

## 2013-11-23 VITALS — BP 126/72 | HR 70 | Temp 98.4°F | Wt 181.5 lb

## 2013-11-23 DIAGNOSIS — B029 Zoster without complications: Secondary | ICD-10-CM

## 2013-11-23 MED ORDER — FAMCICLOVIR 500 MG PO TABS: 500.0000 mg | ORAL_TABLET | Freq: Three times a day (TID) | ORAL | Status: DC

## 2013-11-23 MED ORDER — GABAPENTIN 100 MG PO CAPS: ORAL_CAPSULE | ORAL | Status: DC

## 2013-11-23 NOTE — Progress Notes (Signed)
   Subjective:    Patient ID: Evan Cabrera, male    DOB: 09-14-1960, 53 y.o.   MRN: [id number]  HPI  His skin lesions began as papules which were tender and itchy over the left lower quadrant. The rash has progressed. This did not respond to over-the-counter cortisone 10  He has subsequently developed tenderness below the rash.  His wife who is a nurse questioned zoster.  He describes excess work stress over past several months.      Review of Systems   He has no fever, chills, sweats, weight loss  He has no associated genitourinary symptoms of dysuria, pyuria, or hematuria.  He has no change in bowel such as constipation or diarrhea.         Objective:   Physical Exam  There is a cluster of macular papular lesions in the left lower quadrant. There is a single lesion in the back. The distribution appears to be T-11.  In the left suprainguinal area there is tenderness with suggestion of a small direct hernia. I cannot appreciate definite inguinal lymphadenopathy.  General appearance :adequately nourished; in no distress. Eyes: No conjunctival inflammation or scleral icterus is present. Oral exam: Dental hygiene is good. Lips and gums are healthy appearing.There is no oropharyngeal erythema or exudate noted.  Heart:  Normal rate and regular rhythm. S1 and S2 normal without gallop, murmur, click, rub or other extra sounds   Lungs:Chest clear to auscultation; no wheezes, rhonchi,rales ,or rubs present.No increased work of breathing.  Abdomen: bowel sounds normal, soft and non-tender without masses, organomegaly or hernias noted.  No guarding or rebound.  Vascular : all pulses equal ; no bruits present. Skin:Warm & dry ; no jaundice or tenting Lymphatic: No lymphadenopathy is noted about the head, neck, axilla, or inguinal areas.           Assessment & Plan:  #1 T11 zoster  Plan: See orders recommendations

## 2013-11-23 NOTE — Patient Instructions (Signed)
Assess response to the gabapentin one every 8 hours as needed. If it is partially beneficial, it can be increased up to a total of 3 pills every 8 hours as needed. This increase of 1 pill each dose  should take place over 72 hours at least. 

## 2013-11-23 NOTE — Progress Notes (Signed)
Pre visit review using our clinic review tool, if applicable. No additional management support is needed unless otherwise documented below in the visit note. 

## 2013-12-19 ENCOUNTER — Encounter: Payer: BC Managed Care – PPO | Admitting: Internal Medicine

## 2013-12-27 ENCOUNTER — Encounter: Payer: BC Managed Care – PPO | Admitting: Internal Medicine

## 2013-12-31 ENCOUNTER — Ambulatory Visit (INDEPENDENT_AMBULATORY_CARE_PROVIDER_SITE_OTHER): Payer: BC Managed Care – PPO | Admitting: Internal Medicine

## 2013-12-31 ENCOUNTER — Encounter: Payer: Self-pay | Admitting: Internal Medicine

## 2013-12-31 ENCOUNTER — Other Ambulatory Visit (INDEPENDENT_AMBULATORY_CARE_PROVIDER_SITE_OTHER): Payer: BC Managed Care – PPO

## 2013-12-31 VITALS — BP 118/70 | HR 63 | Temp 97.7°F | Ht 68.0 in | Wt 183.0 lb

## 2013-12-31 DIAGNOSIS — Z0000 Encounter for general adult medical examination without abnormal findings: Secondary | ICD-10-CM

## 2013-12-31 DIAGNOSIS — D688 Other specified coagulation defects: Secondary | ICD-10-CM

## 2013-12-31 DIAGNOSIS — D6851 Activated protein C resistance: Secondary | ICD-10-CM

## 2013-12-31 DIAGNOSIS — Z7901 Long term (current) use of anticoagulants: Secondary | ICD-10-CM

## 2013-12-31 DIAGNOSIS — I2699 Other pulmonary embolism without acute cor pulmonale: Secondary | ICD-10-CM

## 2013-12-31 LAB — CBC WITH DIFFERENTIAL/PLATELET
BASOS PCT: 0.2 % (ref 0.0–3.0)
Basophils Absolute: 0 10*3/uL (ref 0.0–0.1)
Eosinophils Absolute: 0.2 10*3/uL (ref 0.0–0.7)
Eosinophils Relative: 2.2 % (ref 0.0–5.0)
HEMATOCRIT: 46.2 % (ref 39.0–52.0)
HEMOGLOBIN: 15.6 g/dL (ref 13.0–17.0)
Lymphocytes Relative: 33.6 % (ref 12.0–46.0)
Lymphs Abs: 2.4 10*3/uL (ref 0.7–4.0)
MCHC: 33.8 g/dL (ref 30.0–36.0)
MCV: 91.4 fl (ref 78.0–100.0)
MONO ABS: 0.5 10*3/uL (ref 0.1–1.0)
Monocytes Relative: 6.9 % (ref 3.0–12.0)
NEUTROS ABS: 4.1 10*3/uL (ref 1.4–7.7)
Neutrophils Relative %: 57.1 % (ref 43.0–77.0)
Platelets: 218 10*3/uL (ref 150.0–400.0)
RBC: 5.05 Mil/uL (ref 4.22–5.81)
RDW: 13.1 % (ref 11.5–15.5)
WBC: 7.2 10*3/uL (ref 4.0–10.5)

## 2013-12-31 LAB — LIPID PANEL
CHOLESTEROL: 211 mg/dL — AB (ref 0–200)
HDL: 39.7 mg/dL (ref >39.00)
NonHDL: 171.3
Total CHOL/HDL Ratio: 5
Triglycerides: 279 mg/dL — ABNORMAL HIGH (ref 0.0–149.0)
VLDL: 55.8 mg/dL — AB (ref 0.0–40.0)

## 2013-12-31 LAB — COMPREHENSIVE METABOLIC PANEL
ALT: 24 U/L (ref 0–53)
AST: 19 U/L (ref 0–37)
Albumin: 4.4 g/dL (ref 3.5–5.2)
Alkaline Phosphatase: 45 U/L (ref 39–117)
BUN: 19 mg/dL (ref 6–23)
CALCIUM: 9.3 mg/dL (ref 8.4–10.5)
CHLORIDE: 106 meq/L (ref 96–112)
CO2: 29 meq/L (ref 19–32)
CREATININE: 1.1 mg/dL (ref 0.4–1.5)
GFR: 76.12 mL/min (ref >60.00)
Glucose, Bld: 106 mg/dL — ABNORMAL HIGH (ref 70–99)
Potassium: 4.8 mEq/L (ref 3.5–5.1)
Sodium: 142 mEq/L (ref 135–145)
Total Bilirubin: 0.5 mg/dL (ref 0.2–1.2)
Total Protein: 7.1 g/dL (ref 6.0–8.3)

## 2013-12-31 LAB — PSA: PSA: 1.13 ng/mL (ref 0.10–4.00)

## 2013-12-31 LAB — LDL CHOLESTEROL, DIRECT: Direct LDL: 129.4 mg/dL

## 2013-12-31 LAB — TSH: TSH: 0.92 u[IU]/mL (ref 0.35–4.50)

## 2013-12-31 LAB — FECAL OCCULT BLOOD, GUAIAC: Fecal Occult Blood: NEGATIVE

## 2013-12-31 LAB — PROTIME-INR
INR: 3.2 ratio — AB (ref 0.8–1.0)
Prothrombin Time: 34.9 s — ABNORMAL HIGH (ref 9.6–13.1)

## 2013-12-31 NOTE — Patient Instructions (Signed)

## 2013-12-31 NOTE — Progress Notes (Signed)
Subjective:    Patient ID: Evan Cabrera, male    DOB: 1960/06/07, 53 y.o.   MRN: [id number]  HPI  New to me for a physical - he feels well and offers no complaints.   Review of Systems  Constitutional: Negative.  Negative for fever, chills, diaphoresis, appetite change and fatigue.  HENT: Negative.   Eyes: Negative.   Respiratory: Negative.  Negative for cough, choking, chest tightness, shortness of breath and stridor.   Cardiovascular: Negative.  Negative for chest pain, palpitations and leg swelling.  Gastrointestinal: Negative.  Negative for nausea, vomiting, abdominal pain, diarrhea, constipation and blood in stool.  Endocrine: Negative.   Genitourinary: Negative.  Negative for urgency, frequency, hematuria, difficulty urinating and testicular pain.  Musculoskeletal: Negative.   Neurological: Negative.   Hematological: Negative.  Negative for adenopathy. Does not bruise/bleed easily.  Psychiatric/Behavioral: Negative.        Objective:   Physical Exam  Constitutional: He is oriented to person, place, and time. He appears well-developed and well-nourished. No distress.  HENT:  Head: Normocephalic and atraumatic.  Mouth/Throat: Oropharynx is clear and moist. No oropharyngeal exudate.  Eyes: Conjunctivae are normal. Right eye exhibits no discharge. Left eye exhibits no discharge. No scleral icterus.  Neck: Normal range of motion. Neck supple. No JVD present. No tracheal deviation present. No thyromegaly present.  Cardiovascular: Normal rate, regular rhythm, normal heart sounds and intact distal pulses.  Exam reveals no gallop and no friction rub.   No murmur heard. Pulmonary/Chest: Effort normal and breath sounds normal. No stridor. No respiratory distress. He has no wheezes. He has no rales. He exhibits no tenderness.  Abdominal: Soft. Bowel sounds are normal. He exhibits no distension and no mass. There is no tenderness. There is no rebound and no guarding. Hernia confirmed  negative in the right inguinal area and confirmed negative in the left inguinal area.  Genitourinary: Rectum normal, prostate normal, testes normal and penis normal. Rectal exam shows no external hemorrhoid, no internal hemorrhoid, no fissure, no mass, no tenderness and anal tone normal. Guaiac negative stool. Prostate is not enlarged and not tender. Right testis shows no mass, no swelling and no tenderness. Right testis is descended. Left testis shows no mass, no swelling and no tenderness. Left testis is descended. Circumcised. No penile erythema or penile tenderness. No discharge found.  Musculoskeletal: Normal range of motion. He exhibits no edema or tenderness.  Lymphadenopathy:    He has no cervical adenopathy.       Right: No inguinal adenopathy present.       Left: No inguinal adenopathy present.  Neurological: He is oriented to person, place, and time.  Skin: Skin is warm and dry. No rash noted. He is not diaphoretic. No erythema. No pallor.  Psychiatric: He has a normal mood and affect. His behavior is normal. Judgment and thought content normal.  Vitals reviewed.   Lab Results  Component Value Date   WBC 7.2 12/31/2013   HGB 15.6 12/31/2013   HCT 46.2 12/31/2013   PLT 218.0 12/31/2013   GLUCOSE 106* 12/31/2013   CHOL 211* 12/31/2013   TRIG 279.0* 12/31/2013   HDL 39.70 12/31/2013   LDLDIRECT 129.4 12/31/2013   LDLCALC 87 09/18/2012   ALT 24 12/31/2013   AST 19 12/31/2013   NA 142 12/31/2013   K 4.8 12/31/2013   CL 106 12/31/2013   CREATININE 1.1 12/31/2013   BUN 19 12/31/2013   CO2 29 12/31/2013   TSH 0.92 12/31/2013   PSA  1.13 12/31/2013   INR 3.2* 12/31/2013   HGBA1C 5.7 09/23/2008        Assessment & Plan:

## 2013-12-31 NOTE — Assessment & Plan Note (Signed)
He refused a flu vaccine Exam done Labs ordered and reviewed Pt ed material was given

## 2014-02-01 LAB — HM COLONOSCOPY

## 2014-02-12 ENCOUNTER — Ambulatory Visit (INDEPENDENT_AMBULATORY_CARE_PROVIDER_SITE_OTHER): Payer: BLUE CROSS/BLUE SHIELD | Admitting: *Deleted

## 2014-02-12 DIAGNOSIS — D688 Other specified coagulation defects: Secondary | ICD-10-CM

## 2014-02-12 DIAGNOSIS — I82409 Acute embolism and thrombosis of unspecified deep veins of unspecified lower extremity: Secondary | ICD-10-CM

## 2014-02-12 DIAGNOSIS — D682 Hereditary deficiency of other clotting factors: Secondary | ICD-10-CM

## 2014-02-12 DIAGNOSIS — Z7901 Long term (current) use of anticoagulants: Secondary | ICD-10-CM

## 2014-02-12 DIAGNOSIS — I2699 Other pulmonary embolism without acute cor pulmonale: Secondary | ICD-10-CM

## 2014-02-12 DIAGNOSIS — D6851 Activated protein C resistance: Secondary | ICD-10-CM

## 2014-02-12 LAB — POCT INR: INR: 3.5

## 2014-02-21 ENCOUNTER — Other Ambulatory Visit: Payer: Self-pay | Admitting: Cardiology

## 2014-03-26 ENCOUNTER — Ambulatory Visit (INDEPENDENT_AMBULATORY_CARE_PROVIDER_SITE_OTHER): Payer: BLUE CROSS/BLUE SHIELD | Admitting: *Deleted

## 2014-03-26 DIAGNOSIS — D682 Hereditary deficiency of other clotting factors: Secondary | ICD-10-CM | POA: Diagnosis not present

## 2014-03-26 DIAGNOSIS — Z7901 Long term (current) use of anticoagulants: Secondary | ICD-10-CM | POA: Diagnosis not present

## 2014-03-26 DIAGNOSIS — D688 Other specified coagulation defects: Secondary | ICD-10-CM

## 2014-03-26 DIAGNOSIS — I2699 Other pulmonary embolism without acute cor pulmonale: Secondary | ICD-10-CM | POA: Diagnosis not present

## 2014-03-26 DIAGNOSIS — I82409 Acute embolism and thrombosis of unspecified deep veins of unspecified lower extremity: Secondary | ICD-10-CM

## 2014-03-26 DIAGNOSIS — D6851 Activated protein C resistance: Secondary | ICD-10-CM

## 2014-03-26 LAB — POCT INR: INR: 4.1

## 2014-04-01 ENCOUNTER — Ambulatory Visit (INDEPENDENT_AMBULATORY_CARE_PROVIDER_SITE_OTHER): Payer: BLUE CROSS/BLUE SHIELD | Admitting: Internal Medicine

## 2014-04-01 ENCOUNTER — Ambulatory Visit (INDEPENDENT_AMBULATORY_CARE_PROVIDER_SITE_OTHER)
Admission: RE | Admit: 2014-04-01 | Discharge: 2014-04-01 | Disposition: A | Discharge status: Home / Self Care | Payer: BLUE CROSS/BLUE SHIELD | Source: Ambulatory Visit | Attending: Internal Medicine | Admitting: Internal Medicine

## 2014-04-01 ENCOUNTER — Encounter: Payer: Self-pay | Admitting: Internal Medicine

## 2014-04-01 VITALS — BP 130/70 | HR 67 | Temp 97.9°F | Ht 68.0 in | Wt 189.0 lb

## 2014-04-01 DIAGNOSIS — Z7901 Long term (current) use of anticoagulants: Secondary | ICD-10-CM

## 2014-04-01 DIAGNOSIS — M25422 Effusion, left elbow: Secondary | ICD-10-CM

## 2014-04-01 DIAGNOSIS — S59902A Unspecified injury of left elbow, initial encounter: Secondary | ICD-10-CM | POA: Diagnosis not present

## 2014-04-01 NOTE — Progress Notes (Signed)
   Subjective:    Patient ID: Evan Cabrera, male    DOB: 1960-04-16, 54 y.o.   MRN: [id number]  HPI Approximately 2.5 weeks ago he experienced trauma to the left elbow. He hit the elbow on the door frame as he was exiting the bathroom. The pain was severe enough that he felt as if he might pass out. There was subsequent ecchymosis in this area.   Subsequently it has decreased by three fourths in size. The ecchymosis has resolved. He questions whether he may be some mobile structure(s) within the fluid.  His PT/INR was 4.1 on 3/22; warfarin was decreased to one half pill on Saturday.    Review of Systems   He denies fever, chills, or sweats.  Initially he did have some tingling in the area of the injury; this has resolved.       Objective:   Physical Exam  Pertinent or positive findings include: There is a 4 x 4 0.5 cm fluctuant collection of fluid at the left elbow.  There is suggestion of 2 asymmetric loose bodies proximally within the fluid. This area is tender to palpation.   General appearance :adequately nourished; in no distress. Eyes: No conjunctival inflammation or scleral icterus is present. Heart:  Normal rate and regular rhythm. S1 and S2 normal without gallop, murmur, click, rub or other extra sounds   Lungs:Chest clear to auscultation; no wheezes, rhonchi,rales ,or rubs present.No increased work of breathing.  Vascular : radial pulses equal & intact. Skin:Warm & dry.  Intact without suspicious lesions or rashes ; no tenting or jaundice  Lymphatic: No lymphadenopathy is noted about the head, neck, axilla, or epitrochlear areas Neuro: Strength, tone & DTRs in UE normal.        Assessment & Plan:  #1 elbow trauma with resultant mobile structures, probable bony fragments. Ecchymosis and effusion; improved despite excessively high PT/INR on warfarin.  Plan: See orders and recommendations

## 2014-04-01 NOTE — Patient Instructions (Signed)
Avoid trauma to the elbow; consider wearing an elbow pad as athletes wear if there is risk of repetitive injury  to this area.   Your next office appointment will be determined based upon review of your pending  xrays  Those instructions will be transmitted to you by My Chart  Followup as needed for any active or acute issue. Please report any significant change in your symptoms.

## 2014-04-01 NOTE — Progress Notes (Signed)
Pre visit review using our clinic review tool, if applicable. No additional management support is needed unless otherwise documented below in the visit note. 

## 2014-04-09 ENCOUNTER — Ambulatory Visit (INDEPENDENT_AMBULATORY_CARE_PROVIDER_SITE_OTHER): Payer: BLUE CROSS/BLUE SHIELD | Admitting: Pharmacist Clinician (PhC)/ Clinical Pharmacy Specialist

## 2014-04-09 DIAGNOSIS — I82409 Acute embolism and thrombosis of unspecified deep veins of unspecified lower extremity: Secondary | ICD-10-CM | POA: Diagnosis not present

## 2014-04-09 DIAGNOSIS — D688 Other specified coagulation defects: Secondary | ICD-10-CM

## 2014-04-09 DIAGNOSIS — D682 Hereditary deficiency of other clotting factors: Secondary | ICD-10-CM | POA: Diagnosis not present

## 2014-04-09 DIAGNOSIS — I2699 Other pulmonary embolism without acute cor pulmonale: Secondary | ICD-10-CM | POA: Diagnosis not present

## 2014-04-09 DIAGNOSIS — D6851 Activated protein C resistance: Secondary | ICD-10-CM

## 2014-04-09 DIAGNOSIS — Z7901 Long term (current) use of anticoagulants: Secondary | ICD-10-CM

## 2014-04-09 LAB — POCT INR: INR: 2.9

## 2014-04-22 ENCOUNTER — Other Ambulatory Visit: Payer: Self-pay | Admitting: Internal Medicine

## 2014-04-24 ENCOUNTER — Ambulatory Visit (INDEPENDENT_AMBULATORY_CARE_PROVIDER_SITE_OTHER): Payer: BLUE CROSS/BLUE SHIELD | Admitting: *Deleted

## 2014-04-24 DIAGNOSIS — I82409 Acute embolism and thrombosis of unspecified deep veins of unspecified lower extremity: Secondary | ICD-10-CM | POA: Diagnosis not present

## 2014-04-24 DIAGNOSIS — I2699 Other pulmonary embolism without acute cor pulmonale: Secondary | ICD-10-CM

## 2014-04-24 DIAGNOSIS — D682 Hereditary deficiency of other clotting factors: Secondary | ICD-10-CM

## 2014-04-24 DIAGNOSIS — Z7901 Long term (current) use of anticoagulants: Secondary | ICD-10-CM | POA: Diagnosis not present

## 2014-04-24 DIAGNOSIS — D688 Other specified coagulation defects: Secondary | ICD-10-CM

## 2014-04-24 DIAGNOSIS — D6851 Activated protein C resistance: Secondary | ICD-10-CM

## 2014-04-24 LAB — POCT INR: INR: 3.5

## 2014-05-07 ENCOUNTER — Ambulatory Visit (INDEPENDENT_AMBULATORY_CARE_PROVIDER_SITE_OTHER): Payer: BLUE CROSS/BLUE SHIELD | Admitting: *Deleted

## 2014-05-07 DIAGNOSIS — I2699 Other pulmonary embolism without acute cor pulmonale: Secondary | ICD-10-CM

## 2014-05-07 DIAGNOSIS — D682 Hereditary deficiency of other clotting factors: Secondary | ICD-10-CM | POA: Diagnosis not present

## 2014-05-07 DIAGNOSIS — Z7901 Long term (current) use of anticoagulants: Secondary | ICD-10-CM

## 2014-05-07 DIAGNOSIS — I82409 Acute embolism and thrombosis of unspecified deep veins of unspecified lower extremity: Secondary | ICD-10-CM | POA: Diagnosis not present

## 2014-05-07 DIAGNOSIS — D6851 Activated protein C resistance: Secondary | ICD-10-CM

## 2014-05-07 DIAGNOSIS — D688 Other specified coagulation defects: Secondary | ICD-10-CM

## 2014-05-07 LAB — POCT INR: INR: 3

## 2014-06-11 ENCOUNTER — Ambulatory Visit (INDEPENDENT_AMBULATORY_CARE_PROVIDER_SITE_OTHER): Payer: BLUE CROSS/BLUE SHIELD | Admitting: *Deleted

## 2014-06-11 DIAGNOSIS — Z7901 Long term (current) use of anticoagulants: Secondary | ICD-10-CM | POA: Diagnosis not present

## 2014-06-11 DIAGNOSIS — I82409 Acute embolism and thrombosis of unspecified deep veins of unspecified lower extremity: Secondary | ICD-10-CM

## 2014-06-11 DIAGNOSIS — I2699 Other pulmonary embolism without acute cor pulmonale: Secondary | ICD-10-CM | POA: Diagnosis not present

## 2014-06-11 DIAGNOSIS — D688 Other specified coagulation defects: Secondary | ICD-10-CM

## 2014-06-11 DIAGNOSIS — D6851 Activated protein C resistance: Secondary | ICD-10-CM

## 2014-06-11 DIAGNOSIS — D682 Hereditary deficiency of other clotting factors: Secondary | ICD-10-CM

## 2014-06-11 LAB — POCT INR: INR: 3.3

## 2014-07-23 ENCOUNTER — Ambulatory Visit (INDEPENDENT_AMBULATORY_CARE_PROVIDER_SITE_OTHER): Payer: BLUE CROSS/BLUE SHIELD | Admitting: *Deleted

## 2014-07-23 DIAGNOSIS — D682 Hereditary deficiency of other clotting factors: Secondary | ICD-10-CM | POA: Diagnosis not present

## 2014-07-23 DIAGNOSIS — I82409 Acute embolism and thrombosis of unspecified deep veins of unspecified lower extremity: Secondary | ICD-10-CM

## 2014-07-23 DIAGNOSIS — D6851 Activated protein C resistance: Secondary | ICD-10-CM

## 2014-07-23 DIAGNOSIS — Z7901 Long term (current) use of anticoagulants: Secondary | ICD-10-CM | POA: Diagnosis not present

## 2014-07-23 DIAGNOSIS — D688 Other specified coagulation defects: Secondary | ICD-10-CM

## 2014-07-23 DIAGNOSIS — I2699 Other pulmonary embolism without acute cor pulmonale: Secondary | ICD-10-CM | POA: Diagnosis not present

## 2014-07-23 LAB — POCT INR: INR: 2.6

## 2014-09-02 ENCOUNTER — Other Ambulatory Visit: Payer: Self-pay | Admitting: Cardiology

## 2014-09-03 ENCOUNTER — Ambulatory Visit (INDEPENDENT_AMBULATORY_CARE_PROVIDER_SITE_OTHER): Payer: BLUE CROSS/BLUE SHIELD | Admitting: *Deleted

## 2014-09-03 DIAGNOSIS — Z7901 Long term (current) use of anticoagulants: Secondary | ICD-10-CM

## 2014-09-03 DIAGNOSIS — D682 Hereditary deficiency of other clotting factors: Secondary | ICD-10-CM

## 2014-09-03 DIAGNOSIS — I82409 Acute embolism and thrombosis of unspecified deep veins of unspecified lower extremity: Secondary | ICD-10-CM

## 2014-09-03 DIAGNOSIS — D688 Other specified coagulation defects: Secondary | ICD-10-CM | POA: Diagnosis not present

## 2014-09-03 DIAGNOSIS — D6851 Activated protein C resistance: Secondary | ICD-10-CM

## 2014-09-03 DIAGNOSIS — I2699 Other pulmonary embolism without acute cor pulmonale: Secondary | ICD-10-CM | POA: Diagnosis not present

## 2014-09-03 LAB — POCT INR: INR: 3

## 2014-10-15 ENCOUNTER — Ambulatory Visit (INDEPENDENT_AMBULATORY_CARE_PROVIDER_SITE_OTHER): Payer: BLUE CROSS/BLUE SHIELD | Admitting: *Deleted

## 2014-10-15 DIAGNOSIS — D6851 Activated protein C resistance: Secondary | ICD-10-CM | POA: Diagnosis not present

## 2014-10-15 DIAGNOSIS — I2699 Other pulmonary embolism without acute cor pulmonale: Secondary | ICD-10-CM

## 2014-10-15 DIAGNOSIS — D682 Hereditary deficiency of other clotting factors: Secondary | ICD-10-CM | POA: Diagnosis not present

## 2014-10-15 DIAGNOSIS — Z7901 Long term (current) use of anticoagulants: Secondary | ICD-10-CM

## 2014-10-15 DIAGNOSIS — I82409 Acute embolism and thrombosis of unspecified deep veins of unspecified lower extremity: Secondary | ICD-10-CM | POA: Diagnosis not present

## 2014-10-15 LAB — POCT INR: INR: 2.6

## 2014-10-22 ENCOUNTER — Other Ambulatory Visit: Payer: Self-pay | Admitting: Internal Medicine

## 2014-11-20 ENCOUNTER — Encounter: Payer: Self-pay | Admitting: Internal Medicine

## 2014-11-20 ENCOUNTER — Other Ambulatory Visit (INDEPENDENT_AMBULATORY_CARE_PROVIDER_SITE_OTHER): Payer: BLUE CROSS/BLUE SHIELD

## 2014-11-20 ENCOUNTER — Ambulatory Visit (INDEPENDENT_AMBULATORY_CARE_PROVIDER_SITE_OTHER): Payer: BLUE CROSS/BLUE SHIELD | Admitting: Internal Medicine

## 2014-11-20 VITALS — BP 120/78 | HR 56 | Temp 97.8°F | Resp 16 | Ht 68.0 in | Wt 184.0 lb

## 2014-11-20 DIAGNOSIS — R7303 Prediabetes: Secondary | ICD-10-CM | POA: Diagnosis not present

## 2014-11-20 DIAGNOSIS — R7989 Other specified abnormal findings of blood chemistry: Secondary | ICD-10-CM | POA: Diagnosis not present

## 2014-11-20 DIAGNOSIS — D6851 Activated protein C resistance: Secondary | ICD-10-CM | POA: Diagnosis not present

## 2014-11-20 DIAGNOSIS — Z7901 Long term (current) use of anticoagulants: Secondary | ICD-10-CM

## 2014-11-20 DIAGNOSIS — Z0000 Encounter for general adult medical examination without abnormal findings: Secondary | ICD-10-CM

## 2014-11-20 LAB — CBC WITH DIFFERENTIAL/PLATELET
BASOS PCT: 0.3 % (ref 0.0–3.0)
Basophils Absolute: 0 10*3/uL (ref 0.0–0.1)
EOS PCT: 1.9 % (ref 0.0–5.0)
Eosinophils Absolute: 0.1 10*3/uL (ref 0.0–0.7)
HEMATOCRIT: 46.4 % (ref 39.0–52.0)
HEMOGLOBIN: 15.5 g/dL (ref 13.0–17.0)
LYMPHS PCT: 35 % (ref 12.0–46.0)
Lymphs Abs: 2.4 10*3/uL (ref 0.7–4.0)
MCHC: 33.4 g/dL (ref 30.0–36.0)
MCV: 91.1 fl (ref 78.0–100.0)
MONOS PCT: 6.4 % (ref 3.0–12.0)
Monocytes Absolute: 0.4 10*3/uL (ref 0.1–1.0)
NEUTROS ABS: 3.8 10*3/uL (ref 1.4–7.7)
Neutrophils Relative %: 56.4 % (ref 43.0–77.0)
PLATELETS: 192 10*3/uL (ref 150.0–400.0)
RBC: 5.09 Mil/uL (ref 4.22–5.81)
RDW: 13.1 % (ref 11.5–15.5)
WBC: 6.7 10*3/uL (ref 4.0–10.5)

## 2014-11-20 LAB — PROTIME-INR
INR: 2.8 ratio — AB (ref 0.8–1.0)
Prothrombin Time: 29.9 s — ABNORMAL HIGH (ref 9.6–13.1)

## 2014-11-20 LAB — LIPID PANEL
CHOL/HDL RATIO: 4
CHOLESTEROL: 187 mg/dL (ref 0–200)
HDL: 42 mg/dL (ref >39.00)
NonHDL: 144.72
Triglycerides: 219 mg/dL — ABNORMAL HIGH (ref 0.0–149.0)
VLDL: 43.8 mg/dL — ABNORMAL HIGH (ref 0.0–40.0)

## 2014-11-20 LAB — COMPREHENSIVE METABOLIC PANEL
ALT: 24 U/L (ref 0–53)
AST: 18 U/L (ref 0–37)
Albumin: 4.4 g/dL (ref 3.5–5.2)
Alkaline Phosphatase: 49 U/L (ref 39–117)
BUN: 13 mg/dL (ref 6–23)
CALCIUM: 9.3 mg/dL (ref 8.4–10.5)
CO2: 30 meq/L (ref 19–32)
CREATININE: 1.02 mg/dL (ref 0.40–1.50)
Chloride: 104 mEq/L (ref 96–112)
GFR: 80.18 mL/min (ref >60.00)
GLUCOSE: 90 mg/dL (ref 70–99)
Potassium: 4.1 mEq/L (ref 3.5–5.1)
Sodium: 140 mEq/L (ref 135–145)
Total Bilirubin: 0.6 mg/dL (ref 0.2–1.2)
Total Protein: 6.7 g/dL (ref 6.0–8.3)

## 2014-11-20 LAB — HEPATITIS C ANTIBODY: HCV AB: NEGATIVE

## 2014-11-20 LAB — FECAL OCCULT BLOOD, GUAIAC: Fecal Occult Blood: NEGATIVE

## 2014-11-20 LAB — PSA: PSA: 1.28 ng/mL (ref 0.10–4.00)

## 2014-11-20 LAB — TSH: TSH: 1.37 u[IU]/mL (ref 0.35–4.50)

## 2014-11-20 LAB — LDL CHOLESTEROL, DIRECT: Direct LDL: 115 mg/dL

## 2014-11-20 LAB — HEMOGLOBIN A1C: Hgb A1c MFr Bld: 6.3 % (ref 4.6–6.5)

## 2014-11-20 NOTE — Progress Notes (Signed)
Pre visit review using our clinic review tool, if applicable. No additional management support is needed unless otherwise documented below in the visit note. 

## 2014-11-20 NOTE — Patient Instructions (Signed)

## 2014-11-21 ENCOUNTER — Encounter: Payer: Self-pay | Admitting: Internal Medicine

## 2014-11-21 NOTE — Progress Notes (Signed)
Subjective:  Patient ID: Evan Cabrera, male    DOB: August 15, 1960  Age: 54 y.o. MRN: [id number]  CC: Annual Exam and Hyperlipidemia   HPI [name] presents for a CPX - he feels well and offers no complaints.  Outpatient Prescriptions Prior to Visit  Medication Sig Dispense Refill  . Cinnamon 500 MG TABS Take 500 mg by mouth daily.      . folic acid (FOLVITE) [id number] MCG tablet Take 800 mcg by mouth daily.      . Multiple Vitamin (MULTIVITAMIN) tablet Take 1 tablet by mouth daily.      . Omega-3 Fatty Acids (FISH OIL) 1200 MG CAPS Take 1,200 mg by mouth daily.      . vitamin B-12 (CYANOCOBALAMIN) 500 MCG tablet Take 500 mcg by mouth daily.      . vitamin C (ASCORBIC ACID) 500 MG tablet Take 500 mg by mouth daily.      Marland Kitchen VYTORIN 10-40 MG tablet TAKE 1 TABLET BY MOUTH DAILY AT BEDTIME 90 tablet 1  . warfarin (COUMADIN) 5 MG tablet TAKE 1 TABLET BY MOUTH ONCE DAILY AS DIRECTED 45 tablet 3   No facility-administered medications prior to visit.    ROS Review of Systems  Constitutional: Negative.  Negative for fever, chills, diaphoresis, appetite change and fatigue.  HENT: Negative.  Negative for sinus pressure, trouble swallowing and voice change.   Eyes: Negative.   Respiratory: Negative.  Negative for cough, choking, chest tightness, shortness of breath and stridor.   Cardiovascular: Negative.  Negative for chest pain, palpitations and leg swelling.  Gastrointestinal: Negative.  Negative for nausea, vomiting, abdominal pain, diarrhea, constipation and blood in stool.  Endocrine: Negative.   Genitourinary: Negative.  Negative for urgency, frequency, decreased urine volume, enuresis and difficulty urinating.  Musculoskeletal: Negative.  Negative for myalgias, arthralgias, gait problem and neck pain.  Skin: Negative.  Negative for color change and rash.  Allergic/Immunologic: Negative.   Neurological: Negative.  Negative for dizziness, tremors, syncope, light-headedness and headaches.    Hematological: Negative.  Negative for adenopathy. Does not bruise/bleed easily.  Psychiatric/Behavioral: Negative.     Objective:  BP 120/78 mmHg  Pulse 56  Temp(Src) 97.8 F (36.6 C) (Oral)  Resp 16  Ht 5\' 8"  (1.727 m)  Wt 184 lb (83.462 kg)  BMI 27.98 kg/m2  SpO2 95%  BP Readings from Last 3 Encounters:  11/20/14 120/78  04/01/14 130/70  12/31/13 118/70    Wt Readings from Last 3 Encounters:  11/20/14 184 lb (83.462 kg)  04/01/14 189 lb (85.73 kg)  12/31/13 183 lb (83.008 kg)    Physical Exam  Constitutional: He is oriented to person, place, and time. No distress.  HENT:  Head: Normocephalic and atraumatic.  Mouth/Throat: Oropharynx is clear and moist. No oropharyngeal exudate.  Eyes: Conjunctivae are normal. Right eye exhibits no discharge. Left eye exhibits no discharge. No scleral icterus.  Neck: Normal range of motion. Neck supple. No JVD present. No tracheal deviation present. No thyromegaly present.  Cardiovascular: Normal rate, regular rhythm, normal heart sounds and intact distal pulses.  Exam reveals no gallop and no friction rub.   No murmur heard. Pulmonary/Chest: Effort normal and breath sounds normal. No stridor. No respiratory distress. He has no wheezes. He has no rales. He exhibits no tenderness.  Abdominal: Soft. Bowel sounds are normal. He exhibits no distension and no mass. There is no tenderness. There is no rebound and no guarding. Hernia confirmed negative in the right inguinal area and confirmed  negative in the left inguinal area.  Genitourinary: Rectum normal, prostate normal, testes normal and penis normal. Rectal exam shows no external hemorrhoid, no internal hemorrhoid, no fissure, no mass, no tenderness and anal tone normal. Guaiac negative stool. Prostate is not enlarged and not tender. Right testis shows no mass, no swelling and no tenderness. Right testis is descended. Left testis shows no mass, no swelling and no tenderness. Left testis is  descended. Circumcised. No penile erythema or penile tenderness. No discharge found.  Musculoskeletal: Normal range of motion. He exhibits no edema or tenderness.  Lymphadenopathy:    He has no cervical adenopathy.       Right: No inguinal adenopathy present.       Left: No inguinal adenopathy present.  Neurological: He is oriented to person, place, and time.  Skin: Skin is warm and dry. No rash noted. He is not diaphoretic. No erythema. No pallor.  Psychiatric: He has a normal mood and affect. His behavior is normal. Judgment and thought content normal.  Vitals reviewed.   Lab Results  Component Value Date   WBC 6.7 11/20/2014   HGB 15.5 11/20/2014   HCT 46.4 11/20/2014   PLT 192.0 11/20/2014   GLUCOSE 90 11/20/2014   CHOL 187 11/20/2014   TRIG 219.0* 11/20/2014   HDL 42.00 11/20/2014   LDLDIRECT 115.0 11/20/2014   LDLCALC 87 09/18/2012   ALT 24 11/20/2014   AST 18 11/20/2014   NA 140 11/20/2014   K 4.1 11/20/2014   CL 104 11/20/2014   CREATININE 1.02 11/20/2014   BUN 13 11/20/2014   CO2 30 11/20/2014   TSH 1.37 11/20/2014   PSA 1.28 11/20/2014   INR 2.8* 11/20/2014   HGBA1C 6.3 11/20/2014    Dg Elbow Complete Left  04/01/2014  CLINICAL DATA:  Pain and swelling of the elbow after blunt trauma 2.5 weeks ago. EXAM: LEFT ELBOW - COMPLETE 3+ VIEW COMPARISON:  None. FINDINGS: There is no fracture or dislocation or joint effusion. There is soft tissue swelling over the olecranon process consistent with posttraumatic olecranon bursitis. In addition, there is calcific enthesopathy at the insertion of the triceps tendon on the posterior aspect of the olecranon process. IMPRESSION: 1. No acute osseous abnormality. 2. Probable posttraumatic olecranon bursitis. 3. 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 Electronically Signed   By: Lorriane Shire M.D.   On: 04/01/2014 15:28    Assessment & Plan:   Khyir was seen today for annual exam and  hyperlipidemia.  Diagnoses and all orders for this visit:  Anticoagulant long-term use- his INR is in the therapeutic range -     Protime-INR; Future  Factor 5 Leiden mutation, heterozygous ([place]) -     Protime-INR; Future  Routine general medical examination at a health care facility- he refused a flu vax today, exam done, labs reviewed, colonoscopy is UTD -     Lipid panel; Future -     Comprehensive metabolic panel; Future -     CBC with Differential/Platelet; Future -     TSH; Future -     PSA; Future -     Hepatitis C antibody; Future  Prediabetes- his A1C is c/w this, he is working on his lifestyle modifications -     Hemoglobin A1c; Future   I am having Mr. Olmsted maintain his vitamin B-12, Cinnamon, Fish Oil, folic acid, vitamin C, multivitamin, warfarin, and VYTORIN.  No orders of the defined types were placed in this encounter.  Follow-up: Return in about 6 months (around 05/20/2015).  Scarlette Calico, MD

## 2014-12-05 HISTORY — PX: COLONOSCOPY: SHX174

## 2014-12-26 ENCOUNTER — Telehealth: Payer: Self-pay | Admitting: *Deleted

## 2014-12-26 ENCOUNTER — Ambulatory Visit (INDEPENDENT_AMBULATORY_CARE_PROVIDER_SITE_OTHER): Payer: BLUE CROSS/BLUE SHIELD | Admitting: *Deleted

## 2014-12-26 DIAGNOSIS — I82409 Acute embolism and thrombosis of unspecified deep veins of unspecified lower extremity: Secondary | ICD-10-CM | POA: Diagnosis not present

## 2014-12-26 DIAGNOSIS — I2699 Other pulmonary embolism without acute cor pulmonale: Secondary | ICD-10-CM

## 2014-12-26 DIAGNOSIS — Z7901 Long term (current) use of anticoagulants: Secondary | ICD-10-CM

## 2014-12-26 DIAGNOSIS — D6851 Activated protein C resistance: Secondary | ICD-10-CM

## 2014-12-26 DIAGNOSIS — D682 Hereditary deficiency of other clotting factors: Secondary | ICD-10-CM | POA: Diagnosis not present

## 2014-12-26 LAB — POCT INR: INR: 3

## 2014-12-26 MED ORDER — ENOXAPARIN SODIUM 80 MG/0.8ML ~~LOC~~ SOLN
80.0000 mg | Freq: Two times a day (BID) | SUBCUTANEOUS | Status: DC
Start: 2014-12-26 — End: 2015-01-07

## 2014-12-26 NOTE — Telephone Encounter (Signed)
Received call Dr. Elana Alm office stating the pt is schedule for his colonoscopy on 12/29. We did received orders concerniong the Lovenox injections, but was not told if it was ok to hold coumadin 5 days prior to appt. Inform nurse per chart pt goes to coumadin clinic on [address]. He had an appt with this this am, and according to their instructions its ok to hold.... Attach their instructions below  12/26/14- Take 5mg s Coumadin today  12/27/14- Take 5mg s of Coumadin , this is your last dose.   12/28/14- Do Nothing   12/29/14- Start Lovenox 80mg s injections at 8am and 8pm- every 12 hours, 2 inches away from navel, rotate sites.  12/30/14- Continue Lovenox 80mg  injection at 8am and 8pm   12/31/14- Continue Lovenox 80mg s injections at 8am and 8pm- every 12 hours, 2 inches away from navel, rotate sites.  01/01/15- Take Last Lovenox 80mg  injection at 8am. Do not take 8pm dosage.   01/02/15- Day procedure, no Lovenox. When ok with GI doctor resume Coumadin, usually that night. When restart take an extra 1/2 tablet along with normal dosage for 2 days, then resume normal dosage.   01/03/15- If ok with GI doctor, Resume Lovenox 80mg s injections at 8am and 8pm. Continue Lovenox injections every 12 hrs and Coumadin as scheduled both until your follow up appt.        Referring Provider

## 2014-12-26 NOTE — Patient Instructions (Addendum)
12/26/14- Take 5mg s Coumadin today  12/27/14- Take 5mg s of Coumadin , this is your last dose.   12/28/14- Do Nothing   12/29/14- Start Lovenox 80mg s injections at 8am and 8pm- every 12 hours, 2 inches away from navel, rotate sites.   12/30/14- Continue Lovenox 80mg  injection at 8am and 8pm   12/31/14- Continue  Lovenox 80mg s injections at 8am and 8pm- every 12 hours, 2 inches away from navel, rotate sites.  01/01/15- Take Last Lovenox 80mg  injection at 8am. Do not take 8pm dosage.   01/02/15- Day procedure, no Lovenox. When ok with GI doctor resume Coumadin, usually that night. When restart take an extra 1/2 tablet along with normal dosage for 2 days, then resume normal dosage.   01/03/15- If ok with GI doctor, Resume Lovenox 80mg s injections at 8am and 8pm. Continue Lovenox injections every 12 hrs and Coumadin as scheduled both  until your follow up appt.

## 2015-01-07 ENCOUNTER — Ambulatory Visit (INDEPENDENT_AMBULATORY_CARE_PROVIDER_SITE_OTHER): Payer: Managed Care, Other (non HMO) | Admitting: *Deleted

## 2015-01-07 DIAGNOSIS — D682 Hereditary deficiency of other clotting factors: Secondary | ICD-10-CM

## 2015-01-07 DIAGNOSIS — I82409 Acute embolism and thrombosis of unspecified deep veins of unspecified lower extremity: Secondary | ICD-10-CM

## 2015-01-07 DIAGNOSIS — Z7901 Long term (current) use of anticoagulants: Secondary | ICD-10-CM

## 2015-01-07 DIAGNOSIS — D6851 Activated protein C resistance: Secondary | ICD-10-CM

## 2015-01-07 DIAGNOSIS — I2699 Other pulmonary embolism without acute cor pulmonale: Secondary | ICD-10-CM

## 2015-01-07 LAB — POCT INR: INR: 1.5

## 2015-01-07 MED ORDER — ENOXAPARIN SODIUM 80 MG/0.8ML ~~LOC~~ SOLN: 80.0000 mg | Freq: Two times a day (BID) | SUBCUTANEOUS | Status: DC

## 2015-01-13 ENCOUNTER — Ambulatory Visit (INDEPENDENT_AMBULATORY_CARE_PROVIDER_SITE_OTHER): Payer: Managed Care, Other (non HMO) | Admitting: *Deleted

## 2015-01-13 DIAGNOSIS — D682 Hereditary deficiency of other clotting factors: Secondary | ICD-10-CM

## 2015-01-13 DIAGNOSIS — D6851 Activated protein C resistance: Secondary | ICD-10-CM | POA: Diagnosis not present

## 2015-01-13 DIAGNOSIS — I82409 Acute embolism and thrombosis of unspecified deep veins of unspecified lower extremity: Secondary | ICD-10-CM | POA: Diagnosis not present

## 2015-01-13 DIAGNOSIS — I2699 Other pulmonary embolism without acute cor pulmonale: Secondary | ICD-10-CM | POA: Diagnosis not present

## 2015-01-13 DIAGNOSIS — Z7901 Long term (current) use of anticoagulants: Secondary | ICD-10-CM

## 2015-01-13 LAB — POCT INR: INR: 2.1

## 2015-01-23 ENCOUNTER — Ambulatory Visit (INDEPENDENT_AMBULATORY_CARE_PROVIDER_SITE_OTHER): Payer: Managed Care, Other (non HMO) | Admitting: Pharmacist

## 2015-01-23 DIAGNOSIS — D682 Hereditary deficiency of other clotting factors: Secondary | ICD-10-CM

## 2015-01-23 DIAGNOSIS — I2699 Other pulmonary embolism without acute cor pulmonale: Secondary | ICD-10-CM | POA: Diagnosis not present

## 2015-01-23 DIAGNOSIS — D6851 Activated protein C resistance: Secondary | ICD-10-CM | POA: Diagnosis not present

## 2015-01-23 DIAGNOSIS — Z7901 Long term (current) use of anticoagulants: Secondary | ICD-10-CM

## 2015-01-23 DIAGNOSIS — I82409 Acute embolism and thrombosis of unspecified deep veins of unspecified lower extremity: Secondary | ICD-10-CM | POA: Diagnosis not present

## 2015-01-23 LAB — POCT INR: INR: 1.9

## 2015-01-23 MED ORDER — ENOXAPARIN SODIUM 80 MG/0.8ML ~~LOC~~ SOLN: 80.0000 mg | Freq: Two times a day (BID) | SUBCUTANEOUS | Status: DC

## 2015-01-27 ENCOUNTER — Ambulatory Visit (INDEPENDENT_AMBULATORY_CARE_PROVIDER_SITE_OTHER): Payer: Managed Care, Other (non HMO) | Admitting: *Deleted

## 2015-01-27 DIAGNOSIS — I2699 Other pulmonary embolism without acute cor pulmonale: Secondary | ICD-10-CM | POA: Diagnosis not present

## 2015-01-27 DIAGNOSIS — D6851 Activated protein C resistance: Secondary | ICD-10-CM

## 2015-01-27 DIAGNOSIS — I82409 Acute embolism and thrombosis of unspecified deep veins of unspecified lower extremity: Secondary | ICD-10-CM

## 2015-01-27 DIAGNOSIS — Z7901 Long term (current) use of anticoagulants: Secondary | ICD-10-CM

## 2015-01-27 DIAGNOSIS — D682 Hereditary deficiency of other clotting factors: Secondary | ICD-10-CM

## 2015-01-27 LAB — POCT INR: INR: 2.8

## 2015-01-29 ENCOUNTER — Telehealth: Payer: Self-pay | Admitting: Internal Medicine

## 2015-01-29 NOTE — Telephone Encounter (Signed)
Patient is calling regarding vytorin. He is being denied the med due to PA needed. Insurance has changed to [place]. New insurance is on file already. Please follow up on PA. Patient states that hje is allergic to all other alternatives, which is why he needs vytorin specifically

## 2015-01-31 NOTE — Telephone Encounter (Signed)
Evan Cabrera (Key: [id number])   Approved. Pharmacy notified

## 2015-02-03 ENCOUNTER — Telehealth: Payer: Self-pay | Admitting: Internal Medicine

## 2015-02-03 NOTE — Telephone Encounter (Signed)
Patient is checking on the status on PA for vytorin.  States he has been out of medication for 3 weeks.  Please give call back in regards.

## 2015-02-03 NOTE — Telephone Encounter (Signed)
PA was approved 01/31/2015. Verified with pharmacy, pt infomed

## 2015-02-10 ENCOUNTER — Ambulatory Visit (INDEPENDENT_AMBULATORY_CARE_PROVIDER_SITE_OTHER): Payer: Managed Care, Other (non HMO)

## 2015-02-10 DIAGNOSIS — D6851 Activated protein C resistance: Secondary | ICD-10-CM | POA: Diagnosis not present

## 2015-02-10 DIAGNOSIS — D682 Hereditary deficiency of other clotting factors: Secondary | ICD-10-CM | POA: Diagnosis not present

## 2015-02-10 DIAGNOSIS — Z7901 Long term (current) use of anticoagulants: Secondary | ICD-10-CM | POA: Diagnosis not present

## 2015-02-10 DIAGNOSIS — I82409 Acute embolism and thrombosis of unspecified deep veins of unspecified lower extremity: Secondary | ICD-10-CM | POA: Diagnosis not present

## 2015-02-10 DIAGNOSIS — I2699 Other pulmonary embolism without acute cor pulmonale: Secondary | ICD-10-CM | POA: Diagnosis not present

## 2015-02-10 LAB — POCT INR: INR: 2.3

## 2015-03-04 ENCOUNTER — Ambulatory Visit (INDEPENDENT_AMBULATORY_CARE_PROVIDER_SITE_OTHER): Payer: Managed Care, Other (non HMO) | Admitting: *Deleted

## 2015-03-04 DIAGNOSIS — D6851 Activated protein C resistance: Secondary | ICD-10-CM

## 2015-03-04 DIAGNOSIS — I82409 Acute embolism and thrombosis of unspecified deep veins of unspecified lower extremity: Secondary | ICD-10-CM

## 2015-03-04 DIAGNOSIS — I2699 Other pulmonary embolism without acute cor pulmonale: Secondary | ICD-10-CM

## 2015-03-04 DIAGNOSIS — D682 Hereditary deficiency of other clotting factors: Secondary | ICD-10-CM

## 2015-03-04 DIAGNOSIS — Z7901 Long term (current) use of anticoagulants: Secondary | ICD-10-CM | POA: Diagnosis not present

## 2015-03-04 LAB — POCT INR: INR: 4.5

## 2015-03-07 ENCOUNTER — Other Ambulatory Visit: Payer: Self-pay | Admitting: Cardiology

## 2015-03-25 ENCOUNTER — Ambulatory Visit (INDEPENDENT_AMBULATORY_CARE_PROVIDER_SITE_OTHER): Payer: Managed Care, Other (non HMO) | Admitting: Pharmacist

## 2015-03-25 DIAGNOSIS — D682 Hereditary deficiency of other clotting factors: Secondary | ICD-10-CM

## 2015-03-25 DIAGNOSIS — I82409 Acute embolism and thrombosis of unspecified deep veins of unspecified lower extremity: Secondary | ICD-10-CM | POA: Diagnosis not present

## 2015-03-25 DIAGNOSIS — Z7901 Long term (current) use of anticoagulants: Secondary | ICD-10-CM | POA: Diagnosis not present

## 2015-03-25 DIAGNOSIS — D6851 Activated protein C resistance: Secondary | ICD-10-CM

## 2015-03-25 DIAGNOSIS — I2699 Other pulmonary embolism without acute cor pulmonale: Secondary | ICD-10-CM | POA: Diagnosis not present

## 2015-03-25 LAB — POCT INR: INR: 4

## 2015-03-27 ENCOUNTER — Telehealth: Payer: Self-pay | Admitting: Internal Medicine

## 2015-03-27 NOTE — Telephone Encounter (Signed)
Pt would like to transfer care from dr Ronnald Ramp to dr Danise Mina All the family comes to [facility]  Is ok to [place]??

## 2015-03-27 NOTE — Telephone Encounter (Signed)
yes

## 2015-03-28 NOTE — Telephone Encounter (Signed)
Noted. Can we schedule 52min f/u appt over next 1-2 months?

## 2015-03-31 ENCOUNTER — Telehealth: Payer: Self-pay | Admitting: *Deleted

## 2015-03-31 MED ORDER — EZETIMIBE-SIMVASTATIN 10-40 MG PO TABS: 1.0000 | ORAL_TABLET | Freq: Every day | ORAL | Status: DC

## 2015-03-31 NOTE — Telephone Encounter (Signed)
May 17 pt aware

## 2015-03-31 NOTE — Telephone Encounter (Signed)
Left msg on triage stating needing tio get rx sent for Vytorin to new mail order [facility]. Tried calling pt back no answer LMOM rx sent to [facility].....Johny Chess

## 2015-04-15 ENCOUNTER — Ambulatory Visit (INDEPENDENT_AMBULATORY_CARE_PROVIDER_SITE_OTHER): Payer: Managed Care, Other (non HMO) | Admitting: Surgery

## 2015-04-15 DIAGNOSIS — D6851 Activated protein C resistance: Secondary | ICD-10-CM | POA: Diagnosis not present

## 2015-04-15 DIAGNOSIS — Z7901 Long term (current) use of anticoagulants: Secondary | ICD-10-CM

## 2015-04-15 DIAGNOSIS — D682 Hereditary deficiency of other clotting factors: Secondary | ICD-10-CM | POA: Diagnosis not present

## 2015-04-15 DIAGNOSIS — I82409 Acute embolism and thrombosis of unspecified deep veins of unspecified lower extremity: Secondary | ICD-10-CM

## 2015-04-15 DIAGNOSIS — I2699 Other pulmonary embolism without acute cor pulmonale: Secondary | ICD-10-CM

## 2015-04-15 LAB — POCT INR: INR: 3

## 2015-05-01 ENCOUNTER — Other Ambulatory Visit: Payer: Self-pay | Admitting: *Deleted

## 2015-05-01 MED ORDER — EZETIMIBE-SIMVASTATIN 10-40 MG PO TABS: 1.0000 | ORAL_TABLET | Freq: Every day | ORAL | Status: DC

## 2015-05-21 ENCOUNTER — Ambulatory Visit (INDEPENDENT_AMBULATORY_CARE_PROVIDER_SITE_OTHER): Payer: Managed Care, Other (non HMO) | Admitting: Family Medicine

## 2015-05-21 ENCOUNTER — Encounter: Payer: Self-pay | Admitting: Family Medicine

## 2015-05-21 VITALS — BP 114/64 | HR 60 | Temp 98.1°F | Ht 67.5 in | Wt 169.5 lb

## 2015-05-21 DIAGNOSIS — D6851 Activated protein C resistance: Secondary | ICD-10-CM | POA: Diagnosis not present

## 2015-05-21 DIAGNOSIS — E785 Hyperlipidemia, unspecified: Secondary | ICD-10-CM

## 2015-05-21 DIAGNOSIS — Z86010 Personal history of colonic polyps: Secondary | ICD-10-CM

## 2015-05-21 NOTE — Assessment & Plan Note (Signed)
Chronic, stable. Continue vytorin.

## 2015-05-21 NOTE — Patient Instructions (Addendum)
Keep going to current coumadin clinic but ask me next visit if we can transition here.  Get me copy of latest colonoscopy Come back at your convenience for physical, prior fasting for blood work.

## 2015-05-21 NOTE — Assessment & Plan Note (Signed)
On coumadin per cardiology clinic. May transition to [facility] in future.  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

## 2015-05-21 NOTE — Assessment & Plan Note (Signed)
I have requested records of latest colonoscopy 12/2014.

## 2015-05-21 NOTE — Progress Notes (Signed)
Pre visit review using our clinic review tool, if applicable. No additional management support is needed unless otherwise documented below in the visit note. 

## 2015-05-21 NOTE — Progress Notes (Signed)
BP 114/64 mmHg  Pulse 60  Temp(Src) 98.1 F (36.7 C) (Oral)  Ht 5' 7.5" (1.715 m)  Wt 169 lb 8 oz (76.885 kg)  BMI 26.14 kg/m2   CC: here to transfer care from Dr Ronnald Ramp Subjective:    Patient ID: Evan Cabrera, male    DOB: 12-25-1960, 55 y.o.   MRN: [id number]  HPI: Evan Cabrera is a 55 y.o. male presenting on 05/21/2015 for Establish Care   Last seen 11/2014. More convenient here. H/o factor V leiden on chronic coumadin by cardiology clinic and prediabetes. H/o 3 DVT and 2 PE. Last DVT 2005 was after he missed one coumadin dose and ate broccoli. New range 2.5-3.5.   HLD - compliant with vytorin and fish oil without myalgias. Prior statin intolerance and niacin intolerance.   No chest pain, tightness, dyspnea, leg swelling.  He did have CPE 11/2014. We did have colonoscopy 12/2014. Will send me report.   Relevant past medical, surgical, family and social history reviewed and updated as indicated. Interim medical history since our last visit reviewed. Allergies and medications reviewed and updated. Current Outpatient Prescriptions on File Prior to Visit  Medication Sig  . ezetimibe-simvastatin (VYTORIN) 10-40 MG tablet Take 1 tablet by mouth at bedtime. Muat keep new appw w/new PCP for refills  . folic acid (FOLVITE) Q000111Q MCG tablet Take 800 mcg by mouth daily.    . Multiple Vitamin (MULTIVITAMIN) tablet Take 1 tablet by mouth daily.    . Omega-3 Fatty Acids (FISH OIL) 1200 MG CAPS Take 1,200 mg by mouth daily.    . vitamin C (ASCORBIC ACID) 500 MG tablet Take 500 mg by mouth daily.    Marland Kitchen warfarin (COUMADIN) 5 MG tablet TAKE 1 TABLET BY MOUTH EVERY DAY AS DIRECTED   No current facility-administered medications on file prior to visit.    Review of Systems Per HPI unless specifically indicated in ROS section     Objective:    BP 114/64 mmHg  Pulse 60  Temp(Src) 98.1 F (36.7 C) (Oral)  Ht 5' 7.5" (1.715 m)  Wt 169 lb 8 oz (76.885 kg)  BMI 26.14 kg/m2  Wt Readings from  Last 3 Encounters:  05/21/15 169 lb 8 oz (76.885 kg)  11/20/14 184 lb (83.462 kg)  04/01/14 189 lb (85.73 kg)    Physical Exam  Constitutional: He appears well-developed and well-nourished. No distress.  HENT:  Mouth/Throat: Oropharynx is clear and moist. No oropharyngeal exudate.  Cardiovascular: Normal rate, regular rhythm, normal heart sounds and intact distal pulses.   No murmur heard. Pulmonary/Chest: Effort normal and breath sounds normal. No respiratory distress. He has no wheezes. He has no rales.  Musculoskeletal: He exhibits no edema.  Skin: Skin is warm and dry. No rash noted.  Psychiatric: He has a normal mood and affect.  Nursing note and vitals reviewed.  Results for orders placed or performed in visit on 04/15/15  POCT INR  Result Value Ref Range   INR 3.0       Assessment & Plan:   Problem List Items Addressed This Visit    HLD (hyperlipidemia) - Primary    Chronic, stable. Continue vytorin.      Factor 5 Leiden mutation, heterozygous [facility])    On coumadin per cardiology clinic. May transition to [facility] in future.  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      Personal history of colonic polyps    I have requested records of latest  colonoscopy 12/2014.          Follow up plan: Return as needed, for annual exam, prior fasting for blood work.  [name], MD

## 2015-05-27 ENCOUNTER — Ambulatory Visit (INDEPENDENT_AMBULATORY_CARE_PROVIDER_SITE_OTHER): Payer: Managed Care, Other (non HMO) | Admitting: *Deleted

## 2015-05-27 DIAGNOSIS — Z7901 Long term (current) use of anticoagulants: Secondary | ICD-10-CM | POA: Diagnosis not present

## 2015-05-27 DIAGNOSIS — D6851 Activated protein C resistance: Secondary | ICD-10-CM | POA: Diagnosis not present

## 2015-05-27 DIAGNOSIS — I2699 Other pulmonary embolism without acute cor pulmonale: Secondary | ICD-10-CM

## 2015-05-27 DIAGNOSIS — I82409 Acute embolism and thrombosis of unspecified deep veins of unspecified lower extremity: Secondary | ICD-10-CM | POA: Diagnosis not present

## 2015-05-27 DIAGNOSIS — D682 Hereditary deficiency of other clotting factors: Secondary | ICD-10-CM | POA: Diagnosis not present

## 2015-05-27 LAB — POCT INR: INR: 1.5

## 2015-06-10 ENCOUNTER — Ambulatory Visit (INDEPENDENT_AMBULATORY_CARE_PROVIDER_SITE_OTHER): Payer: Managed Care, Other (non HMO)

## 2015-06-10 DIAGNOSIS — Z7901 Long term (current) use of anticoagulants: Secondary | ICD-10-CM | POA: Diagnosis not present

## 2015-06-10 DIAGNOSIS — I82409 Acute embolism and thrombosis of unspecified deep veins of unspecified lower extremity: Secondary | ICD-10-CM

## 2015-06-10 DIAGNOSIS — I2699 Other pulmonary embolism without acute cor pulmonale: Secondary | ICD-10-CM | POA: Diagnosis not present

## 2015-06-10 DIAGNOSIS — D682 Hereditary deficiency of other clotting factors: Secondary | ICD-10-CM

## 2015-06-10 DIAGNOSIS — D6851 Activated protein C resistance: Secondary | ICD-10-CM

## 2015-06-10 LAB — POCT INR: INR: 1.3

## 2015-06-10 LAB — HM DIABETES EYE EXAM

## 2015-06-10 MED ORDER — WARFARIN SODIUM 5 MG PO TABS: ORAL_TABLET | ORAL | Status: DC

## 2015-06-11 ENCOUNTER — Encounter (HOSPITAL_COMMUNITY): Payer: Self-pay | Admitting: Emergency Medicine

## 2015-06-11 ENCOUNTER — Telehealth: Payer: Self-pay | Admitting: Family Medicine

## 2015-06-11 ENCOUNTER — Observation Stay (HOSPITAL_COMMUNITY)
Admission: EM | Admit: 2015-06-11 | Discharge: 2015-06-13 | Disposition: A | Discharge status: Home / Self Care | Payer: Managed Care, Other (non HMO) | Attending: Internal Medicine | Admitting: Internal Medicine

## 2015-06-11 ENCOUNTER — Emergency Department (HOSPITAL_COMMUNITY)
Admission: EM | Admit: 2015-06-11 | Discharge: 2015-06-11 | Disposition: A | Discharge status: Home / Self Care | Payer: Managed Care, Other (non HMO) | Source: Home / Self Care | Attending: Emergency Medicine | Admitting: Emergency Medicine

## 2015-06-11 DIAGNOSIS — F329 Major depressive disorder, single episode, unspecified: Secondary | ICD-10-CM | POA: Insufficient documentation

## 2015-06-11 DIAGNOSIS — Z79899 Other long term (current) drug therapy: Secondary | ICD-10-CM | POA: Insufficient documentation

## 2015-06-11 DIAGNOSIS — IMO0002 Reserved for concepts with insufficient information to code with codable children: Secondary | ICD-10-CM

## 2015-06-11 DIAGNOSIS — E1165 Type 2 diabetes mellitus with hyperglycemia: Principal | ICD-10-CM | POA: Insufficient documentation

## 2015-06-11 DIAGNOSIS — Z7901 Long term (current) use of anticoagulants: Secondary | ICD-10-CM

## 2015-06-11 DIAGNOSIS — Z7984 Long term (current) use of oral hypoglycemic drugs: Secondary | ICD-10-CM | POA: Insufficient documentation

## 2015-06-11 DIAGNOSIS — R197 Diarrhea, unspecified: Secondary | ICD-10-CM | POA: Diagnosis present

## 2015-06-11 DIAGNOSIS — R739 Hyperglycemia, unspecified: Secondary | ICD-10-CM

## 2015-06-11 DIAGNOSIS — D6851 Activated protein C resistance: Secondary | ICD-10-CM | POA: Diagnosis present

## 2015-06-11 DIAGNOSIS — R112 Nausea with vomiting, unspecified: Secondary | ICD-10-CM | POA: Diagnosis present

## 2015-06-11 DIAGNOSIS — E118 Type 2 diabetes mellitus with unspecified complications: Secondary | ICD-10-CM

## 2015-06-11 LAB — COMPREHENSIVE METABOLIC PANEL
ALK PHOS: 67 U/L (ref 38–126)
ALT: 28 U/L (ref 17–63)
ANION GAP: 12 (ref 5–15)
AST: 17 U/L (ref 15–41)
Albumin: 4.8 g/dL (ref 3.5–5.0)
BILIRUBIN TOTAL: 1 mg/dL (ref 0.3–1.2)
BUN: 19 mg/dL (ref 6–20)
CALCIUM: 9.3 mg/dL (ref 8.9–10.3)
CO2: 26 mmol/L (ref 22–32)
Chloride: 100 mmol/L — ABNORMAL LOW (ref 101–111)
Creatinine, Ser: 1.02 mg/dL (ref 0.61–1.24)
GFR calc non Af Amer: >60 mL/min (ref >60)
GLUCOSE: 365 mg/dL — AB (ref 65–99)
POTASSIUM: 4 mmol/L (ref 3.5–5.1)
Sodium: 138 mmol/L (ref 135–145)
TOTAL PROTEIN: 7.5 g/dL (ref 6.5–8.1)

## 2015-06-11 LAB — CBC
HCT: 40.4 % (ref 39.0–52.0)
HEMATOCRIT: 42.5 % (ref 39.0–52.0)
Hemoglobin: 15.4 g/dL (ref 13.0–17.0)
Hemoglobin: 14.7 g/dL (ref 13.0–17.0)
MCH: 31.2 pg (ref 26.0–34.0)
MCH: 31.2 pg (ref 26.0–34.0)
MCHC: 36.2 g/dL — AB (ref 30.0–36.0)
MCHC: 36.4 g/dL — ABNORMAL HIGH (ref 30.0–36.0)
MCV: 86.2 fL (ref 78.0–100.0)
MCV: 85.8 fL (ref 78.0–100.0)
PLATELETS: 169 10*3/uL (ref 150–400)
Platelets: 172 10*3/uL (ref 150–400)
RBC: 4.93 MIL/uL (ref 4.22–5.81)
RBC: 4.71 MIL/uL (ref 4.22–5.81)
RDW: 12.6 % (ref 11.5–15.5)
RDW: 12.7 % (ref 11.5–15.5)
WBC: 5.9 10*3/uL (ref 4.0–10.5)
WBC: 5.4 10*3/uL (ref 4.0–10.5)

## 2015-06-11 LAB — BASIC METABOLIC PANEL
Anion gap: 10 (ref 5–15)
BUN: 16 mg/dL (ref 6–20)
CALCIUM: 8.7 mg/dL — AB (ref 8.9–10.3)
CHLORIDE: 105 mmol/L (ref 101–111)
CO2: 21 mmol/L — ABNORMAL LOW (ref 22–32)
CREATININE: 0.99 mg/dL (ref 0.61–1.24)
Glucose, Bld: 359 mg/dL — ABNORMAL HIGH (ref 65–99)
Potassium: 3.9 mmol/L (ref 3.5–5.1)
SODIUM: 136 mmol/L (ref 135–145)

## 2015-06-11 LAB — CBG MONITORING, ED
GLUCOSE-CAPILLARY: 285 mg/dL — AB (ref 65–99)
GLUCOSE-CAPILLARY: 332 mg/dL — AB (ref 65–99)
Glucose-Capillary: 330 mg/dL — ABNORMAL HIGH (ref 65–99)

## 2015-06-11 MED ORDER — METFORMIN HCL 500 MG PO TABS: 500.0000 mg | ORAL_TABLET | Freq: Two times a day (BID) | ORAL | Status: DC

## 2015-06-11 MED ORDER — SODIUM CHLORIDE 0.9 % IV BOLUS (SEPSIS)
2000.0000 mL | Freq: Once | INTRAVENOUS | Status: AC
  Administered 2015-06-11: 2000 mL via INTRAVENOUS

## 2015-06-11 MED ORDER — METFORMIN HCL 500 MG PO TABS
500.0000 mg | ORAL_TABLET | Freq: Once | ORAL | Status: AC
Start: 2015-06-11 — End: 2015-06-11
  Administered 2015-06-11: 500 mg via ORAL
  Filled 2015-06-11: qty 1

## 2015-06-11 NOTE — Telephone Encounter (Signed)
Noted  

## 2015-06-11 NOTE — Discharge Instructions (Signed)
Hyperglycemia °Hyperglycemia occurs when the glucose (sugar) in your blood is too high. Hyperglycemia can happen for many reasons, but it most often happens to people who do not know they have diabetes or are not managing their diabetes properly.  °CAUSES  °Whether you have diabetes or not, there are other causes of hyperglycemia. Hyperglycemia can occur when you have diabetes, but it can also occur in other situations that you might not be as aware of, such as: °Diabetes °· If you have diabetes and are having problems controlling your blood glucose, hyperglycemia could occur because of some of the following reasons: °¨ Not following your meal plan. °¨ Not taking your diabetes medications or not taking it properly. °¨ Exercising less or doing less activity than you normally do. °¨ Being sick. °Pre-diabetes °· This cannot be ignored. Before people develop Type 2 diabetes, they almost always have "pre-diabetes." This is when your blood glucose levels are higher than normal, but not yet high enough to be diagnosed as diabetes. Research has shown that some long-term damage to the body, especially the heart and circulatory system, may already be occurring during pre-diabetes. If you take action to manage your blood glucose when you have pre-diabetes, you may delay or prevent Type 2 diabetes from developing. °Stress °· If you have diabetes, you may be "diet" controlled or on oral medications or insulin to control your diabetes. However, you may find that your blood glucose is higher than usual in the hospital whether you have diabetes or not. This is often referred to as "stress hyperglycemia." Stress can elevate your blood glucose. This happens because of hormones put out by the body during times of stress. If stress has been the cause of your high blood glucose, it can be followed regularly by your caregiver. That way he/she can make sure your hyperglycemia does not continue to get worse or progress to  diabetes. °Steroids °· Steroids are medications that act on the infection fighting system (immune system) to block inflammation or infection. One side effect can be a rise in blood glucose. Most people can produce enough extra insulin to allow for this rise, but for those who cannot, steroids make blood glucose levels go even higher. It is not unusual for steroid treatments to "uncover" diabetes that is developing. It is not always possible to determine if the hyperglycemia will go away after the steroids are stopped. A special blood test called an A1c is sometimes done to determine if your blood glucose was elevated before the steroids were started. °SYMPTOMS °· Thirsty. °· Frequent urination. °· Dry mouth. °· Blurred vision. °· Tired or fatigue. °· Weakness. °· Sleepy. °· Tingling in feet or leg. °DIAGNOSIS  °Diagnosis is made by monitoring blood glucose in one or all of the following ways: °· A1c test. This is a chemical found in your blood. °· Fingerstick blood glucose monitoring. °· Laboratory results. °TREATMENT  °First, knowing the cause of the hyperglycemia is important before the hyperglycemia can be treated. Treatment may include, but is not be limited to: °· Education. °· Change or adjustment in medications. °· Change or adjustment in meal plan. °· Treatment for an illness, infection, etc. °· More frequent blood glucose monitoring. °· Change in exercise plan. °· Decreasing or stopping steroids. °· Lifestyle changes. °HOME CARE INSTRUCTIONS  °· Test your blood glucose as directed. °· Exercise regularly. Your caregiver will give you instructions about exercise. Pre-diabetes or diabetes which comes on with stress is helped by exercising. °· Eat wholesome,   balanced meals. Eat often and at regular, fixed times. Your caregiver or nutritionist will give you a meal plan to guide your sugar intake. °· Being at an ideal weight is important. If needed, losing as little as 10 to 15 pounds may help improve blood  glucose levels. °SEEK MEDICAL CARE IF:  °· You have questions about medicine, activity, or diet. °· You continue to have symptoms (problems such as increased thirst, urination, or weight gain). °SEEK IMMEDIATE MEDICAL CARE IF:  °· You are vomiting or have diarrhea. °· Your breath smells fruity. °· You are breathing faster or slower. °· You are very sleepy or incoherent. °· You have numbness, tingling, or pain in your feet or hands. °· You have chest pain. °· Your symptoms get worse even though you have been following your caregiver's orders. °· If you have any other questions or concerns. °  °This information is not intended to replace advice given to you by your health care provider. Make sure you discuss any questions you have with your health care provider. °  °Document Released: 06/16/2000 Document Revised: 03/15/2011 Document Reviewed: 08/27/2014 °Elsevier Interactive Patient Education ©2016 [employer] ° °

## 2015-06-11 NOTE — ED Provider Notes (Signed)
CSN: [id number]     Arrival date & time 06/11/15  2250 History  By signing my name below, I, [facility], attest that this documentation has been prepared under the direction and in the presence of Merryl Hacker, MD. Electronically Signed: Virgel Bouquet, ED Scribe. 06/12/2015. 1:09 AM.     No chief complaint on file.   The history is provided by the patient. No language interpreter was used.   HPI Comments: Evan Cabrera is a 55 y.o. male with an hx of HLN who presents to the Emergency Department complaining of intermittent, moderate, unchanging emesis that occurred earlier this evening. Pt states that he was evaluated yesterday at the [facility] ED by Dr. Jola Schmidt for fatigue, diagnosed with DM (CBGs in the 200s), treated with metformin and discharged home the same. He notes that he took metformin this evening, had multiple episodes of emesis and watery diarrhea after which he checked his CBG at home and that it was in the 400s. He reports polyuria for 3 months, polydipsia for 3 months, unexpected weight loss 24 lbs in the past 3 months. Denies prior history of DM or recent illnesses. Denies CP, abdominal pain, fevers, or any other symptoms currently.  Past Medical History  Diagnosis Date  . Hyperlipemia   . 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 Benign prostatic hypertrophy history  . Torn ACL 2008    right - no surgery due to FVL  . Factor 5 Leiden mutation, heterozygous ([place])   . Pulmonary embolism ([place]) [id number]  . DVT (deep venous thrombosis) ([place]) 2005  . Depression 2005  . GERD (gastroesophageal reflux disease)    Past Surgical History  Procedure Laterality Date  . Vasectomy  1988  . Knee surgery Right 2003    for cartilage  . Wisdom tooth extraction    . Colonoscopy  10/2011    Dr Glennon Hamilton  . Colonoscopy  12/2014    rpt 5 yrs Glennon Hamilton)   Family History  Problem Relation Age of Onset  . Heart attack Father 37    CBAG X1  . Deep vein thrombosis Father   .  Diabetes Father   . Uterine cancer Mother   . Hypertension Mother   . Hyperlipidemia Mother   . Heart disease Mother     3 vessel CBAG @ 105  . Diabetes Brother   . Heart attack Brother 39    Diabetic  . Pulmonary embolism Maternal Grandfather   . Stroke Neg Hx    Social History  Substance Use Topics  . Smoking status: Never Smoker   . Smokeless tobacco: Never Used  . Alcohol Use: No    Review of Systems  Constitutional: Positive for unexpected weight change. Negative for fever.  Cardiovascular: Negative for chest pain.  Gastrointestinal: Positive for vomiting and diarrhea. Negative for abdominal pain.  Endocrine: Positive for polydipsia and polyuria.  All other systems reviewed and are negative.     Allergies  Niacin; Crestor; and Codeine  Home Medications   Prior to Admission medications   Medication Sig Start Date End Date Taking? Authorizing Provider  ezetimibe-simvastatin (VYTORIN) 10-40 MG tablet Take 1 tablet by mouth at bedtime. Muat keep new appw w/new PCP for refills 05/01/15  Yes Janith Lima, MD  folic acid (FOLVITE) Q000111Q MCG tablet Take 800 mcg by mouth daily.     Yes Historical Provider, MD  metFORMIN (GLUCOPHAGE) 500 MG tablet Take 1 tablet (500 mg total) by mouth 2 (two) times daily with a  meal. 06/11/15  Yes Jola Schmidt, MD  Multiple Vitamin (MULTIVITAMIN) tablet Take 1 tablet by mouth daily.     Yes Historical Provider, MD  Omega-3 Fatty Acids (FISH OIL) 1200 MG CAPS Take 1,200 mg by mouth daily.     Yes Historical Provider, MD  vitamin C (ASCORBIC ACID) 500 MG tablet Take 500 mg by mouth daily.     Yes Historical Provider, MD  warfarin (COUMADIN) 5 MG tablet Take as directed by Coumadin Clinic Patient taking differently: Take 10mg s today, take 5mg  Thursday, take 7.5mg s Friday. Then take 5mg  every day except Monday and Fridays take 7.5mg s 06/10/15  Yes Evans Lance, MD   BP 139/86 mmHg  Pulse 78  Temp(Src) 98.1 F (36.7 C) (Oral)  SpO2 99% Physical  Exam  Constitutional: He is oriented to person, place, and time. He appears well-developed and well-nourished. No distress.  HENT:  Head: Normocephalic and atraumatic.  Cardiovascular: Normal rate, regular rhythm and normal heart sounds.   No murmur heard. Pulmonary/Chest: Effort normal and breath sounds normal. No respiratory distress. He has no wheezes.  Abdominal: Soft. Bowel sounds are normal. There is no tenderness. There is no rebound.  Musculoskeletal: He exhibits no edema.  Neurological: He is alert and oriented to person, place, and time.  Skin: Skin is warm and dry.  Psychiatric: He has a normal mood and affect.  Nursing note and vitals reviewed.   ED Course  Procedures (including critical care time)  DIAGNOSTIC STUDIES: Oxygen Saturation is 99% on RA, normal by my interpretation.    COORDINATION OF CARE: 12:14 AM Will order IV fluids, Zofran, and labs. Discussed treatment plan with pt at bedside and pt agreed to plan.  1:06 AM Consulted with hospitalist. Will admit pt to hospital.  Labs Review Labs Reviewed  BASIC METABOLIC PANEL - Abnormal; Notable for the following:    CO2 21 (*)    Glucose, Bld 359 (*)    Calcium 8.7 (*)    All other components within normal limits  CBC - Abnormal; Notable for the following:    MCHC 36.4 (*)    All other components within normal limits  CBG MONITORING, ED - Abnormal; Notable for the following:    Glucose-Capillary 332 (*)    All other components within normal limits  URINALYSIS, ROUTINE W REFLEX MICROSCOPIC (NOT AT [facility])  TROPONIN I    I have personally reviewed and evaluated these lab results as part of my medical decision-making.   EKG Interpretation   Date/Time:  Thursday June 12 2015 00:55:26 EDT Ventricular Rate:  59 PR Interval:  172 QRS Duration: 109 QT Interval:  411 QTC Calculation: 407 R Axis:   49 Text Interpretation:  Sinus rhythm Abnormal R-wave progression, early  transition Confirmed by Zimri Brennen  MD,  Cecilia ([id number]) on 06/12/2015 1:01:32  AM      MDM   Final diagnoses:  Hyperglycemia    Patient presents with new onset diabetes. No weight loss, thirst, urination. Was seen earlier today and discharged on metformin. Had persistently elevated blood sugars at home despite medication. Also reported vomiting. He is nontoxic on exam. Nonfocal. Abdominal exam is benign. EKG is nonischemic. Basic labwork shows that he is not in DKA. He was given fluids. Given that this is his second visit and his blood sugars were persistently elevated despite medications, feel he would benefit from admission. He may need insulin for blood sugar management.  I personally performed the services described in this documentation, which was scribed  in my presence. The recorded information has been reviewed and is accurate.    Merryl Hacker, MD 06/12/15 [phone]

## 2015-06-11 NOTE — Telephone Encounter (Signed)
[facility]  Patient Name: THAER GONGWER  DOB: July 28, 1960    Initial Comment Caller states hasn't been feeling well, his blood sugar is 444, not a diabetic, checked it again and it was 423. He has not eaten this morning.   Nurse Assessment  Nurse: Wayne Sever, RN, Tillie Rung Date/Time (Eastern Time): 06/11/2015 11:01:19 AM  Confirm and document reason for call. If symptomatic, describe symptoms. You must click the next button to save text entered. ---Caller states his blood sugar is 423 and he is not a diabetic. He states for the last few weeks he has been very thirsty and is urinating frequently. He states he has also felt weak at times. He states he has also been having trouble with his eyes.  Has the patient traveled out of the country within the last 30 days? ---Not Applicable  Does the patient have any new or worsening symptoms? ---Yes  Will a triage be completed? ---Yes  Related visit to physician within the last 2 weeks? ---No  Does the PT have any chronic conditions? (i.e. diabetes, asthma, etc.) ---Yes  List chronic conditions. ---Factor 5, Pulmonary Embolism, no clots since 2005  Is this a behavioral health or substance abuse call? ---No     Guidelines    Guideline Title Affirmed Question Affirmed Notes  Diabetes - High Blood Sugar Blood glucose > 400 mg/dl (22 mmol/l)    Final Disposition User   Call PCP Now Wayne Sever, RN, Tillie Rung    Comments  No appointments left for today, caller going to ED   Referrals  [facility] - ED   Disagree/Comply: Comply

## 2015-06-11 NOTE — ED Notes (Signed)
Pt c/o increased thirst, urination, and weight loss x 3 months. Home CBG 444. Denies N/V.

## 2015-06-11 NOTE — Telephone Encounter (Signed)
Per chart review tab pt is at [facility] ED.

## 2015-06-11 NOTE — ED Notes (Signed)
Patient presents for hyperglycemia, seen earlier today for same, no previous history of DM. Reports 3-4 episodes of emesis, and 4 episodes of watery diarrhea. Reports CBGs at home since discharge have been in the 400s.

## 2015-06-11 NOTE — Telephone Encounter (Signed)
ER note reviewed. plz call to schedule f/u appt in next 1-2 wks with me for newly diagnosed diabetes.

## 2015-06-11 NOTE — ED Notes (Signed)
PT DISCHARGED. INSTRUCTIONS AND PRESCRIPTION GIVEN. AAOX4. PT IN NO APPARENT DISTRESS OR PAIN. THE OPPORTUNITY TO ASK QUESTIONS WAS PROVIDED. 

## 2015-06-11 NOTE — ED Notes (Signed)
Pt went to restroom was not aware he needed urine he said he will try again soon

## 2015-06-11 NOTE — ED Provider Notes (Signed)
CSN: [id number]     Arrival date & time 06/11/15  1142 History   First MD Initiated Contact with Patient 06/11/15 1152     Chief Complaint  Patient presents with  . Hyperglycemia     The history is provided by the patient.  increasing thirst and urination with associated weight loss over the past 3 months. No prior history of diabetes.  He does have a glucometer at home however as he was prediabetes at some point in the past.  He is active and attempts to eat a healthy diet.  No recent illness.  This morning he did not feel right and he checked his blood sugar home and found it to be in the 400s and called his primary care doctor who recommended he come to the ER for evaluation.   Past Medical History  Diagnosis Date  . Hyperlipemia   . 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 Benign prostatic hypertrophy history  . Torn ACL 2008    right - no surgery due to FVL  . Factor 5 Leiden mutation, heterozygous ([place])   . Pulmonary embolism ([place]) [id number]  . DVT (deep venous thrombosis) ([place]) 2005  . Depression 2005  . GERD (gastroesophageal reflux disease)    Past Surgical History  Procedure Laterality Date  . Vasectomy  1988  . Knee surgery Right 2003    for cartilage  . Wisdom tooth extraction    . Colonoscopy  10/2011    Dr Glennon Hamilton  . Colonoscopy  12/2014    rpt 5 yrs Glennon Hamilton)   Family History  Problem Relation Age of Onset  . Heart attack Father 20    CBAG X1  . Deep vein thrombosis Father   . Diabetes Father   . Uterine cancer Mother   . Hypertension Mother   . Hyperlipidemia Mother   . Heart disease Mother     3 vessel CBAG @ 85  . Diabetes Brother   . Heart attack Brother 35    Diabetic  . Pulmonary embolism Maternal Grandfather   . Stroke Neg Hx    Social History  Substance Use Topics  . Smoking status: Never Smoker   . Smokeless tobacco: Never Used  . Alcohol Use: No    Review of Systems  All other systems reviewed and are negative.     Allergies  Niacin; Crestor;  and Codeine  Home Medications   Prior to Admission medications   Medication Sig Start Date End Date Taking? Authorizing Provider  ezetimibe-simvastatin (VYTORIN) 10-40 MG tablet Take 1 tablet by mouth at bedtime. Muat keep new appw w/new PCP for refills 05/01/15   Janith Lima, MD  folic acid (FOLVITE) Q000111Q MCG tablet Take 800 mcg by mouth daily.      Historical Provider, MD  Multiple Vitamin (MULTIVITAMIN) tablet Take 1 tablet by mouth daily.      Historical Provider, MD  Omega-3 Fatty Acids (FISH OIL) 1200 MG CAPS Take 1,200 mg by mouth daily.      Historical Provider, MD  vitamin C (ASCORBIC ACID) 500 MG tablet Take 500 mg by mouth daily.      Historical Provider, MD  warfarin (COUMADIN) 5 MG tablet Take as directed by Coumadin Clinic 06/10/15   Evans Lance, MD   BP 132/82 mmHg  Pulse 72  Temp(Src) 98 F (36.7 C) (Oral)  Resp 14  SpO2 100% Physical Exam  Constitutional: He is oriented to person, place, and time. He appears well-developed and well-nourished.  HENT:  Head: Normocephalic.  Eyes: EOM are normal.  Neck: Normal range of motion.  Pulmonary/Chest: Effort normal.  Abdominal: He exhibits no distension.  Musculoskeletal: Normal range of motion.  Neurological: He is alert and oriented to person, place, and time.  Psychiatric: He has a normal mood and affect.  Nursing note and vitals reviewed.   ED Course  Procedures (including critical care time) Labs Review Labs Reviewed  CBC - Abnormal; Notable for the following:    MCHC 36.2 (*)    All other components within normal limits  COMPREHENSIVE METABOLIC PANEL - Abnormal; Notable for the following:    Chloride 100 (*)    Glucose, Bld 365 (*)    All other components within normal limits  CBG MONITORING, ED - Abnormal; Notable for the following:    Glucose-Capillary 330 (*)    All other components within normal limits  CBG MONITORING, ED - Abnormal; Notable for the following:    Glucose-Capillary 285 (*)    All  other components within normal limits  HEMOGLOBIN A1C    Imaging Review No results found. I have personally reviewed and evaluated these images and lab results as part of my medical decision-making.   EKG Interpretation None      MDM   Final diagnoses:  None    New-onset diabetes.  Feels better after IV fluids.  Blood sugar improving.  Hemoglobin A1c sent.  Patient be discharged home on metformin.  Primary care and endocrinology follow-up.  I've also recommended that he check his blood sugar 3 times a day.  He has a glucometer and strips at home.  I've also recommended following up with a nutritionalist to help him learn more about a diabetic diet    Jola Schmidt, MD 06/11/15 1541

## 2015-06-12 ENCOUNTER — Encounter (HOSPITAL_COMMUNITY): Payer: Self-pay | Admitting: Family Medicine

## 2015-06-12 DIAGNOSIS — D6851 Activated protein C resistance: Secondary | ICD-10-CM | POA: Diagnosis not present

## 2015-06-12 DIAGNOSIS — E1165 Type 2 diabetes mellitus with hyperglycemia: Secondary | ICD-10-CM

## 2015-06-12 DIAGNOSIS — IMO0002 Reserved for concepts with insufficient information to code with codable children: Secondary | ICD-10-CM

## 2015-06-12 DIAGNOSIS — R112 Nausea with vomiting, unspecified: Secondary | ICD-10-CM | POA: Diagnosis present

## 2015-06-12 DIAGNOSIS — R739 Hyperglycemia, unspecified: Secondary | ICD-10-CM | POA: Diagnosis present

## 2015-06-12 DIAGNOSIS — E118 Type 2 diabetes mellitus with unspecified complications: Secondary | ICD-10-CM

## 2015-06-12 DIAGNOSIS — R197 Diarrhea, unspecified: Secondary | ICD-10-CM | POA: Diagnosis present

## 2015-06-12 DIAGNOSIS — E119 Type 2 diabetes mellitus without complications: Secondary | ICD-10-CM | POA: Diagnosis not present

## 2015-06-12 LAB — URINALYSIS, ROUTINE W REFLEX MICROSCOPIC
BILIRUBIN URINE: NEGATIVE
Hgb urine dipstick: NEGATIVE
Leukocytes, UA: NEGATIVE
Nitrite: NEGATIVE
PH: 5.5 (ref 5.0–8.0)
Protein, ur: NEGATIVE mg/dL
Specific Gravity, Urine: 1.034 — ABNORMAL HIGH (ref 1.005–1.030)

## 2015-06-12 LAB — TROPONIN I: Troponin I: <0.03 ng/mL (ref <0.031)

## 2015-06-12 LAB — URINE MICROSCOPIC-ADD ON
BACTERIA UA: NONE SEEN
RBC / HPF: NONE SEEN RBC/hpf (ref 0–5)
WBC, UA: NONE SEEN WBC/hpf (ref 0–5)

## 2015-06-12 LAB — CBG MONITORING, ED: Glucose-Capillary: 370 mg/dL — ABNORMAL HIGH (ref 65–99)

## 2015-06-12 LAB — GLUCOSE, CAPILLARY
GLUCOSE-CAPILLARY: 306 mg/dL — AB (ref 65–99)
GLUCOSE-CAPILLARY: 333 mg/dL — AB (ref 65–99)
Glucose-Capillary: 298 mg/dL — ABNORMAL HIGH (ref 65–99)
Glucose-Capillary: 206 mg/dL — ABNORMAL HIGH (ref 65–99)
Glucose-Capillary: 232 mg/dL — ABNORMAL HIGH (ref 65–99)

## 2015-06-12 LAB — PROTIME-INR
INR: 1.67 — ABNORMAL HIGH (ref 0.00–1.49)
PROTHROMBIN TIME: 19.7 s — AB (ref 11.6–15.2)

## 2015-06-12 MED ORDER — METFORMIN HCL 500 MG PO TABS
500.0000 mg | ORAL_TABLET | Freq: Two times a day (BID) | ORAL | Status: DC
Start: 2015-06-12 — End: 2015-06-12
  Administered 2015-06-12 (×2): 500 mg via ORAL
  Filled 2015-06-12 (×2): qty 1

## 2015-06-12 MED ORDER — WARFARIN SODIUM 2.5 MG PO TABS
2.5000 mg | ORAL_TABLET | ORAL | Status: AC
  Administered 2015-06-12: 2.5 mg via ORAL
  Filled 2015-06-12: qty 1

## 2015-06-12 MED ORDER — FOLIC ACID 1 MG PO TABS
1000.0000 ug | ORAL_TABLET | Freq: Every day | ORAL | Status: DC
  Administered 2015-06-12 – 2015-06-13 (×2): 1 mg via ORAL
  Filled 2015-06-12 (×2): qty 1

## 2015-06-12 MED ORDER — SODIUM CHLORIDE 0.9 % IV BOLUS (SEPSIS)
1000.0000 mL | Freq: Once | INTRAVENOUS | Status: AC
  Administered 2015-06-12: 1000 mL via INTRAVENOUS

## 2015-06-12 MED ORDER — EZETIMIBE-SIMVASTATIN 10-40 MG PO TABS
1.0000 | ORAL_TABLET | Freq: Every day | ORAL | Status: DC
Start: 2015-06-12 — End: 2015-06-13
  Administered 2015-06-12: 1 via ORAL
  Filled 2015-06-12 (×2): qty 1

## 2015-06-12 MED ORDER — WARFARIN - PHARMACIST DOSING INPATIENT: Freq: Every day | Status: DC

## 2015-06-12 MED ORDER — WARFARIN SODIUM 5 MG PO TABS
5.0000 mg | ORAL_TABLET | ORAL | Status: AC
  Administered 2015-06-12: 5 mg via ORAL
  Filled 2015-06-12: qty 1

## 2015-06-12 MED ORDER — LIVING WELL WITH DIABETES BOOK
Freq: Once | Status: AC
  Administered 2015-06-12: 13:00:00
  Filled 2015-06-12: qty 1

## 2015-06-12 MED ORDER — ACETAMINOPHEN 325 MG PO TABS
650.0000 mg | ORAL_TABLET | Freq: Four times a day (QID) | ORAL | Status: DC | PRN
Start: 2015-06-12 — End: 2015-06-13
  Administered 2015-06-13: 650 mg via ORAL
  Filled 2015-06-12: qty 2

## 2015-06-12 MED ORDER — ADULT MULTIVITAMIN W/MINERALS CH
1.0000 | ORAL_TABLET | Freq: Every day | ORAL | Status: DC
  Administered 2015-06-12 – 2015-06-13 (×2): 1 via ORAL
  Filled 2015-06-12 (×2): qty 1

## 2015-06-12 MED ORDER — VITAMIN C 500 MG PO TABS
500.0000 mg | ORAL_TABLET | Freq: Every day | ORAL | Status: DC
  Administered 2015-06-12 – 2015-06-13 (×2): 500 mg via ORAL
  Filled 2015-06-12 (×2): qty 1

## 2015-06-12 MED ORDER — WARFARIN SODIUM 5 MG PO TABS: 5.0000 mg | ORAL_TABLET | Freq: Every day | ORAL | Status: DC

## 2015-06-12 MED ORDER — ONDANSETRON HCL 4 MG/2ML IJ SOLN
4.0000 mg | Freq: Once | INTRAMUSCULAR | Status: AC
  Administered 2015-06-12: 4 mg via INTRAVENOUS
  Filled 2015-06-12: qty 2

## 2015-06-12 MED ORDER — METFORMIN HCL 500 MG PO TABS
1000.0000 mg | ORAL_TABLET | Freq: Two times a day (BID) | ORAL | Status: DC
  Administered 2015-06-13: 1000 mg via ORAL
  Filled 2015-06-12: qty 2

## 2015-06-12 MED ORDER — OMEGA-3-ACID ETHYL ESTERS 1 G PO CAPS
1.0000 g | ORAL_CAPSULE | Freq: Every day | ORAL | Status: DC
  Administered 2015-06-12 – 2015-06-13 (×2): 1 g via ORAL
  Filled 2015-06-12 (×2): qty 1

## 2015-06-12 MED ORDER — INSULIN ASPART 100 UNIT/ML ~~LOC~~ SOLN
0.0000 [IU] | SUBCUTANEOUS | Status: DC
  Administered 2015-06-12: 5 [IU] via SUBCUTANEOUS
  Administered 2015-06-12 (×2): 3 [IU] via SUBCUTANEOUS
  Administered 2015-06-12 (×2): 7 [IU] via SUBCUTANEOUS
  Administered 2015-06-13: 5 [IU] via SUBCUTANEOUS
  Administered 2015-06-13: 2 [IU] via SUBCUTANEOUS
  Administered 2015-06-13: 1 [IU] via SUBCUTANEOUS
  Administered 2015-06-13: 2 [IU] via SUBCUTANEOUS

## 2015-06-12 MED ORDER — ONDANSETRON HCL 4 MG PO TABS: 4.0000 mg | ORAL_TABLET | Freq: Four times a day (QID) | ORAL | Status: DC | PRN

## 2015-06-12 MED ORDER — ONDANSETRON HCL 4 MG/2ML IJ SOLN
4.0000 mg | Freq: Four times a day (QID) | INTRAMUSCULAR | Status: DC | PRN
Start: 2015-06-12 — End: 2015-06-13

## 2015-06-12 MED ORDER — SODIUM CHLORIDE 0.9 % IV SOLN
INTRAVENOUS | Status: AC
  Administered 2015-06-12 (×2): via INTRAVENOUS

## 2015-06-12 MED ORDER — ACETAMINOPHEN 650 MG RE SUPP
650.0000 mg | Freq: Four times a day (QID) | RECTAL | Status: DC | PRN
Start: 2015-06-12 — End: 2015-06-13

## 2015-06-12 NOTE — Progress Notes (Signed)
Inpatient Diabetes Program Recommendations  AACE/ADA: New Consensus Statement on Inpatient Glycemic Control (2015)  Target Ranges:  Prepandial:   less than 140 mg/dL      Peak postprandial:   less than 180 mg/dL (1-2 hours)      Critically ill patients:  140 - 180 mg/dL   Lab Results  Component Value Date   GLUCAP 232* 06/12/2015   HGBA1C 6.3 11/20/2014    Review of Glycemic Control  Diabetes history: Pre-diabetes with HgbA1C of 6.3% Outpatient Diabetes medications: None - started on metformin 500 mg bid on 6/7 Current orders for Inpatient glycemic control: metformin 500 mg bid, Novolog sensitive Q4H HgbA1C pending. In ED - + ketones, CO2 - 21, AG - 10  Inpatient Diabetes Program Recommendations:    Change Novolog to moderate tidwc and hs Add Levemir 12 units QHS Will order OP DIabetes Education consult for new-onset DM  Spoke with pt and wife regarding new diagnosis of DM. Pt has meter and instructed to check blood sugars 3-4 times/day.  States he is willing to go home on basal insulin if needed. Awaiting results of HgbA1C.  Reviewed glucose and A1C goals and explained that patient will need to continue to  Reviewed signs and symptoms of hyperglycemia and hypoglycemia along with treatment for both. Discussed impact of nutrition, exercise, stress, sickness, and medications on diabetes control. Reviewed Living Well with diabetes booklet and encouraged patient to read through entire book. Will see pt in am prior to discharge. Encouraged pt to write down any questions he might have.  Follow up in am. Thank you. Lorenda Peck, RD, LDN, CDE Inpatient Diabetes Coordinator [phone]

## 2015-06-12 NOTE — Progress Notes (Signed)
ANTICOAGULATION CONSULT NOTE - Initial Consult  Pharmacy Consult for warfarin Indication: factor V Leiden mutation with history of DVT and PE   Allergies  Allergen Reactions  . Niacin     REACTION: rash, itching, hot  . Crestor [Rosuvastatin Calcium]     Memory loss  . Codeine     nausea    Patient Measurements: Height: 5\' 8"  (172.7 cm) Weight: 175 lb 7.8 oz (79.6 kg) IBW/kg (Calculated) : 68.4 Heparin Dosing Weight:   Vital Signs: Temp: 97.5 F (36.4 C) (06/08 0200) Temp Source: Oral (06/08 0200) BP: 118/74 mmHg (06/08 0200) Pulse Rate: 54 (06/08 0200)  Labs:  Recent Labs  06/10/15 1356 06/11/15 1211 06/11/15 2315 06/12/15 0049 06/12/15 0344  HGB  --  15.4 14.7  --   --   HCT  --  42.5 40.4  --   --   PLT  --  172 169  --   --   LABPROT  --   --   --   --  19.7*  INR 1.3  --   --   --  1.67*  CREATININE  --  1.02 0.99  --   --   TROPONINI  --   --   --  <0.03  --     Estimated Creatinine Clearance: 80.6 mL/min (by C-G formula based on Cr of 0.99).   Medical History: Past Medical History  Diagnosis Date  . Hyperlipemia   . 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 Benign prostatic hypertrophy history  . Torn ACL 2008    right - no surgery due to FVL  . Factor 5 Leiden mutation, heterozygous ([place])   . Pulmonary embolism ([place]) [id number]  . DVT (deep venous thrombosis) ([place]) 2005  . Depression 2005  . GERD (gastroesophageal reflux disease)     Medications:  Prescriptions prior to admission  Medication Sig Dispense Refill Last Dose  . ezetimibe-simvastatin (VYTORIN) 10-40 MG tablet Take 1 tablet by mouth at bedtime. Muat keep new appw w/new PCP for refills 30 tablet 0 06/11/2015 at Unknown time  . folic acid (FOLVITE) Q000111Q MCG tablet Take 800 mcg by mouth daily.     06/11/2015 at Unknown time  . metFORMIN (GLUCOPHAGE) 500 MG tablet Take 1 tablet (500 mg total) by mouth 2 (two) times daily with a meal. 60 tablet 0 06/11/2015 at Unknown time  . Multiple Vitamin  (MULTIVITAMIN) tablet Take 1 tablet by mouth daily.     06/10/2015 at Unknown time  . Omega-3 Fatty Acids (FISH OIL) 1200 MG CAPS Take 1,200 mg by mouth daily.     06/11/2015 at Unknown time  . vitamin C (ASCORBIC ACID) 500 MG tablet Take 500 mg by mouth daily.     06/10/2015 at Unknown time  . warfarin (COUMADIN) 5 MG tablet Take as directed by [facility] Clinic (Patient taking differently: Take 10mg s today, take 5mg  Thursday, take 7.5mg s Friday. Then take 5mg  every day except Monday and Fridays take 7.5mg s) 120 tablet 1 06/11/2015 at 1800   Scheduled:  . ezetimibe-simvastatin  1 tablet Oral QHS  . folic acid  [id number] mcg Oral Daily  . insulin aspart  0-9 Units Subcutaneous Q4H  . metFORMIN  500 mg Oral BID WC  . multivitamin with minerals  1 tablet Oral Daily  . omega-3 acid ethyl esters  1 g Oral Daily  . vitamin C  500 mg Oral Daily  . warfarin  5 mg Oral NOW  . Warfarin - Pharmacist Dosing Inpatient  Does not apply q1800    Assessment: Patient with factor V Leiden mutation with history of DVT and PE.  INR this AM < 2.   Goal of Therapy:  INR 2-3    Plan:  Warfarin 5mg  po x1 now Daily INR  Tyler Deis, Shea Stakes [place] 06/12/2015,5:51 AM

## 2015-06-12 NOTE — Telephone Encounter (Signed)
Message left for patient to return my call and schedule appt.

## 2015-06-12 NOTE — Progress Notes (Signed)
PROGRESS NOTE    Evan Cabrera  [id number] DOB: December 16, 1960 DOA: 06/11/2015 PCP: Evan Bush, MD   Outpatient Specialists:    Brief Narrative:  55 y.o. 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 Patient reports being in his usual state of health until approximately 3 months ago when he began to develop polyuria and polydipsia. The symptoms progressively worsened over the past 3 months and have been accompanied by a 25 pound weight loss and recent development of blurred vision. Patient saw his eye doctor just 2 days ago and was diagnosed with cataracts. Yesterday, with ongoing polydipsia, polyuria, and general malaise, the patient thought to check his blood sugar as most of his family are diabetics. CBG was reportedly in the 400s and he called his PCP who advised ED evaluation. Blood sugar was in the mid 300s upon his arrival to the ED yesterday and came down to the 200s with IV fluids. Patient was discharged home in much improved and stable condition with new prescription for metformin along with instructions to follow-up with his PCP and for outpatient endocrinology consultation. Unfortunately, back at home, patient developed GI upset with nausea, nonbloody nonbilious vomiting, and diarrhea. He checked his blood sugar and found it again to be in the 400s and return to the emergency department.  Assessment & Plan:   Principal Problem:   Hyperglycemia Active Problems:   Factor 5 Leiden mutation, heterozygous ([place])   Diabetes mellitus, new onset ([facility])   Nausea and vomiting   Diarrhea  Assessment/Plan  1. New-onset diabetes with persistent hyperglycemia - Increase Metformin to 1000 MG po bid. Patient is not keen on insulin, but willing to use insulin if absolutely indicated. There is an option of adding Glipizide. Monitor blood sugar  closely. Discussed symptoms and signs of hypoglycemia with patient. Discussed need for yearly eye examination and flu vaccine and need for 5 yearly Pneumovax. Start baby aspirin. Low threshold to add ACEI if BP allows.  2. Nausea, vomiting, diarrhea - Slowly improving. Manage expectantly.  3. Factor V Leiden mutation with hx of VTE  - Managed with warfarin  - INR was sub-therapeutic. Pharmacy is assisting with coumadin management.  4. Hyperlipidemia  - Managed with Vytorin and fish oil, will continue    DVT prophylaxis: sq Lovenox  Code Status: Full  Family Communication: Evan Cabrera updated at bedside  Disposition Plan: Observe on med-surg Consults called: None  Subjective: No new complaints. Diarrhea is improving. Nausea and vomiting have almost resolved. Blood sugar control is improving. No fever or chills. No SOB.  Objective: Filed Vitals:   06/11/15 2301 06/12/15 0127 06/12/15 0200 06/12/15 0554  BP: 139/86 124/77 118/74 111/74  Pulse: 78 65 54 60  Temp: 98.1 F (36.7 C)  97.5 F (36.4 C) 98.2 F (36.8 C)  TempSrc: Oral  Oral Oral  Resp:  20 18 18   Height:   5\' 8"  (1.727 m)   Weight:   79.6 kg (175 lb 7.8 oz)   SpO2: 99% 98% 98% 99%    Intake/Output Summary (Last 24 hours) at 06/12/15 1342 Last data filed at 06/12/15 1300  Gross per 24 hour  Intake    480 ml  Output      3 ml  Net    477 ml   Filed Weights   06/12/15 0200  Weight: 79.6 kg (175 lb 7.8 oz)  Examination:  General exam: Appears calm and comfortable  Respiratory system: Clear to auscultation.   Cardiovascular system: S1 & S2 heard. Gastrointestinal system: Abdomen is nondistended, soft and nontender. No organomegaly or masses felt.   Central nervous system: Alert and oriented. No focal neurological deficits. Extremities: No leg edema.   Data Reviewed: I have personally reviewed following labs and imaging studies  CBC:  Recent Labs Lab 06/11/15 1211 06/11/15 2315  WBC 5.9 5.4  HGB  15.4 14.7  HCT 42.5 40.4  MCV 86.2 85.8  PLT 172 [id number]   Basic Metabolic Panel:  Recent Labs Lab 06/11/15 1211 06/11/15 2315  NA 138 136  K 4.0 3.9  CL 100* 105  CO2 26 21*  GLUCOSE 365* 359*  BUN 19 16  CREATININE 1.02 0.99  CALCIUM 9.3 8.7*   GFR: Estimated Creatinine Clearance: 80.6 mL/min (by C-G formula based on Cr of 0.99). Liver Function Tests:  Recent Labs Lab 06/11/15 1211  AST 17  ALT 28  ALKPHOS 67  BILITOT 1.0  PROT 7.5  ALBUMIN 4.8   No results for input(s): LIPASE, AMYLASE in the last 168 hours. No results for input(s): AMMONIA in the last 168 hours. Coagulation Profile:  Recent Labs Lab 06/10/15 1356 06/12/15 0344  INR 1.3 1.67*   Cardiac Enzymes:  Recent Labs Lab 06/12/15 0049  TROPONINI <0.03   BNP (last 3 results) No results for input(s): PROBNP in the last 8760 hours. HbA1C: No results for input(s): HGBA1C in the last 72 hours. CBG:  Recent Labs Lab 06/11/15 2259 06/12/15 0138 06/12/15 0431 06/12/15 0813 06/12/15 1155  GLUCAP 332* 370* 298* 206* 232*   Lipid Profile: No results for input(s): CHOL, HDL, LDLCALC, TRIG, CHOLHDL, LDLDIRECT in the last 72 hours. Thyroid Function Tests: No results for input(s): TSH, T4TOTAL, FREET4, T3FREE, THYROIDAB in the last 72 hours. Anemia Panel: No results for input(s): VITAMINB12, FOLATE, FERRITIN, TIBC, IRON, RETICCTPCT in the last 72 hours. Urine analysis:    Component Value Date/Time   COLORURINE YELLOW 06/12/2015 [place] 06/12/2015 0243   LABSPEC 1.034* 06/12/2015 0243   PHURINE 5.5 06/12/2015 0243   GLUCOSEU >1000* 06/12/2015 0243   HGBUR NEGATIVE 06/12/2015 0243   BILIRUBINUR NEGATIVE 06/12/2015 0243   KETONESUR >80* 06/12/2015 0243   PROTEINUR NEGATIVE 06/12/2015 0243   NITRITE NEGATIVE 06/12/2015 0243   LEUKOCYTESUR NEGATIVE 06/12/2015 0243   Sepsis Labs: @LABRCNTIP (procalcitonin:4,lacticidven:4)  )No results found for this or any previous visit  (from the past 240 hour(s)).   Radiology Studies: No results found.   Scheduled Meds: . ezetimibe-simvastatin  1 tablet Oral QHS  . folic acid  [id number] mcg Oral Daily  . insulin aspart  0-9 Units Subcutaneous Q4H  . living well with diabetes book   Does not apply Once  . metFORMIN  500 mg Oral BID WC  . multivitamin with minerals  1 tablet Oral Daily  . omega-3 acid ethyl esters  1 g Oral Daily  . vitamin C  500 mg Oral Daily  . warfarin  2.5 mg Oral NOW  . Warfarin - Pharmacist Dosing Inpatient   Does not apply q1800   Continuous Infusions:  Time spent: [place], MD [facility] Pager [phone]  If 7PM-7AM, please contact night-coverage [email or web address] Password [email or web address] 06/12/2015, 1:42 PM

## 2015-06-12 NOTE — Progress Notes (Signed)
Pharmacy: Re- warfarin  Patient is a 55 y.o M with hx DVT/PE and factor V leiden on warfarin PTA, admitted on 6/7 with hyperglycemia.  Outpatient [facility] clinic note on 6/6 indicated that his INR is 2.5-3.5 but pt INR was sub-therapeutic at 1.3 on 6/6 with instructions to take 10 mg on 6/6 and 6/7 and increase warfarin to 5 mg daily except 7.5 mg on Mon and Fri.  Admit INR was 1.67 on 6/8  Plan: - will give an additional  warfarin dose of 2.5 mg (to get 7.5 mg total today) - If appropriate, consider bridging patient with lovenox or UFH until INR is therapeutic  Dia Sitter, PharmD, BCPS 06/12/2015 9:19 AM

## 2015-06-12 NOTE — ED Notes (Signed)
EKG given to EDP, Horton,MD., for review. 

## 2015-06-12 NOTE — H&P (Signed)
History and Physical    Evan Cabrera [id number] DOB: Jul 18, 1960 DOA: 06/11/2015  PCP: Evan Bush, MD   Patient coming from: Home   Chief Complaint: Nausea, vomiting, diarrhea, hyperglycemia   HPI: Evan Cabrera is a 55 y.o. 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 Patient reports being in his usual state of health until approximately 3 months ago when he began to develop polyuria and polydipsia. The symptoms progressively worsened over the past 3 months and have been accompanied by a 25 pound weight loss and recent development of blurred vision. Patient saw his eye doctor just 2 days ago and was diagnosed with cataracts. Yesterday, with ongoing polydipsia, polyuria, and general malaise, the patient thought to check his blood sugar as most of his family are diabetics. CBG was reportedly in the 400s and he called his PCP who advised ED evaluation. Blood sugar was in the mid 300s upon his arrival to the ED yesterday and came down to the 200s with IV fluids. Patient was discharged home in much improved and stable condition with new prescription for metformin along with instructions to follow-up with his PCP and for outpatient endocrinology consultation. Unfortunately, back at home, patient developed GI upset with nausea, nonbloody nonbilious vomiting, and diarrhea. He checked his blood sugar and found it again to be in the 400s and return to the emergency department.  ED Course: Upon arrival to the ED, patient is found to be afebrile, saturating well on room air, and with vital signs stable. EKG demonstrates a sinus rhythm with early R-wave progression and troponin is undetectable. Chemistry panel features a mild depression and serum bicarbonate to 21 but is otherwise unremarkable. CBC is within the normal limits. Patient continued to have  nausea, vomiting, and diarrhea in the ED and was unable to tolerate PO intake. Zofran was administered and a 1 L normal saline bolus was given. Given the patient's persistent hyperglycemia, as well as nausea with vomiting that precludes his ability to tolerate his metformin, he will be observed in the hospital for ongoing evaluation and management of new onset diabetes with persistent hyperglycemia and nausea, vomiting, and diarrhea.  Review of Systems:  All other systems reviewed and apart from HPI, are negative.  Past Medical History  Diagnosis Date  . Hyperlipemia   . 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 Benign prostatic hypertrophy history  . Torn ACL 2008    right - no surgery due to FVL  . Factor 5 Leiden mutation, heterozygous ([place])   . Pulmonary embolism ([place]) [id number]  . DVT (deep venous thrombosis) ([place]) 2005  . Depression 2005  . GERD (gastroesophageal reflux disease)     Past Surgical History  Procedure Laterality Date  . Vasectomy  1988  . Knee surgery Right 2003    for cartilage  . Wisdom tooth extraction    . Colonoscopy  10/2011    Dr Evan Cabrera  . Colonoscopy  12/2014    rpt 5 yrs Evan Cabrera)     reports that he has never smoked. He has never used smokeless tobacco. He reports that he does not drink alcohol or use illicit drugs.  Allergies  Allergen Reactions  . Niacin     REACTION: rash, itching, hot  . Crestor [Rosuvastatin Calcium]     Memory loss  . Codeine     nausea    Family History  Problem Relation Age of Onset  . Heart attack Father 40    CBAG X1  . Deep vein thrombosis Father   . Diabetes Father   . Uterine cancer Mother   . Hypertension Mother   . Hyperlipidemia Mother   . Heart disease Mother     3 vessel CBAG @ 45  . Diabetes Brother   . Heart attack Brother 75    Diabetic  . Pulmonary embolism Maternal Grandfather   . Stroke Neg Hx      Prior to Admission medications   Medication Sig Start Date End Date Taking? Authorizing Provider    ezetimibe-simvastatin (VYTORIN) 10-40 MG tablet Take 1 tablet by mouth at bedtime. Muat keep new appw w/new PCP for refills 05/01/15  Yes Evan Lima, MD  folic acid (FOLVITE) Q000111Q MCG tablet Take 800 mcg by mouth daily.     Yes Historical Provider, MD  metFORMIN (GLUCOPHAGE) 500 MG tablet Take 1 tablet (500 mg total) by mouth 2 (two) times daily with a meal. 06/11/15  Yes Evan Schmidt, MD  Multiple Vitamin (MULTIVITAMIN) tablet Take 1 tablet by mouth daily.     Yes Historical Provider, MD  Omega-3 Fatty Acids (FISH OIL) 1200 MG CAPS Take 1,200 mg by mouth daily.     Yes Historical Provider, MD  vitamin C (ASCORBIC ACID) 500 MG tablet Take 500 mg by mouth daily.     Yes Historical Provider, MD  warfarin (COUMADIN) 5 MG tablet Take as directed by Coumadin Clinic Patient taking differently: Take 10mg s today, take 5mg  Thursday, take 7.5mg s Friday. Then take 5mg  every day except Monday and Fridays take 7.5mg s 06/10/15  Yes Evan Lance, MD    Physical Exam: Filed Vitals:   06/11/15 2301 06/12/15 0127  BP: 139/86 124/77  Pulse: 78 65  Temp: 98.1 F (36.7 C)   TempSrc: Oral   Resp:  20  SpO2: 99% 98%      Constitutional: NAD, calm, in apparent discomfort Eyes: PERTLA, lids and conjunctivae normal ENMT: Mucous membranes are dry. Posterior pharynx clear of any exudate or lesions.   Neck: normal, supple, no masses, no thyromegaly Respiratory: clear to auscultation bilaterally, no wheezing, no crackles. Normal respiratory effort. No accessory muscle use.  Cardiovascular: S1 & S2 heard, regular rate and rhythm, no significant murmurs / rubs / gallops. No extremity edema. 2+ pedal pulses.   Abdomen: No distension, no tenderness, no masses palpated. Bowel sounds normal.  Musculoskeletal: no clubbing / cyanosis. No joint deformity upper and lower extremities. Normal muscle tone.  Skin: no significant rashes, lesions, ulcers. Warm, dry, well-perfused. Neurologic: CN 2-12 grossly intact. Sensation  intact, DTR normal. Strength 5/5 in all 4 limbs.  Psychiatric: Normal judgment and insight. Alert and oriented x 3. Normal mood and affect.     Labs on Admission: I have personally reviewed following labs and imaging studies  CBC:  Recent Labs Lab 06/11/15 1211 06/11/15 2315  WBC 5.9 5.4  HGB 15.4 14.7  HCT 42.5 40.4  MCV 86.2 85.8  PLT 172 [id number]   Basic Metabolic Panel:  Recent Labs Lab 06/11/15 1211 06/11/15 2315  NA 138 136  K 4.0 3.9  CL 100* 105  CO2 26 21*  GLUCOSE 365* 359*  BUN 19 16  CREATININE 1.02 0.99  CALCIUM 9.3 8.7*   GFR: CrCl cannot be calculated (Unknown ideal weight.). Liver Function Tests:  Recent Labs Lab 06/11/15 1211  AST 17  ALT 28  ALKPHOS 67  BILITOT 1.0  PROT 7.5  ALBUMIN 4.8   No results for input(s): LIPASE, AMYLASE in the last 168 hours. No results for input(s): AMMONIA in the last 168 hours. Coagulation Profile:  Recent Labs Lab 06/10/15 1356  INR 1.3   Cardiac Enzymes:  Recent Labs Lab 06/12/15 0049  TROPONINI <0.03   BNP (last 3 results) No results for input(s): PROBNP in the last 8760 hours. HbA1C: No results for input(s): HGBA1C in the last 72 hours. CBG:  Recent Labs Lab 06/11/15 1159 06/11/15 1422 06/11/15 2259  GLUCAP 330* 285* 332*   Lipid Profile: No results for input(s): CHOL, HDL, LDLCALC, TRIG, CHOLHDL, LDLDIRECT in the last 72 hours. Thyroid Function Tests: No results for input(s): TSH, T4TOTAL, FREET4, T3FREE, THYROIDAB in the last 72 hours. Anemia Panel: No results for input(s): VITAMINB12, FOLATE, FERRITIN, TIBC, IRON, RETICCTPCT in the last 72 hours. Urine analysis: No results found for: COLORURINE, APPEARANCEUR, LABSPEC, PHURINE, GLUCOSEU, HGBUR, BILIRUBINUR, KETONESUR, PROTEINUR, UROBILINOGEN, NITRITE, LEUKOCYTESUR Sepsis Labs: @LABRCNTIP (procalcitonin:4,lacticidven:4) )No results found for this or any previous visit (from the past 240 hour(s)).   Radiological Exams on  Admission: No results found.  EKG: Independently reviewed. Sinus rhythm, early R-wave progression.   Assessment/Plan  1. New-onset diabetes with persistent hyperglycemia  - 3 mos of polyuria and polydipsia with ~25 lb wt-loss and blurred vision  - Prescribed metformin 500 BID last night and has taken only taken 2 doses so far   - A1c ordered and pending  - Pt reports strong FHx of type II DM  - Suspect this is type II, will check antibodies  - Pt is fairly adamant about not wanting to use insulin at home unless "absolutely necessary"  - Will continue IVF, check CBG q4h, cover with low-intensity slidinig-scale insulin for now, adjust prn    2. Nausea, vomiting, diarrhea - Secondary to hyperglycemia and/or metformin  - Replacing fluid-losses with IVF  - Antiemetics prn  - Advance diet as tolerated  - Repeat chem panel in am    3. Factor V Leiden mutation with hx of VTE  - Managed with warfarin  - INR was subtherapeutic at 1.3 on 06/10/15 and he took 10 mg warfarin 06/11/15 - Check INR in am; pharmacy asked to assist with dosing    4. Hyperlipidemia  - Managed with Vytorin and fish oil, will continue     DVT prophylaxis: sq Lovenox  Code Status: Full  Family Communication: Wife updated at bedside   Disposition Plan: Observe on med-surg Consults called: None Admission status: Observation     Vianne Bulls, MD [facility] Pager [phone]  If 7PM-7AM, please contact night-coverage [email or web address] Password [email or web address]  06/12/2015, 1:34 AM

## 2015-06-13 DIAGNOSIS — D6851 Activated protein C resistance: Secondary | ICD-10-CM

## 2015-06-13 DIAGNOSIS — E119 Type 2 diabetes mellitus without complications: Secondary | ICD-10-CM

## 2015-06-13 DIAGNOSIS — R112 Nausea with vomiting, unspecified: Secondary | ICD-10-CM

## 2015-06-13 DIAGNOSIS — R197 Diarrhea, unspecified: Secondary | ICD-10-CM | POA: Diagnosis not present

## 2015-06-13 DIAGNOSIS — R739 Hyperglycemia, unspecified: Secondary | ICD-10-CM

## 2015-06-13 LAB — GLUCOSE, CAPILLARY
GLUCOSE-CAPILLARY: 149 mg/dL — AB (ref 65–99)
GLUCOSE-CAPILLARY: 171 mg/dL — AB (ref 65–99)
GLUCOSE-CAPILLARY: 192 mg/dL — AB (ref 65–99)
GLUCOSE-CAPILLARY: 254 mg/dL — AB (ref 65–99)

## 2015-06-13 LAB — PROTIME-INR
INR: 1.99 — ABNORMAL HIGH (ref 0.00–1.49)
Prothrombin Time: 22.5 seconds — ABNORMAL HIGH (ref 11.6–15.2)

## 2015-06-13 LAB — HEMOGLOBIN A1C
Hgb A1c MFr Bld: 15 % — ABNORMAL HIGH (ref 4.8–5.6)
Hgb A1c MFr Bld: 13.9 % — ABNORMAL HIGH (ref 4.8–5.6)
Mean Plasma Glucose: 384 mg/dL
Mean Plasma Glucose: 352 mg/dL

## 2015-06-13 MED ORDER — GLIPIZIDE 5 MG PO TABS: 5.0000 mg | ORAL_TABLET | Freq: Two times a day (BID) | ORAL | Status: DC

## 2015-06-13 MED ORDER — INSULIN STARTER KIT- PEN NEEDLES (ENGLISH)
1.0000 | Freq: Once | Status: AC
  Administered 2015-06-13: 1
  Filled 2015-06-13: qty 1

## 2015-06-13 MED ORDER — WARFARIN SODIUM 5 MG PO TABS: 7.5000 mg | ORAL_TABLET | Freq: Once | ORAL | Status: DC

## 2015-06-13 MED ORDER — METFORMIN HCL 1000 MG PO TABS: 1000.0000 mg | ORAL_TABLET | Freq: Two times a day (BID) | ORAL | Status: DC

## 2015-06-13 MED ORDER — INSULIN PEN NEEDLE 31G X 5 MM MISC: 12.0000 [IU] | Freq: Every day | Status: DC

## 2015-06-13 NOTE — Progress Notes (Signed)
Inpatient Diabetes Program Recommendations  AACE/ADA: New Consensus Statement on Inpatient Glycemic Control (2015)  Target Ranges:  Prepandial:   less than 140 mg/dL      Peak postprandial:   less than 180 mg/dL (1-2 hours)      Critically ill patients:  140 - 180 mg/dL   Lab Results  Component Value Date   GLUCAP 254* 06/13/2015   HGBA1C 13.9* 06/12/2015    Review of Glycemic Control  Reviewed insulin pen administration. Pt was able to return demonstration. MD to discharge on glipizide 5 mg bid, metformin 1000 mg bid, Lantus 12 units QHS. Has f/u appt with PCP on June 21st. Educated on hypoglycemia s/s and treatment. OP Diabetes Education consult for newly-diagnosed DM.  Pt appears very motivated to make lifestyle changes to improve glycemic control.   Thank you. Lorenda Peck, RD, LDN, CDE Inpatient Diabetes Coordinator [phone]

## 2015-06-13 NOTE — Discharge Summary (Signed)
Physician Discharge Summary  Patient ID: Evan Cabrera MRN: [id number] DOB/AGE: 08/13/1960 55 y.o.  Admit date: 06/11/2015 Discharge date: 06/13/2015  Admission Diagnoses:  Discharge Diagnoses:  Principal Problem:   Hyperglycemia Active Problems:   Factor 5 Leiden mutation, heterozygous ([place])   Diabetes mellitus, new onset ([place])   Nausea and vomiting   Diarrhea   Discharged Condition: stable  Hospital Course: 55 y.o. 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 Patient reported being in his usual state of health until approximately 3 months prior to presentation when he began to develop polyuria and polydipsia. The symptoms progressively worsened over the last 3 months and have been accompanied by a 25 pound weight loss and recent development of blurred vision. Patient saw the Ophthalmologist 2 days prior to presentation and was diagnosed with cataracts. Patient continued to have polydipsia, polyuria, and general malaise a day prior to admission and the patient decided to check his blood sugar as most of his family were diabetics and it was found to be in the 400's. Patient was advised by his PCP to come to the ED for evaluation. Blood sugar was in the mid 300s upon arrival to the ED, but  came down to the 200s with IV fluids and patient was discharged home in much improved and stable condition with new prescription for metformin along with instructions to follow-up with his PCP and for outpatient endocrinology consultation. Unfortunately, back at home, patient developed GI upset (nausea, nonbloody nonbilious vomiting, and diarrhea0. Patient rechecked his blood sugar on the day of presentation and it was found again to be in the 400's and patient returned to the emergency department. Patient was admitted for further assessment and management.  Gastroenteritis was managed supportively and has resolved. Patient's Metformin was increased to 1000 MG po Bid, and patient was covered with sliding scale insulin. HbAic was 13.9. Patient's blood sugar control has improved significantly. Patient will be discharged back home on [place] Lantus 12 units every night, Glipizide 5mg  po Bid and Metformin 1000mg  po Bid. Patient was seen by the Diabetes educator during the hospital stay. Signs and symptoms of hypoglycemia, and immediate remedy were discussed extensively with the patient. Patient understands the need to check his blood sugar at least 4 times daily, document the readings, and need to discuss the records with the PCP so the Diabetic regimen will be adjusted accordingly. Patient is eager to be discharged back home today.  Consults: Diabetic educator.  Significant Diagnostic Studies: HbA1c was 13.9.  Discharge medication - Please see Med. Rec.  Discharge Exam: Blood pressure 123/73, pulse 63, temperature 98 F (36.7 C), temperature source Oral, resp. rate 16, height 5\' 8"  (1.727 m), weight 79.6 kg (175 lb 7.8 oz), SpO2 100 %.  Disposition: 01-Home or Self Care  Discharge Instructions    Ambulatory referral to Nutrition and Diabetic Education    Complete by:  As directed      Call MD for:    Complete by:  As directed   For signs of low blood sugar or significantly uncontrolled blood suar     Diet Carb Modified    Complete by:  As directed      Discharge instructions    Complete by:  As directed   Check blood sugar 4 times daily and document readings, and review with your PCP. Call PCP for blood sugar consistently  greater than 250 or less than 90.     Increase activity slowly    Complete by:  As directed             Medication List    TAKE these medications        ezetimibe-simvastatin 10-40 MG tablet  Commonly known as:  VYTORIN  Take 1 tablet by mouth at bedtime. Muat keep new appw w/new PCP for refills     Fish Oil 1200 MG Caps   Take 1,200 mg by mouth daily.     folic acid Q000111Q MCG tablet  Commonly known as:  FOLVITE  Take 800 mcg by mouth daily.     glipiZIDE 5 MG tablet  Commonly known as:  GLUCOTROL  Take 1 tablet (5 mg total) by mouth 2 (two) times daily before a meal.     Insulin Pen Needle 31G X 5 MM Misc  12 Units by Does not apply route at bedtime.     metFORMIN 1000 MG tablet  Commonly known as:  GLUCOPHAGE  Take 1 tablet (1,000 mg total) by mouth 2 (two) times daily with a meal.     multivitamin tablet  Take 1 tablet by mouth daily.     vitamin C 500 MG tablet  Commonly known as:  ASCORBIC ACID  Take 500 mg by mouth daily.     warfarin 5 MG tablet  Commonly known as:  COUMADIN  Take as directed by Coumadin Clinic           Follow-up Information    Follow up with [name], MD In 5 days.   Specialty:  Family Medicine   Contact information:   [address] [place] [address] [phone]       Signed: Bonnell Public 06/13/2015, 3:45 PM

## 2015-06-13 NOTE — Progress Notes (Signed)
Discharge instructions reviewed with patient. Patient verbalized understanding. 

## 2015-06-13 NOTE — Progress Notes (Signed)
[place] for warfarin Indication: factor V Leiden mutation with history of DVT and PE  Patient is a 55 y.o M with hx DVT/PE and factor V leiden on warfarin PTA.  Outpatient [facility] clinic note on 6/6 indicated that his INR goal is 2.5-3.5 but pt INR was sub-therapeutic on admission at 1.67  New PTA warfarin regimen started OP 06/10/15 in response to INR of 1.3 at office appt. New regimen: 5 mg daily except 7.5 mg on Mon and Fri.  Today's INR 1.99, no bleeding noted  Plan: - Warfarin 7.5mg  x1 dose today - If appropriate, consider bridging patient with lovenox or UFH until INR is therapeutic   Darl Pikes, PharmD Clinical Pharmacist- Resident Pager: [phone]  06/13/2015 8:10 AM

## 2015-06-16 ENCOUNTER — Telehealth: Payer: Self-pay

## 2015-06-16 LAB — INSULIN ANTIBODIES, BLOOD: Insulin Antibodies, Human: <5 uU/mL

## 2015-06-16 NOTE — Telephone Encounter (Signed)
1st attempt to complete TCM.   Transition Care Management Follow-up Telephone Call     Date discharged? 06/23/2015        How have you been since you were released from the hospital? Adjusting to diabetic management regimen   Any patient concerns? None    Do you understand why you were in the hospital? Yes   Do you understand the discharge instructions? Yes   Where were you discharged to? Home   Items Reviewed:  Medications reviewed: Yes  Allergies reviewed: Yes  Dietary changes reviewed: Yes    Referrals reviewed: Yes   Functional Questionnaire:  Independent - I Dependent - D    Activities of Daily Living (ADLs):  Independent with all ADLs and iADLs  Personal hygiene  Dressing Eating  Maintaining continence Transferring   Independent Activities of Daily Living (ADLs): Basic communication skills  Transportation  Meal preparation    Shopping  Housework  Managing medications  Managing personal finances    Confirmed importance and date/time of follow-up visits scheduled YES  Provider Appointment booked with PCP 06/18/2015 @ 1000  Confirmed with patient if condition begins to worsen call PCP or go to the ER.  Patient was given the office number and encouraged to call back with question or concerns: YES

## 2015-06-16 NOTE — Telephone Encounter (Signed)
Appt scheduled

## 2015-06-17 ENCOUNTER — Ambulatory Visit (INDEPENDENT_AMBULATORY_CARE_PROVIDER_SITE_OTHER): Payer: Managed Care, Other (non HMO) | Admitting: Pharmacist

## 2015-06-17 DIAGNOSIS — D682 Hereditary deficiency of other clotting factors: Secondary | ICD-10-CM

## 2015-06-17 DIAGNOSIS — Z7901 Long term (current) use of anticoagulants: Secondary | ICD-10-CM | POA: Diagnosis not present

## 2015-06-17 DIAGNOSIS — I2699 Other pulmonary embolism without acute cor pulmonale: Secondary | ICD-10-CM | POA: Diagnosis not present

## 2015-06-17 DIAGNOSIS — I82409 Acute embolism and thrombosis of unspecified deep veins of unspecified lower extremity: Secondary | ICD-10-CM

## 2015-06-17 DIAGNOSIS — D6851 Activated protein C resistance: Secondary | ICD-10-CM | POA: Diagnosis not present

## 2015-06-17 LAB — POCT INR: INR: 1.5

## 2015-06-18 ENCOUNTER — Encounter: Payer: Self-pay | Admitting: Family Medicine

## 2015-06-18 ENCOUNTER — Ambulatory Visit (INDEPENDENT_AMBULATORY_CARE_PROVIDER_SITE_OTHER): Payer: Managed Care, Other (non HMO) | Admitting: Family Medicine

## 2015-06-18 ENCOUNTER — Other Ambulatory Visit: Payer: Self-pay | Admitting: *Deleted

## 2015-06-18 VITALS — BP 110/64 | HR 64 | Temp 98.0°F | Wt 164.0 lb

## 2015-06-18 DIAGNOSIS — D6851 Activated protein C resistance: Secondary | ICD-10-CM | POA: Diagnosis not present

## 2015-06-18 DIAGNOSIS — E119 Type 2 diabetes mellitus without complications: Secondary | ICD-10-CM | POA: Diagnosis not present

## 2015-06-18 DIAGNOSIS — E785 Hyperlipidemia, unspecified: Secondary | ICD-10-CM | POA: Diagnosis not present

## 2015-06-18 DIAGNOSIS — R112 Nausea with vomiting, unspecified: Secondary | ICD-10-CM

## 2015-06-18 MED ORDER — GLUCOSE BLOOD VI STRP: 1.0000 | ORAL_STRIP | Freq: Four times a day (QID) | Status: DC

## 2015-06-18 NOTE — Assessment & Plan Note (Signed)
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  Currently followed at cardiology clinic.

## 2015-06-18 NOTE — Patient Instructions (Addendum)
Let us know brand of glucose meter to send strips in. Continue checking four times daily for now - and drop off log in 2 wks to review. Return in 1 month for diabetes follow up - let us know sooner if any questions or concerns.  Before your next appointment come in for fasting labs.  Good to see you today, call us with questions. Return as needed or in 1 month for follow up visit.

## 2015-06-18 NOTE — Assessment & Plan Note (Signed)
Hospitalization records reviewed.  Discussed management of diabetes. Handout provided. Refer to diabetes education at [facility]. Continue current regimen of lantus, metformin, glipizide.  Return 1 mo for DM f/u visit, prior for fructosamine/FLP.

## 2015-06-18 NOTE — Progress Notes (Signed)
BP 110/64 mmHg  Pulse 64  Temp(Src) 98 F (36.7 C) (Oral)  Wt 164 lb (74.39 kg)   CC: hospitalization f/u visit  Subjective:    Patient ID: Evan Cabrera, male    DOB: Aug 06, 1960, 55 y.o.   MRN: [id number]  HPI: Evan Cabrera is a 55 y.o. male presenting on 06/18/2015 for Follow-up   Recent hospitalization for hyperglycemia with nausea/vomiting. ER note and then hospitalization records reviewed. 3 mo h/o polyuria, polydipsia, weight loss, blurry vision. Metformin 1000mg  bid and glipizide 5mg  bid with meals started as well as lantus 12u nightly. Seen by diabetes education.  Lab Results  Component Value Date   HGBA1C 13.9* 06/12/2015   He has been checking sugars QID before meals ranging 113-240. Tolerating medications ok, mild GI upset with metformin. Overall healthy diet - avoids sugars as well as sweetened beverages. +fmhx DM. Increased stress over last 3 years.   Diabetic Foot Exam - Simple   Simple Foot Form  Diabetic Foot exam was performed with the following findings:  Yes 06/18/2015 10:48 AM  Visual Inspection  No deformities, no ulcerations, no other skin breakdown bilaterally:  Yes  Sensation Testing  Intact to touch and monofilament testing bilaterally:  Yes  Pulse Check  Posterior Tibialis and Dorsalis pulse intact bilaterally:  Yes  Comments      Has decided to join gym. Working on making weight room at home.   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  Lab Results  Component Value Date   INR 1.5 06/17/2015   INR 1.99* 06/13/2015   INR 1.67* 06/12/2015     Admit date: 06/11/2015 Discharge date: 06/13/2015 [facility] f/u phone call completed 06/16/2015  Admission Diagnoses: Discharge Diagnoses:  Principal Problem:  Hyperglycemia Active Problems:  Factor 5 Leiden mutation, heterozygous ([place])  Diabetes mellitus, new onset ([place])  Nausea and vomiting  Diarrhea  Discharged Condition:  stable  Relevant past medical, surgical, family and social history reviewed and updated as indicated. Interim medical history since our last visit reviewed. Allergies and medications reviewed and updated. Current Outpatient Prescriptions on File Prior to Visit  Medication Sig  . ezetimibe-simvastatin (VYTORIN) 10-40 MG tablet Take 1 tablet by mouth at bedtime. Muat keep new appw w/new PCP for refills  . folic acid (FOLVITE) Q000111Q MCG tablet Take 800 mcg by mouth daily.    Marland Kitchen glipiZIDE (GLUCOTROL) 5 MG tablet Take 1 tablet (5 mg total) by mouth 2 (two) times daily before a meal.  . Insulin Pen Needle 31G X 5 MM MISC 12 Units by Does not apply route at bedtime.  . metFORMIN (GLUCOPHAGE) 1000 MG tablet Take 1 tablet (1,000 mg total) by mouth 2 (two) times daily with a meal.  . Multiple Vitamin (MULTIVITAMIN) tablet Take 1 tablet by mouth daily.    . Omega-3 Fatty Acids (FISH OIL) 1200 MG CAPS Take 1,200 mg by mouth daily.    . vitamin C (ASCORBIC ACID) 500 MG tablet Take 500 mg by mouth daily.    Marland Kitchen warfarin (COUMADIN) 5 MG tablet Take as directed by Coumadin Clinic (Patient taking differently: Take 10mg s today, take 5mg  Thursday, take 7.5mg s Friday. Then take 5mg  every day except Monday and Fridays take 7.5mg s)   No current facility-administered medications on file prior to visit.    Review of Systems Per HPI unless specifically indicated in ROS section     Objective:    BP 110/64 mmHg  Pulse 64  Temp(Src) 98 F (36.7 C) (Oral)  Wt 164 lb (74.39 kg)  Wt Readings from Last 3 Encounters:  06/18/15 164 lb (74.39 kg)  06/12/15 175 lb 7.8 oz (79.6 kg)  05/21/15 169 lb 8 oz (76.885 kg)    Physical Exam  Constitutional: He appears well-developed and well-nourished. No distress.  HENT:  Head: Normocephalic and atraumatic.  Right Ear: External ear normal.  Left Ear: External ear normal.  Nose: Nose normal.  Mouth/Throat: Oropharynx is clear and moist. No oropharyngeal exudate.  Eyes:  Conjunctivae and EOM are normal. Pupils are equal, round, and reactive to light. No scleral icterus.  Neck: Normal range of motion. Neck supple.  Cardiovascular: Normal rate, regular rhythm, normal heart sounds and intact distal pulses.   No murmur heard. Pulmonary/Chest: Effort normal and breath sounds normal. No respiratory distress. He has no wheezes. He has no rales.  Musculoskeletal: He exhibits no edema.  See HPI for foot exam if done  Lymphadenopathy:    He has no cervical adenopathy.  Skin: Skin is warm and dry. No rash noted.  Psychiatric: He has a normal mood and affect.  Nursing note and vitals reviewed.      Assessment & Plan:   Problem List Items Addressed This Visit    HLD (hyperlipidemia)    Anticipate poor control based on recent diabetes dx. Recheck at 1 mo fasting lab visit.       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  Currently followed at cardiology clinic.       Diabetes mellitus, new onset ([place]) - Primary    Hospitalization records reviewed.  Discussed management of diabetes. Handout provided. Refer to diabetes education at [place]. Continue current regimen of lantus, metformin, glipizide.  Return 1 mo for DM f/u visit, prior for fructosamine/FLP.       Relevant Orders   Ambulatory referral to diabetic education   RESOLVED: Nausea and vomiting    Resolved. ?metformin related - now better tolerated.          Follow up plan: Return in about 4 weeks (around 07/16/2015), or as needed, for follow up visit.  [name], MD

## 2015-06-18 NOTE — Progress Notes (Signed)
Pre visit review using our clinic review tool, if applicable. No additional management support is needed unless otherwise documented below in the visit note. 

## 2015-06-18 NOTE — Assessment & Plan Note (Signed)
Anticipate poor control based on recent diabetes dx. Recheck at 1 mo fasting lab visit.

## 2015-06-18 NOTE — Assessment & Plan Note (Signed)
Resolved. ?metformin related - now better tolerated.

## 2015-06-20 LAB — ANTI-ISLET CELL ANTIBODY: Pancreatic Islet Cell Antibody: NEGATIVE

## 2015-06-23 LAB — GLUTAMIC ACID DECARBOXYLASE AUTO ABS

## 2015-06-25 ENCOUNTER — Encounter: Payer: Self-pay | Admitting: Family Medicine

## 2015-06-25 ENCOUNTER — Emergency Department (HOSPITAL_COMMUNITY): Payer: Managed Care, Other (non HMO)

## 2015-06-25 ENCOUNTER — Telehealth: Payer: Self-pay | Admitting: Family Medicine

## 2015-06-25 ENCOUNTER — Encounter (HOSPITAL_COMMUNITY): Payer: Self-pay | Admitting: Emergency Medicine

## 2015-06-25 ENCOUNTER — Emergency Department (HOSPITAL_COMMUNITY)
Admission: EM | Admit: 2015-06-25 | Discharge: 2015-06-25 | Disposition: A | Discharge status: Home / Self Care | Payer: Managed Care, Other (non HMO) | Attending: Emergency Medicine | Admitting: Emergency Medicine

## 2015-06-25 DIAGNOSIS — E119 Type 2 diabetes mellitus without complications: Secondary | ICD-10-CM | POA: Diagnosis not present

## 2015-06-25 DIAGNOSIS — H538 Other visual disturbances: Secondary | ICD-10-CM | POA: Insufficient documentation

## 2015-06-25 DIAGNOSIS — Z79899 Other long term (current) drug therapy: Secondary | ICD-10-CM | POA: Diagnosis not present

## 2015-06-25 DIAGNOSIS — Z86718 Personal history of other venous thrombosis and embolism: Secondary | ICD-10-CM | POA: Diagnosis not present

## 2015-06-25 DIAGNOSIS — E785 Hyperlipidemia, unspecified: Secondary | ICD-10-CM | POA: Insufficient documentation

## 2015-06-25 DIAGNOSIS — Z794 Long term (current) use of insulin: Secondary | ICD-10-CM | POA: Diagnosis not present

## 2015-06-25 DIAGNOSIS — Z7984 Long term (current) use of oral hypoglycemic drugs: Secondary | ICD-10-CM | POA: Diagnosis not present

## 2015-06-25 DIAGNOSIS — Z7901 Long term (current) use of anticoagulants: Secondary | ICD-10-CM | POA: Diagnosis not present

## 2015-06-25 DIAGNOSIS — H547 Unspecified visual loss: Secondary | ICD-10-CM

## 2015-06-25 DIAGNOSIS — F329 Major depressive disorder, single episode, unspecified: Secondary | ICD-10-CM | POA: Diagnosis not present

## 2015-06-25 HISTORY — DX: Type 2 diabetes mellitus without complications: E11.9

## 2015-06-25 LAB — CBC WITH DIFFERENTIAL/PLATELET
Basophils Absolute: 0 10*3/uL (ref 0.0–0.1)
Basophils Relative: 0 %
EOS ABS: 0.1 10*3/uL (ref 0.0–0.7)
Eosinophils Relative: 2 %
HCT: 41.1 % (ref 39.0–52.0)
HEMOGLOBIN: 14.7 g/dL (ref 13.0–17.0)
LYMPHS ABS: 2.1 10*3/uL (ref 0.7–4.0)
LYMPHS PCT: 40 %
MCH: 31.6 pg (ref 26.0–34.0)
MCHC: 35.8 g/dL (ref 30.0–36.0)
MCV: 88.4 fL (ref 78.0–100.0)
MONOS PCT: 7 %
Monocytes Absolute: 0.4 10*3/uL (ref 0.1–1.0)
NEUTROS PCT: 51 %
Neutro Abs: 2.6 10*3/uL (ref 1.7–7.7)
Platelets: 192 10*3/uL (ref 150–400)
RBC: 4.65 MIL/uL (ref 4.22–5.81)
RDW: 13 % (ref 11.5–15.5)
WBC: 5.1 10*3/uL (ref 4.0–10.5)

## 2015-06-25 LAB — BASIC METABOLIC PANEL
Anion gap: 8 (ref 5–15)
BUN: 15 mg/dL (ref 6–20)
CHLORIDE: 105 mmol/L (ref 101–111)
CO2: 25 mmol/L (ref 22–32)
CREATININE: 0.86 mg/dL (ref 0.61–1.24)
Calcium: 9.3 mg/dL (ref 8.9–10.3)
GFR calc Af Amer: >60 mL/min (ref >60)
GFR calc non Af Amer: >60 mL/min (ref >60)
GLUCOSE: 144 mg/dL — AB (ref 65–99)
Potassium: 3.8 mmol/L (ref 3.5–5.1)
SODIUM: 138 mmol/L (ref 135–145)

## 2015-06-25 LAB — PROTIME-INR
INR: 1.88 — ABNORMAL HIGH (ref 0.00–1.49)
PROTHROMBIN TIME: 21.5 s — AB (ref 11.6–15.2)

## 2015-06-25 LAB — CBG MONITORING, ED: Glucose-Capillary: 186 mg/dL — ABNORMAL HIGH (ref 65–99)

## 2015-06-25 MED ORDER — LORAZEPAM 2 MG/ML IJ SOLN
1.0000 mg | Freq: Once | INTRAMUSCULAR | Status: AC
  Administered 2015-06-25: 1 mg via INTRAVENOUS
  Filled 2015-06-25: qty 1

## 2015-06-25 MED ORDER — GADOBENATE DIMEGLUMINE 529 MG/ML IV SOLN
15.0000 mL | Freq: Once | INTRAVENOUS | Status: AC | PRN
  Administered 2015-06-25: 15 mL via INTRAVENOUS

## 2015-06-25 NOTE — ED Notes (Signed)
PT DISCHARGED. INSTRUCTIONS GIVEN. AAOX4. PT IN NO APPARENT DISTRESS OR PAIN. THE OPPORTUNITY TO ASK QUESTIONS WAS PROVIDED. 

## 2015-06-25 NOTE — ED Notes (Signed)
Pt reports he was recently diagnosed with type 2 DM. Has had bilateral blurred vision for the past week with significant decrease in visual acuity since Sunday. Pt reports he is able to make out the eyes of staff members in triage, but no other features. CBG 115 this am. CBG will come up to 200 over night but will be in low 100s during the day for the past several days. CBG was in 400s upon diagnosis a few weeks ago.

## 2015-06-25 NOTE — Telephone Encounter (Signed)
Per chart review pt is at [facility] ED now. DR G out of office.

## 2015-06-25 NOTE — ED Notes (Signed)
Patient transported to MRI 

## 2015-06-25 NOTE — ED Provider Notes (Signed)
CSN: [id number]     Arrival date & time 06/25/15  1035 History   First MD Initiated Contact with Patient 06/25/15 1055     Chief Complaint  Patient presents with  . Blurred Vision     (Consider location/radiation/quality/duration/timing/severity/associated sxs/prior Treatment) HPI 55 year old male who presents with blurry vision. He has a history of factor V Leiden deficiency on Coumadin, HLD, and recently diagnosed DM-2 for which he was currently hospitalized for early this month. Has had mild blurry vision in setting of new onset DM symptoms, over the past 3 months. I was seen by his ophthalmologist 2 weeks ago, diagnosed with cataracts or mild, but was told that he otherwise did not have any concerning findings. Reports that since this weekend, he has had acute onset of severe blurry vision bilaterally. No eye pain, eye redness, headache, n/v, speech changes, diplopia, numbness, weakness or gait instability. Called his PCP today, who sent him to ED for evaluation.  Past Medical History  Diagnosis Date  . Hyperlipemia   . 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 Benign prostatic hypertrophy history  . Torn ACL 2008    right - no surgery due to FVL  . Factor 5 Leiden mutation, heterozygous ([place])   . Pulmonary embolism ([place]) [id number]  . DVT (deep venous thrombosis) ([place]) 2005  . Depression 2005  . GERD (gastroesophageal reflux disease)   . Type 2 diabetes mellitus [facility])    Past Surgical History  Procedure Laterality Date  . Vasectomy  1988  . Knee surgery Right 2003    for cartilage  . Wisdom tooth extraction    . Colonoscopy  10/2011    Dr Glennon Hamilton  . Colonoscopy  12/2014    rpt 5 yrs Glennon Hamilton)   Family History  Problem Relation Age of Onset  . Heart attack Father 29    CBAG X1  . Deep vein thrombosis Father   . Diabetes Father   . Uterine cancer Mother   . Hypertension Mother   . Hyperlipidemia Mother   . Heart disease Mother     3 vessel CBAG @ 62  . Diabetes Brother   . Heart  attack Brother 64    Diabetic  . Pulmonary embolism Maternal Grandfather   . Stroke Neg Hx    Social History  Substance Use Topics  . Smoking status: Never Smoker   . Smokeless tobacco: Never Used  . Alcohol Use: No    Review of Systems 10/14 systems reviewed and are negative other than those stated in the HPI    Allergies  Niacin; Crestor; and Codeine  Home Medications   Prior to Admission medications   Medication Sig Start Date End Date Taking? Authorizing Provider  acetaminophen (TYLENOL) 500 MG tablet Take 1,000 mg by mouth every 6 (six) hours as needed for mild pain, moderate pain, fever or headache.   Yes Historical Provider, MD  ezetimibe-simvastatin (VYTORIN) 10-40 MG tablet Take 1 tablet by mouth at bedtime. Muat keep new appw w/new PCP for refills 05/01/15  Yes Janith Lima, MD  folic acid (FOLVITE) Q000111Q MCG tablet Take 800 mcg by mouth daily.     Yes Historical Provider, MD  glipiZIDE (GLUCOTROL) 5 MG tablet Take 1 tablet (5 mg total) by mouth 2 (two) times daily before a meal. 06/13/15  Yes Bonnell Public, MD  glucose blood test strip 1 each by Other route 4 (four) times daily. Use to check sugar four times daily. Dx: E11.9 **One Touch Ultra** 06/18/15  Yes [name], MD  insulin glargine (LANTUS) 100 UNIT/ML injection Inject 12 Units into the skin at bedtime.   Yes Historical Provider, MD  Insulin Pen Needle 31G X 5 MM MISC 12 Units by Does not apply route at bedtime. 06/13/15  Yes Bonnell Public, MD  metFORMIN (GLUCOPHAGE) 1000 MG tablet Take 1 tablet (1,000 mg total) by mouth 2 (two) times daily with a meal. 06/13/15  Yes Bonnell Public, MD  Multiple Vitamin (MULTIVITAMIN) tablet Take 1 tablet by mouth daily.     Yes Historical Provider, MD  Omega-3 Fatty Acids (FISH OIL) 1200 MG CAPS Take 1,200 mg by mouth daily.     Yes Historical Provider, MD  vitamin C (ASCORBIC ACID) 500 MG tablet Take 500 mg by mouth daily.     Yes Historical Provider, MD  warfarin  (COUMADIN) 5 MG tablet Take as directed by Coumadin Clinic Patient taking differently: Take 5-7.5 mg by mouth See admin instructions. Take 5mg  every day except Monday, Wednesday, and Fridays take 7.5mg s 06/10/15  Yes Evans Lance, MD   BP 101/59 mmHg  Pulse 58  Temp(Src) 97.9 F (36.6 C) (Oral)  Resp 16  SpO2 95% Physical Exam Physical Exam  Nursing note and vitals reviewed. Constitutional: Well developed, well nourished, non-toxic, and in no acute distress Head: Normocephalic and atraumatic.  Mouth/Throat: Oropharynx is clear and moist.  Eye: Normal external exam of bilateral eyes. EOMI, PERRL. No eye redness or proptosis. No appreciable papilledema on fundoscopic exam, although limited. No evidence of CRVO or CRAO. Neck: Normal range of motion. Neck supple.  Cardiovascular: Normal rate and regular rhythm.   Pulmonary/Chest: Effort normal and breath sounds normal.  Abdominal: Soft. There is no tenderness. There is no rebound and no guarding.  Musculoskeletal: Normal range of motion.  Neurological:  Alert, oriented to person, place, time, and situation. Memory grossly in tact. Fluent speech. No dysarthria or aphasia.  Cranial nerves: VF are full.  Pupils are symmetric, and reactive to light. EOMI without nystagmus. No gaze deviation. Facial muscles symmetric with activation. Sensation to light touch over face in tact bilaterally. Hearing grossly in tact. Palate elevates symmetrically. Head turn and shoulder shrug are intact. Tongue midline.  Reflexes defered.  Muscle bulk and tone normal. No pronator drift. Moves all extremities symmetrically. Sensation to light touch is in tact throughout in bilateral upper and lower extremities. Coordination reveals no dysmetria with finger to nose. Gait is narrow-based and steady. Non-ataxic. Skin: Skin is warm and dry.  Psychiatric: Cooperative  ED Course  Procedures (including critical care time) Labs Review Labs Reviewed  BASIC METABOLIC  PANEL - Abnormal; Notable for the following:    Glucose, Bld 144 (*)    All other components within normal limits  PROTIME-INR - Abnormal; Notable for the following:    Prothrombin Time 21.5 (*)    INR 1.88 (*)    All other components within normal limits  CBG MONITORING, ED - Abnormal; Notable for the following:    Glucose-Capillary 186 (*)    All other components within normal limits  CBC WITH DIFFERENTIAL/PLATELET    Imaging Review Mr Kizzie Fantasia Contrast  06/25/2015  CLINICAL DATA:  55 year old male with 1 week of blurred vision and decreased visual acuity. Diabetes, recent hyperglycemia. Initial encounter. EXAM: MRI HEAD WITHOUT AND WITH CONTRAST TECHNIQUE: Multiplanar, multiecho pulse sequences of the brain and surrounding structures were obtained without and with intravenous contrast. CONTRAST:  44mL MULTIHANCE GADOBENATE DIMEGLUMINE 529 MG/ML IV SOLN  COMPARISON:  Head CT without contrast 07/26/2003. FINDINGS: Cerebral volume is within normal limits. No restricted diffusion to suggest acute infarction. No midline shift, mass effect, evidence of mass lesion, ventriculomegaly, extra-axial collection or acute intracranial hemorrhage. Cervicomedullary junction and pituitary are within normal limits. Major intracranial vascular flow voids are preserved. [place] Cables and white matter signal is within normal limits for age throughout the brain. No cortical encephalomalacia or chronic cerebral blood products. No abnormal enhancement identified. No dural thickening. Optic chiasm, optic nerves, and other orbits soft tissues appear normal. No signal abnormality identified along the optic radiations. Visible internal auditory structures appear normal. Mastoids and paranasal sinuses are clear. Negative scalp soft tissues. Normal visualized cervical spine. Normal bone marrow signal. IMPRESSION: Normal MRI appearance of the brain. No orbit abnormality or explanation for visual changes identified. Electronically Signed    By: Genevie Ann M.D.   On: 06/25/2015 13:24   I have personally reviewed and evaluated these images and lab results as part of my medical decision-making.   EKG Interpretation None      MDM   Final diagnoses:  Decreased visual acuity  Blurry vision    55 year old male with history of factor V Leiden deficiency and recent diabetes diagnosis who presents with sudden onset of worsening visual acuity. Vision 20/100 in bilateral eyes. He is otherwise neuro in tact. Fundoscopic exam overall unremarkable. Suspect initially poorly controlled DM (initial hgb A1c 13% few weeks ago) causing likely retinopathy. Did perform MRI which was negative. Discussed return to ophthalmologist for repeat exam and close PCP f/u. Strict return and follow-up instructions reviewed. He expressed understanding of all discharge instructions and felt comfortable with the plan of care.     Forde Dandy, MD 06/25/15 [phone]

## 2015-06-25 NOTE — Discharge Instructions (Signed)
Please follow-up closely with your eye doctor for repeat exam. Continue to follow-up closely with your primary care provider. Return for worsening symptoms, including confusion, severe headaches, new numbness/weakness/speech changes or any other symptoms concerning to you.    Blurred Vision Having blurred vision means that you cannot see things clearly. Your vision may seem fuzzy or out of focus. Blurred vision is a very common symptom of an eye or vision problem. Blurred vision is often a gradual blur that occurs in one eye or both eyes. There are many causes of blurred vision, including cataracts, macular degeneration, and diabetic retinopathy. Blurred vision can be diagnosed based on your symptoms and a physical exam. Tell your health care provider about any other health problems you have, any recent eye injury, and any prior surgeries. You may need to see a health care provider who specializes in eye problems (ophthalmologist). Your treatment depends on what is causing your blurred vision.  HOME CARE INSTRUCTIONS  Tell your health care provider about any changes in your blurred vision.  Do not drive or operate heavy machinery if your vision is blurry.  Keep all follow-up visits as directed by your health care provider. This is important. SEEK MEDICAL CARE IF:  Your symptoms get worse.  You have new symptoms.  You have trouble seeing at night.  You have trouble seeing up close or far away.  You have trouble noticing the difference between colors. SEEK IMMEDIATE MEDICAL CARE IF:  You have severe eye pain.  You have a severe headache.  You have flashing lights in your field of vision.  You have a sudden change in vision.  You have a sudden loss of vision.  You have vision change after an injury.  You notice drainage coming from your eyes.  You notice a rash around your eyes.   This information is not intended to replace advice given to you by your health care provider. Make  sure you discuss any questions you have with your health care provider.   Document Released: 12/24/2002 Document Revised: 05/07/2014 Document Reviewed: 11/14/2013 Elsevier Interactive Patient Education [employer].

## 2015-06-25 NOTE — Telephone Encounter (Signed)
PLEASE NOTE: All timestamps contained within this report are represented as [place] Standard Time. CONFIDENTIALTY NOTICE: This fax transmission is intended only for the addressee. It contains information that is legally privileged, confidential or otherwise protected from use or disclosure. If you are not the intended recipient, you are strictly prohibited from reviewing, disclosing, copying using or disseminating any of this information or taking any action in reliance on or regarding this information. If you have received this fax in error, please notify us immediately by telephone so that we can arrange for its return to [place]. Phone: [phone], Toll-Free: [phone], Fax: [phone] Page: 1 of 1 Call Id: [id number] [place] Patient Name: Evan Cabrera DOB: 07-13-1960 Initial Comment Father was recently DX w/ diabetes and having problems with eyesite Nurse Assessment Nurse: Dimas Chyle, RN, Dellis Filbert Date/Time (Eastern Time): 06/25/2015 9:50:35 AM Confirm and document reason for call. If symptomatic, describe symptoms. You must click the next button to save text entered. ---Father was recently DX w/ diabetes and having problems with vision. Having issues for last week. Most recent BS was under 200. Taking metformin 1000 mg bid, glipizide, and Lantus HS. Has the patient traveled out of the country within the last 30 days? ---No Does the patient have any new or worsening symptoms? ---Yes Will a triage be completed? ---Yes Related visit to physician within the last 2 weeks? ---Yes Does the PT have any chronic conditions? (i.e. diabetes, asthma, etc.) ---Yes List chronic conditions. ---Clotting disorder, Diabetes type 2 Is this a behavioral health or substance abuse call? ---No Guidelines Guideline Title Affirmed Question Affirmed Notes Vision Loss or Change [1] Blurred vision or visual changes  AND [2] present now AND [3] sudden onset or new (e.g., minutes, hours, days) (Exception: seeing floaters / black specks OR previously diagnosed migraine headaches with same symptoms) Final Disposition User Go to ED Now (or PCP triage) Dimas Chyle, RN, Dellis Filbert Referrals GO TO FACILITY UNDECIDED Disagree/Comply: Leta [facility]

## 2015-06-25 NOTE — Telephone Encounter (Signed)
Will await disposition in ER 

## 2015-06-27 ENCOUNTER — Ambulatory Visit (INDEPENDENT_AMBULATORY_CARE_PROVIDER_SITE_OTHER): Payer: Managed Care, Other (non HMO) | Admitting: *Deleted

## 2015-06-27 DIAGNOSIS — I82409 Acute embolism and thrombosis of unspecified deep veins of unspecified lower extremity: Secondary | ICD-10-CM | POA: Diagnosis not present

## 2015-06-27 DIAGNOSIS — I2699 Other pulmonary embolism without acute cor pulmonale: Secondary | ICD-10-CM

## 2015-06-27 DIAGNOSIS — D682 Hereditary deficiency of other clotting factors: Secondary | ICD-10-CM | POA: Diagnosis not present

## 2015-06-27 DIAGNOSIS — Z7901 Long term (current) use of anticoagulants: Secondary | ICD-10-CM

## 2015-06-27 DIAGNOSIS — D6851 Activated protein C resistance: Secondary | ICD-10-CM

## 2015-06-27 LAB — POCT INR: INR: 1.9

## 2015-06-29 NOTE — Addendum Note (Signed)
Addended by: [name] on: 06/29/2015 06:19 PM   Modules accepted: Orders

## 2015-07-11 ENCOUNTER — Ambulatory Visit (INDEPENDENT_AMBULATORY_CARE_PROVIDER_SITE_OTHER): Payer: Managed Care, Other (non HMO) | Admitting: *Deleted

## 2015-07-11 DIAGNOSIS — D6851 Activated protein C resistance: Secondary | ICD-10-CM

## 2015-07-11 DIAGNOSIS — I82409 Acute embolism and thrombosis of unspecified deep veins of unspecified lower extremity: Secondary | ICD-10-CM | POA: Diagnosis not present

## 2015-07-11 DIAGNOSIS — I2699 Other pulmonary embolism without acute cor pulmonale: Secondary | ICD-10-CM

## 2015-07-11 DIAGNOSIS — Z7901 Long term (current) use of anticoagulants: Secondary | ICD-10-CM

## 2015-07-11 DIAGNOSIS — D682 Hereditary deficiency of other clotting factors: Secondary | ICD-10-CM | POA: Diagnosis not present

## 2015-07-11 LAB — POCT INR: INR: 2.7

## 2015-07-16 ENCOUNTER — Other Ambulatory Visit: Payer: Self-pay | Admitting: Family Medicine

## 2015-07-16 ENCOUNTER — Telehealth: Payer: Self-pay | Admitting: Family Medicine

## 2015-07-16 ENCOUNTER — Other Ambulatory Visit (INDEPENDENT_AMBULATORY_CARE_PROVIDER_SITE_OTHER): Payer: Managed Care, Other (non HMO)

## 2015-07-16 DIAGNOSIS — E785 Hyperlipidemia, unspecified: Secondary | ICD-10-CM

## 2015-07-16 DIAGNOSIS — E119 Type 2 diabetes mellitus without complications: Secondary | ICD-10-CM

## 2015-07-16 LAB — LIPID PANEL
CHOLESTEROL: 131 mg/dL (ref 0–200)
HDL: 41 mg/dL (ref >39.00)
LDL Cholesterol: 63 mg/dL (ref 0–99)
NonHDL: 90.09
TRIGLYCERIDES: 134 mg/dL (ref 0.0–149.0)
Total CHOL/HDL Ratio: 3
VLDL: 26.8 mg/dL (ref 0.0–40.0)

## 2015-07-16 LAB — MAGNESIUM: Magnesium: 2.2 mg/dL (ref 1.5–2.5)

## 2015-07-16 NOTE — Telephone Encounter (Signed)
Patient is keeping steady blood count readings around 150 and 160 except around the mid mornings.  The count goes down to around 45 and he feels awful.  Patient needs to know what to do about this.

## 2015-07-17 ENCOUNTER — Ambulatory Visit: Payer: Managed Care, Other (non HMO) | Admitting: Family Medicine

## 2015-07-17 NOTE — Telephone Encounter (Signed)
I spoke with Mr Evan Cabrera and on 07/15/15 and 07/16/15 pt had low BS around 10:30 AM. 07/16/15 10:30 dropped to 45;after eating took approx 30-45 mins for BS to get to 85. pt taking glipizide,metformin and lantus as instructed. 07/17/15 FBS 135; pt feels OK now.pt eats per diabetic diet.Pt thinks meds may need to be adjusted. Pt scheduled appt 07/17/15 at 3:45 with Dr Darnell Level. Pt will cb if condition changes or worsens prior to appt. Pt was offered appt this morning but having air conditioning problems at home and waiting for repair man. Pt also still very concerned about his vision. Vision is better than when dx with diabetes in June but still not where needs to be.

## 2015-07-17 NOTE — Telephone Encounter (Signed)
Spoke to pt's daughter, apologized that they had not received return call, re-routing call to PCP.  Declined appointment 7/13 or 7/14, pt wants to keep appointment 07/21/15.  Declined [facility], call routed to triage directly for Rena to address as able.  No DPR, pt has and will complete and bring in on Monday.

## 2015-07-17 NOTE — Telephone Encounter (Signed)
Let's stop the glipizide until he sees me on Monday. Thank you.

## 2015-07-17 NOTE — Telephone Encounter (Signed)
Pt has already taken glipizide this morning; pt said he feels OK now and will cancel appt for today at 3:45 and plan to see Dr Darnell Level on Mon. Pt will cb if needed prior to appt. And pt will stop glipizide until seen on Mon. Apologized again for no cb on 07/16/15 and pt was understanding. FYI to Dr Darnell Level.

## 2015-07-18 LAB — FRUCTOSAMINE: FRUCTOSAMINE: 318 umol/L — AB (ref 190–270)

## 2015-07-21 ENCOUNTER — Encounter: Payer: Self-pay | Admitting: Family Medicine

## 2015-07-21 ENCOUNTER — Ambulatory Visit (INDEPENDENT_AMBULATORY_CARE_PROVIDER_SITE_OTHER): Payer: Managed Care, Other (non HMO) | Admitting: Family Medicine

## 2015-07-21 VITALS — BP 100/60 | HR 64 | Temp 97.9°F | Wt 167.0 lb

## 2015-07-21 DIAGNOSIS — E785 Hyperlipidemia, unspecified: Secondary | ICD-10-CM | POA: Diagnosis not present

## 2015-07-21 DIAGNOSIS — E118 Type 2 diabetes mellitus with unspecified complications: Secondary | ICD-10-CM | POA: Diagnosis not present

## 2015-07-21 DIAGNOSIS — H538 Other visual disturbances: Secondary | ICD-10-CM

## 2015-07-21 DIAGNOSIS — E1165 Type 2 diabetes mellitus with hyperglycemia: Secondary | ICD-10-CM

## 2015-07-21 DIAGNOSIS — IMO0002 Reserved for concepts with insufficient information to code with codable children: Secondary | ICD-10-CM

## 2015-07-21 NOTE — Progress Notes (Addendum)
BP 100/60 mmHg  Pulse 64  Temp(Src) 97.9 F (36.6 C) (Oral)  Wt 167 lb (75.751 kg)  SpO2 98%   CC: 1 mo f/u DM  Subjective:    Patient ID: Evan Cabrera, male    DOB: 31-May-1960, 55 y.o.   MRN: [id number]  HPI: Evan Cabrera is a 55 y.o. male presenting on 07/21/2015 for Diabetes   Pleasant local pastor.   See recent phone note for details. Recent dx DM last month s/p hospitalization with hyperglycemia and blurry vision. Vision troubles continue, has seen optometrist - told to just give this time. Better long distance - was able to drive here today. Persistent completely blurry vision. Has appt on Friday with Dr Loanne Drilling endocrinologist - may cancel. Didn't have good experience with trying to make appointment with endo "what makes you think your vision is at all related to diabetes".   Using his reading glasses actually worsens vision. Wife has had to work from home for last 3 weeks to care for him with vision loss. Daughter who is RN has also helped care for him at home. 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 [place]ellen today 20/70 - this is some improvement.  *ADDENDUM ==> pt cancelled [place] ophtho appt  DM - regularly does check sugars and brings log. In the last week had 2 lows to 45 treated with glucose pills, sugar. Compliant with antihyperglycemic regimen which includes: lantus 12u at bedtime, glipizide 5mg  bid, and metformin 1000mg  bid. Since lows, we stopped glipizide. He has continued taking night time glipizide because of am highs. Denies paresthesias. Last diabetic eye exam 06/10/2015 (optometrist). Pneumovax: due - next visit. Prevnar: not due.  Lab Results  Component Value Date   HGBA1C 13.9* 06/12/2015  fructosamine 318 (A1c equivalent to 7.7%) (07/2015) Diabetic Foot Exam - Simple   No data filed      To schedule appt with [facility] for diabetes education.   Relevant past medical, surgical, family and social history reviewed and updated as indicated. Interim medical  history since our last visit reviewed. Allergies and medications reviewed and updated. Current Outpatient Prescriptions on File Prior to Visit  Medication Sig  . acetaminophen (TYLENOL) 500 MG tablet Take 1,000 mg by mouth every 6 (six) hours as needed for mild pain, moderate pain, fever or headache.  . ezetimibe-simvastatin (VYTORIN) 10-40 MG tablet Take 1 tablet by mouth at bedtime. Muat keep new appw w/new PCP for refills  . folic acid (FOLVITE) Q000111Q MCG tablet Take 800 mcg by mouth daily.    Marland Kitchen glipiZIDE (GLUCOTROL) 5 MG tablet Take 1 tablet (5 mg total) by mouth 2 (two) times daily before a meal. (Patient taking differently: Take 5 mg by mouth daily before breakfast. )  . glucose blood test strip 1 each by Other route 4 (four) times daily. Use to check sugar four times daily. Dx: E11.9 **One Touch Ultra**  . insulin glargine (LANTUS) 100 UNIT/ML injection Inject 12 Units into the skin at bedtime.  . Insulin Pen Needle 31G X 5 MM MISC 12 Units by Does not apply route at bedtime.  . metFORMIN (GLUCOPHAGE) 1000 MG tablet Take 1 tablet (1,000 mg total) by mouth 2 (two) times daily with a meal.  . Multiple Vitamin (MULTIVITAMIN) tablet Take 1 tablet by mouth daily.    . Omega-3 Fatty Acids (FISH OIL) 1200 MG CAPS Take 1,200 mg by mouth daily.    . vitamin C (ASCORBIC ACID) 500 MG tablet Take 500 mg by mouth  daily.    . warfarin (COUMADIN) 5 MG tablet Take as directed by [facility] (Patient taking differently: Take 5-7.5 mg by mouth See admin instructions. Take 5mg  every day except Monday, Wednesday, and Fridays take 7.5mg s)   No current facility-administered medications on file prior to visit.    Review of Systems Per HPI unless specifically indicated in ROS section     Objective:    BP 100/60 mmHg  Pulse 64  Temp(Src) 97.9 F (36.6 C) (Oral)  Wt 167 lb (75.751 kg)  SpO2 98%  Wt Readings from Last 3 Encounters:  07/21/15 167 lb (75.751 kg)  06/18/15 164 lb (74.39 kg)  06/12/15  175 lb 7.8 oz (79.6 kg)    Physical Exam  Constitutional: He appears well-developed and well-nourished. No distress.  HENT:  Head: Normocephalic and atraumatic.  Mouth/Throat: Oropharynx is clear and moist. No oropharyngeal exudate.  Cardiovascular: Normal rate, regular rhythm, normal heart sounds and intact distal pulses.   No murmur heard. Pulmonary/Chest: Effort normal and breath sounds normal. No respiratory distress. He has no wheezes. He has no rales.  Musculoskeletal: He exhibits no edema.  Skin: Skin is warm and dry. No rash noted.  Psychiatric: He has a normal mood and affect.  Nursing note and vitals reviewed.  Results for orders placed or performed in visit on 07/16/15  Lipid panel  Result Value Ref Range   Cholesterol 131 0 - 200 mg/dL   Triglycerides 134.0 0.0 - 149.0 mg/dL   HDL 41.00 >39.00 mg/dL   VLDL 26.8 0.0 - 40.0 mg/dL   LDL Cholesterol 63 0 - 99 mg/dL   Total CHOL/HDL Ratio 3    NonHDL 90.09   Fructosamine  Result Value Ref Range   Fructosamine 318 (H) 190 - 270 umol/L  Magnesium  Result Value Ref Range   Magnesium 2.2 1.5 - 2.5 mg/dL      Assessment & Plan:   Problem List Items Addressed This Visit    Blurry vision, bilateral    Marked persistent blurry vision over last 4 wks. More severe than I have seen in the past. I have suggested referral to ophthalmology for evaluation to ensure no other etiology as cause of blurry vision. cbg's much improved, blurry vision persists.       Relevant Orders   Ambulatory referral to Ophthalmology   Diabetes mellitus type 2, uncontrolled, with complications ([place]) - Primary    Slowly improving readings. Reviewed log he brings, asked to scan.  Continue current regimen, with change of D/C am glipizide.  Reviewed his poor health care experience over the last few weeks.  Discussed glycemic control, pending DSME.  Reviewing chart, A1c 6.3% 12/2014. Marked sudden uncontrolled diabetes over last 6 months. Pt attributes  to increased stress recently. Given marked deterioration, I will check abd [place] to eval pancreas as well.  See below re: ophtho referral.       Relevant Orders   Ambulatory referral to Ophthalmology   US Abdomen Complete   HLD (hyperlipidemia)    Chronic, stable. Continue current regimen.           Follow up plan: Return in about 8 weeks (around 09/14/2015) for follow up visit.  [name], MD

## 2015-07-21 NOTE — Assessment & Plan Note (Signed)
Chronic, stable. Continue current regimen. 

## 2015-07-21 NOTE — Progress Notes (Signed)
Pre visit review using our clinic review tool, if applicable. No additional management support is needed unless otherwise documented below in the visit note. 

## 2015-07-21 NOTE — Patient Instructions (Addendum)
Pass by our referral coordinators to schedule appointment for ophthalmologist evaluation. We will also schedule abdominal ultrasound.   Continue medicines as up to now - congratulations on healthy diet and lifestyle changes - we are seeing improvements.  Schedule follow up after September 8th for recheck labs.

## 2015-07-21 NOTE — Assessment & Plan Note (Signed)
Marked persistent blurry vision over last 4 wks. More severe than I have seen in the past. I have suggested referral to ophthalmology for evaluation to ensure no other etiology as cause of blurry vision. cbg's much improved, blurry vision persists.

## 2015-07-21 NOTE — Assessment & Plan Note (Addendum)
Slowly improving readings. Reviewed log he brings, asked to scan.  Continue current regimen, with change of D/C am glipizide.  Reviewed his poor health care experience over the last few weeks.  Discussed glycemic control, pending DSME.  Reviewing chart, A1c 6.3% 12/2014. Marked sudden uncontrolled diabetes over last 6 months. Pt attributes to increased stress recently. Given marked deterioration, I will check abd [place] to eval pancreas as well.  See below re: ophtho referral.

## 2015-07-25 ENCOUNTER — Ambulatory Visit: Payer: Managed Care, Other (non HMO) | Admitting: Endocrinology

## 2015-07-25 ENCOUNTER — Encounter: Payer: Self-pay | Admitting: Family Medicine

## 2015-07-25 MED ORDER — METFORMIN HCL 1000 MG PO TABS: 1000.0000 mg | ORAL_TABLET | Freq: Two times a day (BID) | ORAL | Status: DC

## 2015-07-25 MED ORDER — GLIPIZIDE 5 MG PO TABS: 5.0000 mg | ORAL_TABLET | Freq: Every evening | ORAL | Status: DC

## 2015-07-28 MED ORDER — METFORMIN HCL 1000 MG PO TABS: 1000.0000 mg | ORAL_TABLET | Freq: Two times a day (BID) | ORAL | 3 refills | Status: DC

## 2015-07-28 NOTE — Telephone Encounter (Signed)
plz call and cancel Rx for [facility]ide and [facility] - pt will [facility]. Will also stop glipizide at this time.

## 2015-07-28 NOTE — Addendum Note (Signed)
Addended by: [name] on: 07/28/2015 01:51 PM   Modules accepted: Orders

## 2015-08-01 ENCOUNTER — Ambulatory Visit
Admission: RE | Admit: 2015-08-01 | Discharge: 2015-08-01 | Disposition: A | Discharge status: Home / Self Care | Payer: Managed Care, Other (non HMO) | Source: Ambulatory Visit | Attending: Family Medicine | Admitting: Family Medicine

## 2015-08-01 DIAGNOSIS — E118 Type 2 diabetes mellitus with unspecified complications: Principal | ICD-10-CM

## 2015-08-01 DIAGNOSIS — IMO0002 Reserved for concepts with insufficient information to code with codable children: Secondary | ICD-10-CM

## 2015-08-01 DIAGNOSIS — E1165 Type 2 diabetes mellitus with hyperglycemia: Secondary | ICD-10-CM

## 2015-08-05 ENCOUNTER — Ambulatory Visit (INDEPENDENT_AMBULATORY_CARE_PROVIDER_SITE_OTHER): Payer: Managed Care, Other (non HMO) | Admitting: *Deleted

## 2015-08-05 DIAGNOSIS — I2699 Other pulmonary embolism without acute cor pulmonale: Secondary | ICD-10-CM | POA: Diagnosis not present

## 2015-08-05 DIAGNOSIS — I82409 Acute embolism and thrombosis of unspecified deep veins of unspecified lower extremity: Secondary | ICD-10-CM | POA: Diagnosis not present

## 2015-08-05 DIAGNOSIS — D682 Hereditary deficiency of other clotting factors: Secondary | ICD-10-CM | POA: Diagnosis not present

## 2015-08-05 DIAGNOSIS — D6851 Activated protein C resistance: Secondary | ICD-10-CM

## 2015-08-05 DIAGNOSIS — Z7901 Long term (current) use of anticoagulants: Secondary | ICD-10-CM

## 2015-08-05 LAB — POCT INR: INR: 2.2

## 2015-08-26 ENCOUNTER — Ambulatory Visit (INDEPENDENT_AMBULATORY_CARE_PROVIDER_SITE_OTHER): Payer: Managed Care, Other (non HMO) | Admitting: *Deleted

## 2015-08-26 DIAGNOSIS — D6851 Activated protein C resistance: Secondary | ICD-10-CM

## 2015-08-26 DIAGNOSIS — I82409 Acute embolism and thrombosis of unspecified deep veins of unspecified lower extremity: Secondary | ICD-10-CM | POA: Diagnosis not present

## 2015-08-26 DIAGNOSIS — Z7901 Long term (current) use of anticoagulants: Secondary | ICD-10-CM

## 2015-08-26 DIAGNOSIS — D682 Hereditary deficiency of other clotting factors: Secondary | ICD-10-CM

## 2015-08-26 DIAGNOSIS — I2699 Other pulmonary embolism without acute cor pulmonale: Secondary | ICD-10-CM | POA: Diagnosis not present

## 2015-08-26 LAB — POCT INR: INR: 2.4

## 2015-09-15 ENCOUNTER — Ambulatory Visit (INDEPENDENT_AMBULATORY_CARE_PROVIDER_SITE_OTHER): Payer: Managed Care, Other (non HMO) | Admitting: Family Medicine

## 2015-09-15 ENCOUNTER — Ambulatory Visit (INDEPENDENT_AMBULATORY_CARE_PROVIDER_SITE_OTHER): Payer: Managed Care, Other (non HMO) | Admitting: *Deleted

## 2015-09-15 ENCOUNTER — Encounter: Payer: Self-pay | Admitting: Family Medicine

## 2015-09-15 VITALS — BP 122/74 | HR 60 | Temp 97.6°F | Wt 165.0 lb

## 2015-09-15 DIAGNOSIS — D682 Hereditary deficiency of other clotting factors: Secondary | ICD-10-CM | POA: Diagnosis not present

## 2015-09-15 DIAGNOSIS — E1165 Type 2 diabetes mellitus with hyperglycemia: Secondary | ICD-10-CM | POA: Diagnosis not present

## 2015-09-15 DIAGNOSIS — D6851 Activated protein C resistance: Secondary | ICD-10-CM

## 2015-09-15 DIAGNOSIS — I2699 Other pulmonary embolism without acute cor pulmonale: Secondary | ICD-10-CM

## 2015-09-15 DIAGNOSIS — Z7901 Long term (current) use of anticoagulants: Secondary | ICD-10-CM

## 2015-09-15 DIAGNOSIS — I82409 Acute embolism and thrombosis of unspecified deep veins of unspecified lower extremity: Secondary | ICD-10-CM

## 2015-09-15 DIAGNOSIS — E118 Type 2 diabetes mellitus with unspecified complications: Secondary | ICD-10-CM

## 2015-09-15 DIAGNOSIS — Z23 Encounter for immunization: Secondary | ICD-10-CM

## 2015-09-15 DIAGNOSIS — IMO0002 Reserved for concepts with insufficient information to code with codable children: Secondary | ICD-10-CM

## 2015-09-15 DIAGNOSIS — H538 Other visual disturbances: Secondary | ICD-10-CM | POA: Diagnosis not present

## 2015-09-15 LAB — POCT INR: INR: 3.4

## 2015-09-15 LAB — MICROALBUMIN / CREATININE URINE RATIO
Creatinine,U: 28.3 mg/dL
Microalb Creat Ratio: 2.5 mg/g (ref 0.0–30.0)
Microalb, Ur: <0.7 mg/dL (ref 0.0–1.9)

## 2015-09-15 LAB — LIPASE: Lipase: 46 U/L (ref 11.0–59.0)

## 2015-09-15 LAB — HEMOGLOBIN A1C: HEMOGLOBIN A1C: 7.1 % — AB (ref 4.6–6.5)

## 2015-09-15 MED ORDER — GLUCOSE BLOOD VI STRP
1.0000 | ORAL_STRIP | Freq: Four times a day (QID) | 11 refills | Status: DC
Start: 2015-09-15 — End: 2016-10-27

## 2015-09-15 MED ORDER — METFORMIN HCL 1000 MG PO TABS: ORAL_TABLET | ORAL | 3 refills | Status: DC

## 2015-09-15 MED ORDER — INSULIN GLARGINE 100 UNIT/ML ~~LOC~~ SOLN: 15.0000 [IU] | Freq: Every day | SUBCUTANEOUS | 3 refills | Status: DC

## 2015-09-15 MED ORDER — GLIPIZIDE 5 MG PO TABS: 5.0000 mg | ORAL_TABLET | Freq: Every day | ORAL | 1 refills | Status: DC

## 2015-09-15 NOTE — Progress Notes (Signed)
BP 122/74   Pulse 60   Temp 97.6 F (36.4 C) (Oral)   Wt 165 lb (74.8 kg)   BMI 25.09 kg/m    CC: 2 mo f/u visit Subjective:    Patient ID: Evan Cabrera, male    DOB: [birth year], 55 y.o.   MRN: [id number]  HPI: Evan Cabrera is a 55 y.o. male presenting on 09/15/2015 for Follow-up   Recent trip to [place] [address].   DM - regularly does check sugars 120 am and lunch, checks three times daily. Compliant with antihyperglycemic regimen which includes: lantus 12u at bedtime, metformin 1000mg  bid. Stopped glipizide due to hypoglycemia. Denies further low sugars or hypoglycemic symptoms.  Denies paresthesias. Last diabetic eye exam 06/2015.  Pneumovax: DUE.  Prevnar: not due. Will schedule diabetes education at [facility]. Vision has improved.  Recent vacation trip - missed 1 lantus dose, has been fighting sugars ever since. Counting carbs, only drinks water. Has actually started taking some PM glipizide 5mg .  Lab Results  Component Value Date   HGBA1C 13.9 (H) 06/12/2015  fructosamine 318 (A1c equivalent to 7.7%) (07/2015) Diabetic Foot Exam - Simple   No data filed       Blurry vision - has slowly resolved. Ended up not seeing ophthalmology.   Relevant past medical, surgical, family and social history reviewed and updated as indicated. Interim medical history since our last visit reviewed. Allergies and medications reviewed and updated. Current Outpatient Prescriptions on File Prior to Visit  Medication Sig  . ezetimibe-simvastatin (VYTORIN) 10-40 MG tablet Take 1 tablet by mouth at bedtime. Muat keep new appw w/new PCP for refills  . folic acid (FOLVITE) Q000111Q MCG tablet Take 800 mcg by mouth daily.    . Insulin Pen Needle 31G X 5 MM MISC 12 Units by Does not apply route at bedtime.  . Multiple Vitamin (MULTIVITAMIN) tablet Take 1 tablet by mouth daily.    . Omega-3 Fatty Acids (FISH OIL) 1200 MG CAPS Take 1,200 mg by mouth daily.    . vitamin C (ASCORBIC ACID) 500 MG tablet Take  500 mg by mouth daily.    Marland Kitchen warfarin (COUMADIN) 5 MG tablet Take as directed by [facility] (Patient taking differently: Take 5-7.5 mg by mouth See admin instructions. Take 5mg  every day except Monday, Wednesday, and Fridays take 7.5mg s)   No current facility-administered medications on file prior to visit.     Review of Systems Per HPI unless specifically indicated in ROS section     Objective:    BP 122/74   Pulse 60   Temp 97.6 F (36.4 C) (Oral)   Wt 165 lb (74.8 kg)   BMI 25.09 kg/m   Wt Readings from Last 3 Encounters:  09/15/15 165 lb (74.8 kg)  07/21/15 167 lb (75.8 kg)  06/18/15 164 lb (74.4 kg)    Physical Exam  Constitutional: He appears well-developed and well-nourished. No distress.  HENT:  Head: Normocephalic and atraumatic.  Right Ear: External ear normal.  Left Ear: External ear normal.  Nose: Nose normal.  Mouth/Throat: Oropharynx is clear and moist. No oropharyngeal exudate.  Eyes: Conjunctivae and EOM are normal. Pupils are equal, round, and reactive to light. No scleral icterus.  Neck: Normal range of motion. Neck supple.  Cardiovascular: Normal rate, regular rhythm, normal heart sounds and intact distal pulses.   No murmur heard. Pulmonary/Chest: Effort normal and breath sounds normal. No respiratory distress. He has no wheezes. He has no rales.  Abdominal: Soft. Normal  appearance and bowel sounds are normal. He exhibits no distension and no mass. There is no hepatosplenomegaly. There is no tenderness. There is no rigidity, no rebound, no guarding, no CVA tenderness and negative Murphy's sign. No hernia.  Musculoskeletal: He exhibits no edema.  See HPI for foot exam if done  Lymphadenopathy:    He has no cervical adenopathy.  Skin: Skin is warm and dry. No rash noted.  Psychiatric: He has a normal mood and affect.  Nursing note and vitals reviewed.  Results for orders placed or performed in visit on 08/26/15  POCT INR  Result Value Ref Range    INR 2.4       Assessment & Plan:   Problem List Items Addressed This Visit    Blurry vision, bilateral    Continues improving with better glycemic control.      Diabetes mellitus type 2, uncontrolled, with complications ([place]) - Primary    Chronic. Deteriorated readings this week correlate to missed lantus dose on Monday as well as self-decreased metformin due to GI upset. Slowly titrate lantus to 15 units daily if tolerated, start glipizide 5mg  in am, decrease metformin dose to 500/1000mg  daily. RTC 4-6 wks f/u visit. Pt will call to schedule [facility] diabetes education and nutritionist appt. Pt very motivated for tight glycemic control.      Relevant Medications   insulin glargine (LANTUS) 100 UNIT/ML injection   metFORMIN (GLUCOPHAGE) 1000 MG tablet   glipiZIDE (GLUCOTROL) 5 MG tablet   Other Relevant Orders   Hemoglobin A1c   Microalbumin / creatinine urine ratio   Lipase    Other Visit Diagnoses    Need for influenza vaccination       Relevant Orders   Flu Vaccine QUAD 36+ mos PF IM (Fluarix & Fluzone Quad PF)       Follow up plan: Return in about 6 weeks (around 10/27/2015), or as needed, for follow up visit.  [name], MD

## 2015-09-15 NOTE — Assessment & Plan Note (Signed)
Chronic. Deteriorated readings this week correlate to missed lantus dose on Monday as well as self-decreased metformin due to GI upset. Slowly titrate lantus to 15 units daily if tolerated, start glipizide 5mg  in am, decrease metformin dose to 500/1000mg  daily. RTC 4-6 wks f/u visit. Pt will call to schedule [facility] diabetes education and nutritionist appt. Pt very motivated for tight glycemic control.

## 2015-09-15 NOTE — Patient Instructions (Addendum)
Flu and pneumovax today.  Labs today, urine checked today.  Decrease metformin to 500mg  in the morning and 1000mg  at night time (with option to decrease metformin to 500mg  twice daily).  Slowly increase lantus by 1 unit every 3 days to 15 units.  Add glipizide 5mg  in the morning.  Watch sugars and let me know how they're running.

## 2015-09-15 NOTE — Assessment & Plan Note (Signed)
Continues improving with better glycemic control.

## 2015-09-15 NOTE — Progress Notes (Signed)
Pre visit review using our clinic review tool, if applicable. No additional management support is needed unless otherwise documented below in the visit note. 

## 2015-09-19 ENCOUNTER — Encounter: Payer: Self-pay | Admitting: Family Medicine

## 2015-09-19 MED ORDER — INSULIN DETEMIR 100 UNIT/ML FLEXPEN: 15.0000 [IU] | PEN_INJECTOR | Freq: Every day | SUBCUTANEOUS | 3 refills | Status: DC

## 2015-09-19 NOTE — Telephone Encounter (Signed)
Lantus requires PA. Preferred meds are Tyler Aas, [employer] manager. Do you want me to do PA or do you want to change med? Per Mychart message, he only has ~1 week of lantus left.

## 2015-09-19 NOTE — Telephone Encounter (Signed)
Will trial levemir - sent to local pharmacy.

## 2015-09-19 NOTE — Telephone Encounter (Signed)
Pt left v/m requesting response to mychart message about Lantus. Can reply thru mychart or call pt at [phone].

## 2015-10-14 ENCOUNTER — Ambulatory Visit (INDEPENDENT_AMBULATORY_CARE_PROVIDER_SITE_OTHER): Payer: Managed Care, Other (non HMO) | Admitting: *Deleted

## 2015-10-14 DIAGNOSIS — I82409 Acute embolism and thrombosis of unspecified deep veins of unspecified lower extremity: Secondary | ICD-10-CM

## 2015-10-14 DIAGNOSIS — I2699 Other pulmonary embolism without acute cor pulmonale: Secondary | ICD-10-CM

## 2015-10-14 DIAGNOSIS — Z7901 Long term (current) use of anticoagulants: Secondary | ICD-10-CM | POA: Diagnosis not present

## 2015-10-14 DIAGNOSIS — D6851 Activated protein C resistance: Secondary | ICD-10-CM

## 2015-10-14 DIAGNOSIS — D682 Hereditary deficiency of other clotting factors: Secondary | ICD-10-CM

## 2015-10-14 LAB — POCT INR: INR: 3

## 2015-10-16 ENCOUNTER — Encounter: Payer: Self-pay | Admitting: Family Medicine

## 2015-10-17 MED ORDER — GLIPIZIDE 5 MG PO TABS: 5.0000 mg | ORAL_TABLET | Freq: Two times a day (BID) | ORAL | 1 refills | Status: DC

## 2015-10-22 ENCOUNTER — Other Ambulatory Visit: Payer: Self-pay | Admitting: Internal Medicine

## 2015-10-27 ENCOUNTER — Encounter: Payer: Self-pay | Admitting: Family Medicine

## 2015-10-27 MED ORDER — EZETIMIBE-SIMVASTATIN 10-40 MG PO TABS: 1.0000 | ORAL_TABLET | Freq: Every day | ORAL | 1 refills | Status: DC

## 2015-10-29 ENCOUNTER — Encounter: Payer: Self-pay | Admitting: Family Medicine

## 2015-10-29 NOTE — Telephone Encounter (Signed)
Not needed per patient.

## 2015-11-11 ENCOUNTER — Ambulatory Visit: Payer: Managed Care, Other (non HMO) | Admitting: Dietician

## 2015-11-11 ENCOUNTER — Ambulatory Visit (INDEPENDENT_AMBULATORY_CARE_PROVIDER_SITE_OTHER): Payer: Managed Care, Other (non HMO) | Admitting: *Deleted

## 2015-11-11 DIAGNOSIS — D6851 Activated protein C resistance: Secondary | ICD-10-CM

## 2015-11-11 DIAGNOSIS — D682 Hereditary deficiency of other clotting factors: Secondary | ICD-10-CM

## 2015-11-11 DIAGNOSIS — Z7901 Long term (current) use of anticoagulants: Secondary | ICD-10-CM

## 2015-11-11 DIAGNOSIS — I82409 Acute embolism and thrombosis of unspecified deep veins of unspecified lower extremity: Secondary | ICD-10-CM | POA: Diagnosis not present

## 2015-11-11 DIAGNOSIS — I2699 Other pulmonary embolism without acute cor pulmonale: Secondary | ICD-10-CM

## 2015-11-11 LAB — POCT INR: INR: 3.3

## 2015-11-13 ENCOUNTER — Encounter: Payer: Managed Care, Other (non HMO) | Attending: Family Medicine | Admitting: Dietician

## 2015-11-13 DIAGNOSIS — E118 Type 2 diabetes mellitus with unspecified complications: Secondary | ICD-10-CM

## 2015-11-13 DIAGNOSIS — E119 Type 2 diabetes mellitus without complications: Secondary | ICD-10-CM | POA: Insufficient documentation

## 2015-11-13 DIAGNOSIS — IMO0002 Reserved for concepts with insufficient information to code with codable children: Secondary | ICD-10-CM

## 2015-11-13 DIAGNOSIS — E1165 Type 2 diabetes mellitus with hyperglycemia: Secondary | ICD-10-CM

## 2015-11-13 DIAGNOSIS — Z713 Dietary counseling and surveillance: Secondary | ICD-10-CM | POA: Insufficient documentation

## 2015-11-13 DIAGNOSIS — Z794 Long term (current) use of insulin: Secondary | ICD-10-CM

## 2015-11-13 NOTE — Patient Instructions (Signed)
Continue your active lifestyle. Continue the healthy diet changes that you have made.  Aim for 4 Carb Choices per meal (60 grams) +/- 1 either way  Aim for 0-1 Carbs per snack if hungry  Include protein in moderation with your meals and snacks Consider reading food labels for Total Carbohydrate and Fat Grams of foods Consider checking BG at alternate times per day as directed by MD  Consider taking medication as directed by MD

## 2015-11-14 ENCOUNTER — Encounter: Payer: Self-pay | Admitting: Dietician

## 2015-11-14 NOTE — Progress Notes (Signed)
Diabetes Self-Management Education  Visit Type: First/Initial  Appt. Start Time: 1610 Appt. End Time: [id number]  11/14/2015  [name], identified by name and date of birth, is a 55 y.o. male with a diagnosis of Diabetes: Type 2.  In June 2017, he noted increased thirst and urination but thought it was due to increased work and weather.  He experienced vision changes he thought secondary to age and presented to his optometrist.  The following day he was in the ER and found to have an A1C of 15%.  He was educated in the hospital.  4 days later he lost his vision for 7 weeks.  This has since returned.  His A1C has decreased to 7.1%.  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  Patient lives with his wife.  There daughter is a Marine scientist.  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    ASSESSMENT  Height 5\' 8"  (1.727 m), weight 165 lb (74.8 kg). Body mass index is 25.09 kg/m.      Diabetes Self-Management Education - 11/13/15 1619      Visit Information   Visit Type First/Initial     Initial Visit   Diabetes Type Type 2   Are you currently following a meal plan? Yes   What type of meal plan do you follow? low carbohydrate   Are you taking your medications as prescribed? Yes   Date Diagnosed 06/2015     Health Coping   How would you rate your overall health? Good     Psychosocial Assessment   Patient Belief/Attitude about Diabetes Motivated to manage diabetes   Self-care barriers None   Self-management support Doctor's office;Family   Other persons present Patient;Spouse/SO   Patient Concerns Nutrition/Meal planning;Glycemic Control   Special Needs None   Preferred Learning Style No preference indicated   Learning Readiness Not Ready   How often do you need to have someone help you when you read instructions, pamphlets, or other written materials from your doctor or pharmacy? 1  - Never   What is the last grade level you completed in school? 12th grade     Pre-Education Assessment   Patient understands the diabetes disease and treatment process. Needs Review   Patient understands incorporating nutritional management into lifestyle. Needs Review   Patient undertands incorporating physical activity into lifestyle. Demonstrates understanding / competency   Patient understands using medications safely. Demonstrates understanding / competency   Patient understands monitoring blood glucose, interpreting and using results Demonstrates understanding / competency   Patient understands prevention, detection, and treatment of acute complications. Needs Review   Patient understands prevention, detection, and treatment of chronic complications. Needs Review   Patient understands how to develop strategies to address psychosocial issues. Needs Review   Patient understands how to develop strategies to promote health/change behavior. Needs Review     Complications   Last HgB A1C per patient/outside source 7.1 %  09/15/15 decreased from 14.9% in June when diagnosed   How often do you check your blood sugar? > 4 times/day   Fasting Blood glucose range (mg/dL) 130-179;70-129   Number of hypoglycemic episodes per month 0   Number of hyperglycemic episodes per week 0   Have you had a dilated eye exam in the past 12 months? Yes   Have you had a dental exam in the past 12 months? Yes   Are  you checking your feet? Yes   How many days per week are you checking your feet? 7     Dietary Intake   Breakfast egg and cheese sandwich on [employer] OR if CBG is good small bowl honey nut cheerios or grain cereal OR grits with toast AND coffee with creamer or black   7   Snack (morning) fruit   Lunch [place] sandwich on [employer] and 32 fritos  12   Snack (afternoon) fruit or nuts   Dinner chicken or pork chop (grilled or small amount olive oil) and, salad OR taco's OR [employer] spaghetti OR steak and veges  5:30    Snack (evening) none OR occasional snack of nuts or popcorn or 20 animal crackers   Beverage(s) water     Exercise   Exercise Type Moderate (swimming / aerobic walking)  cardio   How many days per week to you exercise? 5   How many minutes per day do you exercise? 30   Total minutes per week of exercise 150     Patient Education   Previous Diabetes Education Yes (please comment)  when hospitalized   Disease state  Definition of diabetes, type 1 and 2, and the diagnosis of diabetes;Factors that contribute to the development of diabetes   Nutrition management  Role of diet in the treatment of diabetes and the relationship between the three main macronutrients and blood glucose level;Food label reading, portion sizes and measuring food.;Carbohydrate counting;Meal options for control of blood glucose level and chronic complications.   Physical activity and exercise  Role of exercise on diabetes management, blood pressure control and cardiac health.   Monitoring Identified appropriate SMBG and/or A1C goals.;Yearly dilated eye exam;Daily foot exams   Acute complications Taught treatment of hypoglycemia - the 15 rule.   Chronic complications Relationship between chronic complications and blood glucose control;Dental care;Retinopathy and reason for yearly dilated eye exams;Identified and discussed with patient  current chronic complications   Psychosocial adjustment Role of stress on diabetes     Individualized Goals (developed by patient)   Nutrition Follow meal plan discussed   Physical Activity Exercise 5-7 days per week;30 minutes per day   Medications take my medication as prescribed   Monitoring  test my blood glucose as discussed   Reducing Risk examine blood glucose patterns   Health Coping discuss diabetes with (comment)  MD/RD     Post-Education Assessment   Patient understands the diabetes disease and treatment process. Demonstrates understanding / competency   Patient understands  incorporating nutritional management into lifestyle. Demonstrates understanding / competency   Patient undertands incorporating physical activity into lifestyle. Demonstrates understanding / competency   Patient understands using medications safely. Demonstrates understanding / competency   Patient understands monitoring blood glucose, interpreting and using results Demonstrates understanding / competency   Patient understands prevention, detection, and treatment of acute complications. Demonstrates understanding / competency   Patient understands prevention, detection, and treatment of chronic complications. Demonstrates understanding / competency   Patient understands how to develop strategies to address psychosocial issues. Demonstrates understanding / competency   Patient understands how to develop strategies to promote health/change behavior. Demonstrates understanding / competency     Outcomes   Expected Outcomes Demonstrated interest in learning. Expect positive outcomes   Future DMSE PRN   Program Status Completed      Individualized Plan for Diabetes Self-Management Training:   Learning Objective:  Patient will have a greater understanding of diabetes self-management. Patient education plan is to attend individual  and/or group sessions per assessed needs and concerns.   Plan:   Patient Instructions  Continue your active lifestyle. Continue the healthy diet changes that you have made.  Aim for 4 Carb Choices per meal (60 grams) +/- 1 either way  Aim for 0-1 Carbs per snack if hungry  Include protein in moderation with your meals and snacks Consider reading food labels for Total Carbohydrate and Fat Grams of foods Consider checking BG at alternate times per day as directed by MD  Consider taking medication as directed by MD    Expected Outcomes:  Demonstrated interest in learning. Expect positive outcomes  Education material provided: Living Well with Diabetes, Food label  handouts, A1C conversion sheet, Meal plan card, My Plate and Snack sheet  If problems or questions, patient to contact team via:  Phone and Email  Future DSME appointment: PRN

## 2015-11-26 ENCOUNTER — Ambulatory Visit (INDEPENDENT_AMBULATORY_CARE_PROVIDER_SITE_OTHER): Payer: Managed Care, Other (non HMO) | Admitting: Family Medicine

## 2015-11-26 ENCOUNTER — Encounter: Payer: Self-pay | Admitting: Family Medicine

## 2015-11-26 VITALS — BP 130/80 | HR 71 | Temp 98.0°F | Wt 163.0 lb

## 2015-11-26 DIAGNOSIS — J069 Acute upper respiratory infection, unspecified: Secondary | ICD-10-CM | POA: Diagnosis not present

## 2015-11-26 MED ORDER — HYDROCODONE-HOMATROPINE 5-1.5 MG/5ML PO SYRP: 5.0000 mL | ORAL_SOLUTION | Freq: Three times a day (TID) | ORAL | 0 refills | Status: DC | PRN

## 2015-11-26 MED ORDER — AMOXICILLIN 875 MG PO TABS: 875.0000 mg | ORAL_TABLET | Freq: Two times a day (BID) | ORAL | 0 refills | Status: DC

## 2015-11-26 NOTE — Progress Notes (Signed)
Subjective:    Patient ID: Evan Cabrera, male    DOB: 05/09/1960, 55 y.o.   MRN: [id number]  HPI This is a 55 yo male who presents today with cough, fever, chills x 7 days. Started with runny nose and low grade fever to 99.0. Cough is dry and sinus drainage is drier today. Feels clammy but not achy today, was achy several days ago. Little wheezing with cough, no SOB. Taking a cough expectorant without relief. Took Zicam for 5 days. No ear pain or sore throat. Blood sugars running 110s. Had a couple of high readings when he first started with symptoms. Has taken some acetaminophen for headache.   Past Medical History:  Diagnosis Date  . Benign prostatic hypertrophy history  . Depression 2005  . DVT (deep venous thrombosis) ([place]) 2005  . 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 Factor 5 Leiden mutation, heterozygous ([place])   . GERD (gastroesophageal reflux disease)   . Hyperlipemia   . Pulmonary embolism ([place]) [id number]  . 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 Type 2 diabetes mellitus ([place])    Past Surgical History:  Procedure Laterality Date  . COLONOSCOPY  10/2011   Dr Glennon Hamilton  . COLONOSCOPY  12/2014   rpt 5 yrs Glennon Hamilton)  . KNEE SURGERY Right 2003   for cartilage  . VASECTOMY  1988  . WISDOM TOOTH EXTRACTION     Family History  Problem Relation Age of Onset  . Pulmonary embolism Maternal Grandfather   . Heart attack Father 24    CBAG X1  . Deep vein thrombosis Father   . Diabetes Father   . Uterine cancer Mother   . Hypertension Mother   . Hyperlipidemia Mother   . Heart disease Mother     3 vessel CBAG @ 35  . Diabetes Brother   . Heart attack Brother 77    Diabetic  . Stroke Neg Hx    Social History  Substance Use Topics  . Smoking status: Never Smoker  . Smokeless tobacco: Never Used  . Alcohol use No      Review of Systems Per HPI    Objective:   Physical Exam Physical Exam  Constitutional: Oriented to person, place, and time. He appears  well-developed and well-nourished.  HENT:  Head: Normocephalic and atraumatic.  Eyes: Conjunctivae are normal.  Ears: bilateral TMs normal Nose: rhinorrhea and edema Throat: moderate amount thick post nasal drainage Neck: Normal range of motion. Neck supple.  Cardiovascular: Normal rate, regular rhythm and normal heart sounds.   Pulmonary/Chest: Effort normal and breath sounds normal.  Musculoskeletal: Normal range of motion.  Neurological: Alert and oriented to person, place, and time.  Skin: Skin is warm and dry.  Psychiatric: Normal mood and affect. Behavior is normal. Judgment and thought content normal.  Vitals reviewed.     BP 130/80   Pulse 71   Temp 98 F (36.7 C)   Wt 163 lb (73.9 kg)   SpO2 99%   BMI 24.78 kg/m  Wt Readings from Last 3 Encounters:  11/26/15 163 lb (73.9 kg)  11/13/15 165 lb (74.8 kg)  09/15/15 165 lb (74.8 kg)       Assessment & Plan:  1. Acute upper respiratory infection - suspect viral illness, provided wait and see antibiotic prescription if not better in 48 hours - acetaminophen for aches/fever, push fluids, saline nasal spray prn - HYDROcodone-homatropine (HYCODAN) 5-1.5 MG/5ML syrup; Take 5 mLs by mouth every  8 (eight) hours as needed for cough.  Dispense: 120 mL; Refill: 0 - amoxicillin (AMOXIL) 875 MG tablet; Take 1 tablet (875 mg total) by mouth 2 (two) times daily.  Dispense: 14 tablet; Refill: 0 - RTC precautions reviewed  Clarene Reamer, FNP-BC  [facility]  11/26/2015 3:44 PM

## 2015-11-26 NOTE — Patient Instructions (Signed)
Start cough syrup Take tylenol a couple of times a day for fever/pain Drink lots of fluids Use saline nasal spray several times a day If not better by Friday morning, start antibiotic  Upper Respiratory Infection, Adult Most upper respiratory infections (URIs) are a viral infection of the air passages leading to the lungs. A URI affects the nose, throat, and upper air passages. The most common type of URI is nasopharyngitis and is typically referred to as "the common cold." URIs run their course and usually go away on their own. Most of the time, a URI does not require medical attention, but sometimes a bacterial infection in the upper airways can follow a viral infection. This is called a secondary infection. Sinus and middle ear infections are common types of secondary upper respiratory infections. Bacterial pneumonia can also complicate a URI. A URI can worsen asthma and chronic obstructive pulmonary disease (COPD). Sometimes, these complications can require emergency medical care and may be life threatening. What are the causes? Almost all URIs are caused by viruses. A virus is a type of germ and can spread from one person to another. What increases the risk? You may be at risk for a URI if:  You smoke.  You have chronic heart or lung disease.  You have a weakened defense (immune) system.  You are very young or very old.  You have nasal allergies or asthma.  You work in crowded or poorly ventilated areas.  You work in health care facilities or schools. What are the signs or symptoms? Symptoms typically develop 2-3 days after you come in contact with a cold virus. Most viral URIs last 7-10 days. However, viral URIs from the influenza virus (flu virus) can last 14-18 days and are typically more severe. Symptoms may include:  Runny or stuffy (congested) nose.  Sneezing.  Cough.  Sore throat.  Headache.  Fatigue.  Fever.  Loss of appetite.  Pain in your forehead, behind  your eyes, and over your cheekbones (sinus pain).  Muscle aches. How is this diagnosed? Your health care provider may diagnose a URI by:  Physical exam.  Tests to check that your symptoms are not due to another condition such as:  Strep throat.  Sinusitis.  Pneumonia.  Asthma. How is this treated? A URI goes away on its own with time. It cannot be cured with medicines, but medicines may be prescribed or recommended to relieve symptoms. Medicines may help:  Reduce your fever.  Reduce your cough.  Relieve nasal congestion. Follow these instructions at home:  Take medicines only as directed by your health care provider.  Gargle warm saltwater or take cough drops to comfort your throat as directed by your health care provider.  Use a warm mist humidifier or inhale steam from a shower to increase air moisture. This may make it easier to breathe.  Drink enough fluid to keep your urine clear or pale yellow.  Eat soups and other clear broths and maintain good nutrition.  Rest as needed.  Return to work when your temperature has returned to normal or as your health care provider advises. You may need to stay home longer to avoid infecting others. You can also use a face mask and careful hand washing to prevent spread of the virus.  Increase the usage of your inhaler if you have asthma.  Do not use any tobacco products, including cigarettes, chewing tobacco, or electronic cigarettes. If you need help quitting, ask your health care provider. How is this  prevented? The best way to protect yourself from getting a cold is to practice good hygiene.  Avoid oral or hand contact with people with cold symptoms.  Wash your hands often if contact occurs. There is no clear evidence that vitamin C, vitamin E, echinacea, or exercise reduces the chance of developing a cold. However, it is always recommended to get plenty of rest, exercise, and practice good nutrition. Contact a health care  provider if:  You are getting worse rather than better.  Your symptoms are not controlled by medicine.  You have chills.  You have worsening shortness of breath.  You have brown or red mucus.  You have yellow or brown nasal discharge.  You have pain in your face, especially when you bend forward.  You have a fever.  You have swollen neck glands.  You have pain while swallowing.  You have white areas in the back of your throat. Get help right away if:  You have severe or persistent:  Headache.  Ear pain.  Sinus pain.  Chest pain.  You have chronic lung disease and any of the following:  Wheezing.  Prolonged cough.  Coughing up blood.  A change in your usual mucus.  You have a stiff neck.  You have changes in your:  Vision.  Hearing.  Thinking.  Mood. This information is not intended to replace advice given to you by your health care provider. Make sure you discuss any questions you have with your health care provider. Document Released: 06/16/2000 Document Revised: 08/24/2015 Document Reviewed: 03/28/2013 Elsevier Interactive Patient Education  2017 [employer].

## 2015-11-29 ENCOUNTER — Other Ambulatory Visit: Payer: Self-pay | Admitting: Internal Medicine

## 2015-12-15 ENCOUNTER — Other Ambulatory Visit: Payer: Self-pay | Admitting: Family Medicine

## 2015-12-18 ENCOUNTER — Encounter: Payer: Self-pay | Admitting: Family Medicine

## 2015-12-18 ENCOUNTER — Other Ambulatory Visit: Payer: Self-pay | Admitting: *Deleted

## 2015-12-18 MED ORDER — INSULIN PEN NEEDLE 31G X 5 MM MISC: 3 refills | Status: DC

## 2015-12-23 ENCOUNTER — Ambulatory Visit (INDEPENDENT_AMBULATORY_CARE_PROVIDER_SITE_OTHER): Payer: Managed Care, Other (non HMO)

## 2015-12-23 DIAGNOSIS — D682 Hereditary deficiency of other clotting factors: Secondary | ICD-10-CM | POA: Diagnosis not present

## 2015-12-23 DIAGNOSIS — I82409 Acute embolism and thrombosis of unspecified deep veins of unspecified lower extremity: Secondary | ICD-10-CM | POA: Diagnosis not present

## 2015-12-23 DIAGNOSIS — Z7901 Long term (current) use of anticoagulants: Secondary | ICD-10-CM

## 2015-12-23 DIAGNOSIS — I2699 Other pulmonary embolism without acute cor pulmonale: Secondary | ICD-10-CM

## 2015-12-23 DIAGNOSIS — D6851 Activated protein C resistance: Secondary | ICD-10-CM

## 2015-12-23 LAB — POCT INR: INR: 2

## 2016-01-07 ENCOUNTER — Encounter: Payer: Self-pay | Admitting: Family Medicine

## 2016-01-08 ENCOUNTER — Encounter: Payer: Self-pay | Admitting: Family Medicine

## 2016-01-08 ENCOUNTER — Encounter: Payer: Self-pay | Admitting: *Deleted

## 2016-01-08 ENCOUNTER — Other Ambulatory Visit: Payer: Self-pay | Admitting: Family Medicine

## 2016-01-08 ENCOUNTER — Ambulatory Visit (INDEPENDENT_AMBULATORY_CARE_PROVIDER_SITE_OTHER): Payer: Managed Care, Other (non HMO) | Admitting: Family Medicine

## 2016-01-08 VITALS — BP 122/82 | HR 65 | Temp 98.0°F | Ht 67.75 in | Wt 160.0 lb

## 2016-01-08 DIAGNOSIS — Z794 Long term (current) use of insulin: Secondary | ICD-10-CM

## 2016-01-08 DIAGNOSIS — N400 Enlarged prostate without lower urinary tract symptoms: Secondary | ICD-10-CM

## 2016-01-08 DIAGNOSIS — IMO0002 Reserved for concepts with insufficient information to code with codable children: Secondary | ICD-10-CM

## 2016-01-08 DIAGNOSIS — E118 Type 2 diabetes mellitus with unspecified complications: Secondary | ICD-10-CM

## 2016-01-08 DIAGNOSIS — E785 Hyperlipidemia, unspecified: Secondary | ICD-10-CM | POA: Diagnosis not present

## 2016-01-08 DIAGNOSIS — E1165 Type 2 diabetes mellitus with hyperglycemia: Secondary | ICD-10-CM | POA: Diagnosis not present

## 2016-01-08 DIAGNOSIS — D6851 Activated protein C resistance: Secondary | ICD-10-CM

## 2016-01-08 DIAGNOSIS — Z0000 Encounter for general adult medical examination without abnormal findings: Secondary | ICD-10-CM

## 2016-01-08 DIAGNOSIS — Z7901 Long term (current) use of anticoagulants: Secondary | ICD-10-CM

## 2016-01-08 DIAGNOSIS — G4730 Sleep apnea, unspecified: Secondary | ICD-10-CM

## 2016-01-08 LAB — CBC WITH DIFFERENTIAL/PLATELET
BASOS ABS: 0 10*3/uL (ref 0.0–0.1)
BASOS PCT: 0.1 % (ref 0.0–3.0)
EOS ABS: 0.1 10*3/uL (ref 0.0–0.7)
Eosinophils Relative: 1.2 % (ref 0.0–5.0)
HCT: 44.4 % (ref 39.0–52.0)
HEMOGLOBIN: 15.3 g/dL (ref 13.0–17.0)
LYMPHS PCT: 36 % (ref 12.0–46.0)
Lymphs Abs: 2.1 10*3/uL (ref 0.7–4.0)
MCHC: 34.4 g/dL (ref 30.0–36.0)
MCV: 91.4 fl (ref 78.0–100.0)
MONOS PCT: 6.2 % (ref 3.0–12.0)
Monocytes Absolute: 0.4 10*3/uL (ref 0.1–1.0)
Neutro Abs: 3.3 10*3/uL (ref 1.4–7.7)
Neutrophils Relative %: 56.5 % (ref 43.0–77.0)
Platelets: 200 10*3/uL (ref 150.0–400.0)
RBC: 4.86 Mil/uL (ref 4.22–5.81)
RDW: 12.9 % (ref 11.5–15.5)
WBC: 5.9 10*3/uL (ref 4.0–10.5)

## 2016-01-08 LAB — COMPREHENSIVE METABOLIC PANEL
ALK PHOS: 47 U/L (ref 39–117)
ALT: 31 U/L (ref 0–53)
AST: 25 U/L (ref 0–37)
Albumin: 4.5 g/dL (ref 3.5–5.2)
BUN: 19 mg/dL (ref 6–23)
CHLORIDE: 103 meq/L (ref 96–112)
CO2: 32 mEq/L (ref 19–32)
Calcium: 9.3 mg/dL (ref 8.4–10.5)
Creatinine, Ser: 1.07 mg/dL (ref 0.40–1.50)
GFR: 75.57 mL/min (ref >60.00)
GLUCOSE: 130 mg/dL — AB (ref 70–99)
POTASSIUM: 4.9 meq/L (ref 3.5–5.1)
Sodium: 140 mEq/L (ref 135–145)
TOTAL PROTEIN: 6.6 g/dL (ref 6.0–8.3)
Total Bilirubin: 0.4 mg/dL (ref 0.2–1.2)

## 2016-01-08 LAB — HEMOGLOBIN A1C: Hgb A1c MFr Bld: 6.6 % — ABNORMAL HIGH (ref 4.6–6.5)

## 2016-01-08 LAB — LIPID PANEL
CHOL/HDL RATIO: 4
Cholesterol: 154 mg/dL (ref 0–200)
HDL: 42.7 mg/dL (ref >39.00)
LDL CALC: 89 mg/dL (ref 0–99)
NonHDL: 111.07
Triglycerides: 109 mg/dL (ref 0.0–149.0)
VLDL: 21.8 mg/dL (ref 0.0–40.0)

## 2016-01-08 LAB — PSA: PSA: 1.2 ng/mL (ref 0.10–4.00)

## 2016-01-08 NOTE — Assessment & Plan Note (Signed)
Desires to establish with our coumadin clinic nurse [facility] - advised to schedule this up front.

## 2016-01-08 NOTE — Assessment & Plan Note (Signed)
Check today 

## 2016-01-08 NOTE — Assessment & Plan Note (Signed)
Chronic, stable. Check FLP. Continue vytorin.

## 2016-01-08 NOTE — Patient Instructions (Addendum)
EKG today.  Ask to schedule with our coumadin clinic on next coumadin check.  Decrease metformin to 500mg  twice daily and let me know how you tolerate, and how sugars run. Labs today - we will be in touch with results.  Return in 4 months for follow up visit.   Health Maintenance, Male A healthy lifestyle and preventative care can promote health and wellness.  Maintain regular health, dental, and eye exams.  Eat a healthy diet. Foods like vegetables, fruits, whole grains, low-fat dairy products, and lean protein foods contain the nutrients you need and are low in calories. Decrease your intake of foods high in solid fats, added sugars, and salt. Get information about a proper diet from your health care provider, if necessary.  Regular physical exercise is one of the most important things you can do for your health. Most adults should get at least 150 minutes of moderate-intensity exercise (any activity that increases your heart rate and causes you to sweat) each week. In addition, most adults need muscle-strengthening exercises on 2 or more days a week.   Maintain a healthy weight. The body mass index (BMI) is a screening tool to identify possible weight problems. It provides an estimate of body fat based on height and weight. Your health care provider can find your BMI and can help you achieve or maintain a healthy weight. For males 20 years and older:  A BMI below 18.5 is considered underweight.  A BMI of 18.5 to 24.9 is normal.  A BMI of 25 to 29.9 is considered overweight.  A BMI of 30 and above is considered obese.  Maintain normal blood lipids and cholesterol by exercising and minimizing your intake of saturated fat. Eat a balanced diet with plenty of fruits and vegetables. Blood tests for lipids and cholesterol should begin at age 35 and be repeated every 5 years. If your lipid or cholesterol levels are high, you are over age 27, or you are at high risk for heart disease, you may  need your cholesterol levels checked more frequently.Ongoing high lipid and cholesterol levels should be treated with medicines if diet and exercise are not working.  If you smoke, find out from your health care provider how to quit. If you do not use tobacco, do not start.  Lung cancer screening is recommended for adults aged 34-80 years who are at high risk for developing lung cancer because of a history of smoking. A yearly low-dose CT scan of the lungs is recommended for people who have at least a 30-pack-year history of smoking and are current smokers or have quit within the past 15 years. A pack year of smoking is smoking an average of 1 pack of cigarettes a day for 1 year (for example, a 30-pack-year history of smoking could mean smoking 1 pack a day for 30 years or 2 packs a day for 15 years). Yearly screening should continue until the smoker has stopped smoking for at least 15 years. Yearly screening should be stopped for people who develop a health problem that would prevent them from having lung cancer treatment.  If you choose to drink alcohol, do not have more than 2 drinks per day. One drink is considered to be 12 oz (360 mL) of beer, 5 oz (150 mL) of wine, or 1.5 oz (45 mL) of liquor.  Avoid the use of street drugs. Do not share needles with anyone. Ask for help if you need support or instructions about stopping the use of  drugs.  High blood pressure causes heart disease and increases the risk of stroke. High blood pressure is more likely to develop in:  People who have blood pressure in the end of the normal range (100-139/85-89 mm Hg).  People who are overweight or obese.  People who are African American.  If you are 60-59 years of age, have your blood pressure checked every 3-5 years. If you are 73 years of age or older, have your blood pressure checked every year. You should have your blood pressure measured twice-once when you are at a hospital or clinic, and once when you are  not at a hospital or clinic. Record the average of the two measurements. To check your blood pressure when you are not at a hospital or clinic, you can use:  An automated blood pressure machine at a pharmacy.  A home blood pressure monitor.  If you are 18-28 years old, ask your health care provider if you should take aspirin to prevent heart disease.  Diabetes screening involves taking a blood sample to check your fasting blood sugar level. This should be done once every 3 years after age 13 if you are at a normal weight and without risk factors for diabetes. Testing should be considered at a younger age or be carried out more frequently if you are overweight and have at least 1 risk factor for diabetes.  Colorectal cancer can be detected and often prevented. Most routine colorectal cancer screening begins at the age of 37 and continues through age 64. However, your health care provider may recommend screening at an earlier age if you have risk factors for colon cancer. On a yearly basis, your health care provider may provide home test kits to check for hidden blood in the stool. A small camera at the end of a tube may be used to directly examine the colon (sigmoidoscopy or colonoscopy) to detect the earliest forms of colorectal cancer. Talk to your health care provider about this at age 12 when routine screening begins. A direct exam of the colon should be repeated every 5-10 years through age 69, unless early forms of precancerous polyps or small growths are found.  People who are at an increased risk for hepatitis B should be screened for this virus. You are considered at high risk for hepatitis B if:  You were born in a country where hepatitis B occurs often. Talk with your health care provider about which countries are considered high risk.  Your parents were born in a high-risk country and you have not received a shot to protect against hepatitis B (hepatitis B vaccine).  You have HIV or  AIDS.  You use needles to inject street drugs.  You live with, or have sex with, someone who has hepatitis B.  You are a man who has sex with other men (MSM).  You get hemodialysis treatment.  You take certain medicines for conditions like cancer, organ transplantation, and autoimmune conditions.  Hepatitis C blood testing is recommended for all people born from [birth year] through 1967 and any individual with known risk factors for hepatitis C.  Healthy men should no longer receive prostate-specific antigen (PSA) blood tests as part of routine cancer screening. Talk to your health care provider about prostate cancer screening.  Testicular cancer screening is not recommended for adolescents or adult males who have no symptoms. Screening includes self-exam, a health care provider exam, and other screening tests. Consult with your health care provider about any symptoms you have  or any concerns you have about testicular cancer.  Practice safe sex. Use condoms and avoid high-risk sexual practices to reduce the spread of sexually transmitted infections (STIs).  You should be screened for STIs, including gonorrhea and chlamydia if:  You are sexually active and are younger than 24 years.  You are older than 24 years, and your health care provider tells you that you are at risk for this type of infection.  Your sexual activity has changed since you were last screened, and you are at an increased risk for chlamydia or gonorrhea. Ask your health care provider if you are at risk.  If you are at risk of being infected with HIV, it is recommended that you take a prescription medicine daily to prevent HIV infection. This is called pre-exposure prophylaxis (PrEP). You are considered at risk if:  You are a man who has sex with other men (MSM).  You are a heterosexual man who is sexually active with multiple partners.  You take drugs by injection.  You are sexually active with a partner who has  HIV.  Talk with your health care provider about whether you are at high risk of being infected with HIV. If you choose to begin PrEP, you should first be tested for HIV. You should then be tested every 3 months for as long as you are taking PrEP.  Use sunscreen. Apply sunscreen liberally and repeatedly throughout the day. You should seek shade when your shadow is shorter than you. Protect yourself by wearing long sleeves, pants, a wide-brimmed hat, and sunglasses year round whenever you are outdoors.  Tell your health care provider of new moles or changes in moles, especially if there is a change in shape or color. Also, tell your health care provider if a mole is larger than the size of a pencil eraser.  A one-time screening for abdominal aortic aneurysm (AAA) and surgical repair of large AAAs by ultrasound is recommended for men aged 75-75 years who are current or former smokers.  Stay current with your vaccines (immunizations). This information is not intended to replace advice given to you by your health care provider. Make sure you discuss any questions you have with your health care provider. Document Released: 06/19/2007 Document Revised: 01/11/2014 Document Reviewed: 09/24/2014 Elsevier Interactive Patient Education  2017 [employer].

## 2016-01-08 NOTE — Assessment & Plan Note (Signed)
Preventative protocols reviewed and updated unless pt declined. Discussed healthy diet and lifestyle.  

## 2016-01-08 NOTE — Assessment & Plan Note (Signed)
Chronic, improved. Check A1c today.  Continued loose stools to current metformin dose - will decrease to 500mg  bid. Using glipizide PRN hyperglycemia- rarely. Reviewed insulin dosing.  Has implemented significant healthy diet and lifestyle changes.

## 2016-01-08 NOTE — Progress Notes (Signed)
Pre visit review using our clinic review tool, if applicable. No additional management support is needed unless otherwise documented below in the visit note. 

## 2016-01-08 NOTE — Assessment & Plan Note (Addendum)
Continue coumadin. 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

## 2016-01-08 NOTE — Progress Notes (Signed)
BP 122/82 (BP Location: Right Arm, Patient Position: Sitting, Cuff Size: Normal)   Pulse 65   Temp 98 F (36.7 C) (Oral)   Ht 5' 7.75" (1.721 m)   Wt 160 lb (72.6 kg)   SpO2 99%   BMI 24.51 kg/m    CC: CPE Subjective:    Patient ID: Evan Cabrera, male    DOB: 1960-09-02, 56 y.o.   MRN: [id number]  HPI: Evan Cabrera is a 56 y.o. male presenting on 01/08/2016 for Annual Exam (Discuss Coumadin and Cardiology)   See yesterday's patient email for log - reviewed. Checks TID AC and HS. Persistent impaired fasting sugar with overall well controlled cbg's during the day. Overall complaint with diabetic diet. He joined gym 2 wks ago - 1hr cardio and weight lifting 5d/wk. He did see nutritionist. Actually taking glipizide PRN hyperglycemia.   Preventative: COLONOSCOPY 12/2014 rpt 5 yrs [facility]) Prostate cancer screening - known BPH. Desires continued DRE/PSA.  Flu shot yearly Pneumovax 2017 Tdap 2010 Seat belt use discussed  Sunscreen use discussed. No changing moles on skin.  Non smoker  Alcohol - none   "Evan Cabrera" Lives with wife Sharyn Lull, 1 dog Grown children Occ: Pastor Exercise: Yes Cardio 62min 5x weekly  Diet: good water, daily fruits/vegetables, avoids red meats  Relevant past medical, surgical, family and social history reviewed and updated as indicated. Interim medical history since our last visit reviewed. Allergies and medications reviewed and updated. Current Outpatient Prescriptions on File Prior to Visit  Medication Sig  . ezetimibe-simvastatin (VYTORIN) 10-40 MG tablet Take 1 tablet by mouth at bedtime.  . folic acid (FOLVITE) [id number] MCG tablet Take 800 mcg by mouth daily.    Marland Kitchen glucose blood test strip 1 each by Other route 4 (four) times daily. Use to check sugar four times daily. Dx: E11.9 **One Touch Ultra**  . Insulin Pen Needle 31G X 5 MM MISC Use to inject insulin as directed  . JANTOVEN 5 MG tablet TAKE AS DIRECTED BY        COUMADIC CLINIC  . Multiple Vitamin  (MULTIVITAMIN) tablet Take 1 tablet by mouth daily.    . Omega-3 Fatty Acids (FISH OIL) 1200 MG CAPS Take 1,200 mg by mouth daily.    . vitamin C (ASCORBIC ACID) 500 MG tablet Take 500 mg by mouth daily.     No current facility-administered medications on file prior to visit.     Review of Systems  Constitutional: Negative for activity change, appetite change, chills, fatigue, fever and unexpected weight change.  HENT: Negative for hearing loss.   Eyes: Negative for visual disturbance.  Respiratory: Negative for cough, chest tightness, shortness of breath and wheezing.   Cardiovascular: Negative for chest pain, palpitations and leg swelling.  Gastrointestinal: Positive for diarrhea (metformin related loose stools). Negative for abdominal distention, abdominal pain, blood in stool, constipation, nausea and vomiting.  Genitourinary: Negative for difficulty urinating and hematuria.  Musculoskeletal: Negative for arthralgias, myalgias and neck pain.  Skin: Negative for rash.  Neurological: Negative for dizziness, seizures, syncope and headaches.  Hematological: Negative for adenopathy. Does not bruise/bleed easily.  Psychiatric/Behavioral: Negative for dysphoric mood. The patient is not nervous/anxious.    Per HPI unless specifically indicated in ROS section     Objective:    BP 122/82 (BP Location: Right Arm, Patient Position: Sitting, Cuff Size: Normal)   Pulse 65   Temp 98 F (36.7 C) (Oral)   Ht 5' 7.75" (1.721 m)   Wt 160 lb (72.6 kg)  SpO2 99%   BMI 24.51 kg/m   Wt Readings from Last 3 Encounters:  01/08/16 160 lb (72.6 kg)  11/26/15 163 lb (73.9 kg)  11/13/15 165 lb (74.8 kg)    Physical Exam  Constitutional: He is oriented to person, place, and time. He appears well-developed and well-nourished. No distress.  HENT:  Head: Normocephalic and atraumatic.  Right Ear: Hearing, tympanic membrane, external ear and ear canal normal.  Left Ear: Hearing, tympanic membrane,  external ear and ear canal normal.  Nose: Nose normal.  Mouth/Throat: Uvula is midline, oropharynx is clear and moist and mucous membranes are normal. No oropharyngeal exudate, posterior oropharyngeal edema or posterior oropharyngeal erythema.  Eyes: Conjunctivae and EOM are normal. Pupils are equal, round, and reactive to light. No scleral icterus.  Neck: Normal range of motion. Neck supple. Carotid bruit is not present. No thyromegaly present.  Cardiovascular: Normal rate, regular rhythm, normal heart sounds and intact distal pulses.   No murmur heard. Pulses:      Radial pulses are 2+ on the right side, and 2+ on the left side.  Pulmonary/Chest: Effort normal and breath sounds normal. No respiratory distress. He has no wheezes. He has no rales.  Abdominal: Soft. Bowel sounds are normal. He exhibits no distension and no mass. There is no tenderness. There is no rebound and no guarding.  Genitourinary: Prostate normal. Rectal exam shows external hemorrhoid (non inflamed) and internal hemorrhoid. Rectal exam shows no fissure, no mass, no tenderness and anal tone normal. Prostate is not enlarged (20gm) and not tender.  Musculoskeletal: Normal range of motion. He exhibits no edema.  Lymphadenopathy:    He has no cervical adenopathy.  Neurological: He is alert and oriented to person, place, and time.  CN grossly intact, station and gait intact  Skin: Skin is warm and dry. No rash noted.  Psychiatric: He has a normal mood and affect. His behavior is normal. Judgment and thought content normal.  Nursing note and vitals reviewed.  Results for orders placed or performed in visit on 12/23/15  POCT INR  Result Value Ref Range   INR 2.0       Assessment & Plan:   Problem List Items Addressed This Visit    Anticoagulant long-term use    Desires to establish with our coumadin clinic nurse [facility] - advised to schedule this up front.       BPH (benign prostatic hyperplasia)    Check today.         Diabetes mellitus type 2, uncontrolled, with complications (HCC)    Chronic, improved. Check A1c today.  Continued loose stools to current metformin dose - will decrease to 500mg  bid. Using glipizide PRN hyperglycemia- rarely. Reviewed insulin dosing.  Has implemented significant healthy diet and lifestyle changes.       Relevant Medications   Insulin Detemir (LEVEMIR FLEXTOUCH) 100 UNIT/ML Pen   glipiZIDE (GLUCOTROL) 5 MG tablet   metFORMIN (GLUCOPHAGE) 1000 MG tablet   Factor 5 Leiden mutation, heterozygous (HCC)    Continue coumadin. 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       HLD (hyperlipidemia)    Chronic, stable. Check FLP. Continue vytorin.       Routine general medical examination at a health care facility - Primary    Preventative protocols reviewed and updated unless pt declined. Discussed healthy diet and lifestyle.       Sleep apnea       Follow up plan: Return in about  4 months (around 05/07/2016) for follow up visit.  [name], MD

## 2016-02-03 ENCOUNTER — Ambulatory Visit (INDEPENDENT_AMBULATORY_CARE_PROVIDER_SITE_OTHER): Payer: Managed Care, Other (non HMO)

## 2016-02-03 ENCOUNTER — Ambulatory Visit: Payer: Managed Care, Other (non HMO)

## 2016-02-03 DIAGNOSIS — I82409 Acute embolism and thrombosis of unspecified deep veins of unspecified lower extremity: Secondary | ICD-10-CM | POA: Diagnosis not present

## 2016-02-03 DIAGNOSIS — Z7901 Long term (current) use of anticoagulants: Secondary | ICD-10-CM

## 2016-02-03 DIAGNOSIS — D6851 Activated protein C resistance: Secondary | ICD-10-CM

## 2016-02-03 DIAGNOSIS — I2699 Other pulmonary embolism without acute cor pulmonale: Secondary | ICD-10-CM | POA: Diagnosis not present

## 2016-02-03 DIAGNOSIS — D682 Hereditary deficiency of other clotting factors: Secondary | ICD-10-CM | POA: Diagnosis not present

## 2016-02-03 LAB — POCT INR: INR: 2.6

## 2016-02-03 NOTE — Patient Instructions (Signed)
Pre visit review using our clinic review tool, if applicable. No additional management support is needed unless otherwise documented below in the visit note. 

## 2016-02-03 NOTE — Addendum Note (Signed)
Addended by: Magdalen Spatz C on: 02/03/2016 09:44 AM   Modules accepted: Orders

## 2016-02-19 ENCOUNTER — Encounter: Payer: Self-pay | Admitting: Family Medicine

## 2016-02-23 NOTE — Telephone Encounter (Signed)
See pt email. Wanted to get your thoughts.  Thanks, Garlon Hatchet

## 2016-03-09 ENCOUNTER — Ambulatory Visit (INDEPENDENT_AMBULATORY_CARE_PROVIDER_SITE_OTHER): Payer: 59

## 2016-03-09 DIAGNOSIS — Z7901 Long term (current) use of anticoagulants: Secondary | ICD-10-CM

## 2016-03-09 DIAGNOSIS — D6851 Activated protein C resistance: Secondary | ICD-10-CM

## 2016-03-09 LAB — POCT INR: INR: 3.1

## 2016-03-09 NOTE — Patient Instructions (Signed)
Pre visit review using our clinic review tool, if applicable. No additional management support is needed unless otherwise documented below in the visit note. 

## 2016-03-09 NOTE — Telephone Encounter (Signed)
Reviewed patient concerns with him today at INR check. He is in range and due to consecutive in range readings, will recheck him again in 6-8weeks.  He is to call if any changes in meds, health or unusual bleeding or bruising develops in the interim.    Would like to discuss further with PCP regarding options including home meter use or lab visit versus coumadin clinic use given patient's special circumstances and health diagnoses.    Will discuss next week with Dr. Danise Mina once he returns to office.  Thanks.

## 2016-03-24 ENCOUNTER — Encounter: Payer: Self-pay | Admitting: Family Medicine

## 2016-03-25 NOTE — Telephone Encounter (Signed)
Spoke with [facility] about patient. Reliable pt , should do well with home checks. Ok to try home meter.

## 2016-03-31 NOTE — Telephone Encounter (Signed)
Spoke with patient via phone regarding transition to [employer] for self-INR checks.  [facility] rep Elta Guadeloupe) will be processing orders and checking with patient's insurance.  The out of pocket expense for the meter and strips is significantly higher than anticipated and may not be an option if insurance doesn't cover.  Out of Pocket Meter:  $2500.00  Out of Pocket Strips:  $20.00 PER STRIP  Patient aware of out of pocket expenses and is anxious to see if his [employer] will cover any of the expense.  Rep, Elta Guadeloupe, is to contact patient and our office after verifying insurance.  Also, spoke with patient about his request to increase green consumption to daily.  Patient aware that this will result in frequent INR checks until regulated.  Patient is to keep a food diary of current green amounts and come in for a baseline INR and 1:1 discussion with this writer to plan for green increase.  He verbalizes understanding and agreement.

## 2016-04-01 NOTE — Telephone Encounter (Signed)
Spoke with patient regarding home monitoring on 03/31/16.  (please refer to separate my chart message).  Mark with [employer] is to contact patient to discuss further.

## 2016-04-14 ENCOUNTER — Encounter: Payer: Self-pay | Admitting: Family Medicine

## 2016-04-20 ENCOUNTER — Ambulatory Visit (INDEPENDENT_AMBULATORY_CARE_PROVIDER_SITE_OTHER): Payer: 59

## 2016-04-20 DIAGNOSIS — Z7901 Long term (current) use of anticoagulants: Secondary | ICD-10-CM

## 2016-04-20 DIAGNOSIS — D6851 Activated protein C resistance: Secondary | ICD-10-CM

## 2016-04-20 LAB — POCT INR: INR: 1.6

## 2016-04-20 NOTE — Patient Instructions (Signed)
Pre visit review using our clinic review tool, if applicable. No additional management support is needed unless otherwise documented below in the visit note. 

## 2016-04-27 ENCOUNTER — Encounter: Payer: Self-pay | Admitting: Family Medicine

## 2016-04-27 ENCOUNTER — Ambulatory Visit (INDEPENDENT_AMBULATORY_CARE_PROVIDER_SITE_OTHER): Payer: 59

## 2016-04-27 DIAGNOSIS — Z7901 Long term (current) use of anticoagulants: Secondary | ICD-10-CM

## 2016-04-27 DIAGNOSIS — D6851 Activated protein C resistance: Secondary | ICD-10-CM

## 2016-04-27 LAB — POCT INR: INR: 2.1

## 2016-04-27 NOTE — Patient Instructions (Signed)
Pre visit review using our clinic review tool, if applicable. No additional management support is needed unless otherwise documented below in the visit note. 

## 2016-04-29 ENCOUNTER — Other Ambulatory Visit: Payer: Self-pay | Admitting: Family Medicine

## 2016-05-04 ENCOUNTER — Ambulatory Visit: Payer: 59

## 2016-05-04 LAB — POCT INR: INR: 3

## 2016-05-05 ENCOUNTER — Ambulatory Visit: Payer: Self-pay

## 2016-05-05 DIAGNOSIS — Z7901 Long term (current) use of anticoagulants: Secondary | ICD-10-CM

## 2016-05-05 DIAGNOSIS — D6851 Activated protein C resistance: Secondary | ICD-10-CM

## 2016-05-11 ENCOUNTER — Ambulatory Visit (INDEPENDENT_AMBULATORY_CARE_PROVIDER_SITE_OTHER): Payer: 59

## 2016-05-11 DIAGNOSIS — Z7901 Long term (current) use of anticoagulants: Secondary | ICD-10-CM

## 2016-05-11 DIAGNOSIS — D6851 Activated protein C resistance: Secondary | ICD-10-CM

## 2016-05-11 LAB — POCT INR: INR: 3

## 2016-05-11 NOTE — Patient Instructions (Signed)
Pre visit review using our clinic review tool, if applicable. No additional management support is needed unless otherwise documented below in the visit note. 

## 2016-05-12 ENCOUNTER — Ambulatory Visit: Payer: Managed Care, Other (non HMO) | Admitting: Family Medicine

## 2016-05-18 ENCOUNTER — Ambulatory Visit (INDEPENDENT_AMBULATORY_CARE_PROVIDER_SITE_OTHER): Payer: 59

## 2016-05-18 DIAGNOSIS — Z7901 Long term (current) use of anticoagulants: Secondary | ICD-10-CM

## 2016-05-18 DIAGNOSIS — D6851 Activated protein C resistance: Secondary | ICD-10-CM

## 2016-05-18 LAB — POCT INR: INR: 2.8

## 2016-05-18 NOTE — Patient Instructions (Signed)
Pre visit review using our clinic review tool, if applicable. No additional management support is needed unless otherwise documented below in the visit note. 

## 2016-05-26 ENCOUNTER — Ambulatory Visit (INDEPENDENT_AMBULATORY_CARE_PROVIDER_SITE_OTHER): Payer: 59

## 2016-05-26 DIAGNOSIS — Z7901 Long term (current) use of anticoagulants: Secondary | ICD-10-CM

## 2016-05-26 DIAGNOSIS — D6851 Activated protein C resistance: Secondary | ICD-10-CM

## 2016-05-26 LAB — POCT INR: INR: 3

## 2016-05-26 NOTE — Patient Instructions (Signed)
Pre visit review using our clinic review tool, if applicable. No additional management support is needed unless otherwise documented below in the visit note. 

## 2016-06-09 ENCOUNTER — Encounter: Payer: Self-pay | Admitting: Family Medicine

## 2016-06-09 LAB — POCT INR: INR: 2.5

## 2016-06-10 ENCOUNTER — Ambulatory Visit (INDEPENDENT_AMBULATORY_CARE_PROVIDER_SITE_OTHER): Payer: 59

## 2016-06-10 DIAGNOSIS — D6851 Activated protein C resistance: Secondary | ICD-10-CM

## 2016-06-10 DIAGNOSIS — Z7901 Long term (current) use of anticoagulants: Secondary | ICD-10-CM

## 2016-06-10 LAB — POCT INR: INR: 2.5

## 2016-06-10 NOTE — Patient Instructions (Signed)
Pre visit review using our clinic review tool, if applicable. No additional management support is needed unless otherwise documented below in the visit note. 

## 2016-06-23 ENCOUNTER — Ambulatory Visit (INDEPENDENT_AMBULATORY_CARE_PROVIDER_SITE_OTHER): Payer: 59

## 2016-06-23 ENCOUNTER — Telehealth: Payer: Self-pay

## 2016-06-23 DIAGNOSIS — D6851 Activated protein C resistance: Secondary | ICD-10-CM

## 2016-06-23 DIAGNOSIS — Z7901 Long term (current) use of anticoagulants: Secondary | ICD-10-CM

## 2016-06-23 LAB — POCT INR: INR: 2.4

## 2016-06-23 NOTE — Patient Instructions (Signed)
Pre visit review using our clinic review tool, if applicable. No additional management support is needed unless otherwise documented below in the visit note. 

## 2016-06-23 NOTE — Telephone Encounter (Signed)
Left detailed message (okay per DPR) on patient's cell number with coumadin instructions:  Take 10mg  today for a boost and then resume 7.5mg  daily.  Recheck on either the 27th or 28th.  Patient to call back if any concerns.

## 2016-06-24 ENCOUNTER — Telehealth: Payer: Self-pay | Admitting: Family Medicine

## 2016-06-24 ENCOUNTER — Ambulatory Visit (INDEPENDENT_AMBULATORY_CARE_PROVIDER_SITE_OTHER): Payer: 59 | Admitting: *Deleted

## 2016-06-24 DIAGNOSIS — Z23 Encounter for immunization: Secondary | ICD-10-CM

## 2016-06-24 NOTE — Telephone Encounter (Signed)
Pt called wanting to know last tentus vaccine it was 2010.  Pt stated he hit his elbow on rusty nail it did bring blood.  He wanted to know if he needs to get another tentus shot.

## 2016-06-24 NOTE — Telephone Encounter (Signed)
Yeah let's do another one - Td only - may come in for nurse visit.

## 2016-06-24 NOTE — Telephone Encounter (Signed)
Spoke with patient. He will be coming in today for TD

## 2016-06-29 ENCOUNTER — Encounter: Payer: Self-pay | Admitting: Family Medicine

## 2016-06-30 ENCOUNTER — Ambulatory Visit (INDEPENDENT_AMBULATORY_CARE_PROVIDER_SITE_OTHER): Payer: 59

## 2016-06-30 DIAGNOSIS — D6851 Activated protein C resistance: Secondary | ICD-10-CM

## 2016-06-30 DIAGNOSIS — Z7901 Long term (current) use of anticoagulants: Secondary | ICD-10-CM

## 2016-06-30 LAB — POCT INR: INR: 2.3

## 2016-06-30 NOTE — Patient Instructions (Signed)
Pre visit review using our clinic review tool, if applicable. No additional management support is needed unless otherwise documented below in the visit note. 

## 2016-07-03 MED ORDER — WARFARIN SODIUM 5 MG PO TABS: ORAL_TABLET | ORAL | 1 refills | Status: DC

## 2016-07-08 ENCOUNTER — Encounter: Payer: Self-pay | Admitting: Family Medicine

## 2016-07-08 ENCOUNTER — Telehealth: Payer: Self-pay | Admitting: *Deleted

## 2016-07-08 LAB — PROTIME-INR

## 2016-07-08 NOTE — Telephone Encounter (Addendum)
plz notify INR at goal. Continue current regimen (7.5mg  daily, 10mg  one day a week ?wednesday), recheck in 2 wks and call us with results.

## 2016-07-08 NOTE — Telephone Encounter (Signed)
Pt notified of instructions, and he verbalized understanding. He states his dose was changed last week to 7.5mg  daily, except 10mg  on Wed and Fri. He will have rechecked in 2 weeks and call with result.

## 2016-07-08 NOTE — Telephone Encounter (Signed)
Pt left msg on [facility] for [facility] w/Dr. Danise Mina was told to call bck w/his INR results, which was 2.6 Msg left at wrong office will forward to [facility] coumadin clinic....Johny Chess

## 2016-07-13 NOTE — Telephone Encounter (Signed)
Noted.  Thanks!  Patient is to be on 7.5mg  daily except for 10mg  on Wednesdays and Fridays.  Will look for his recheck on 07/21/16.

## 2016-07-16 ENCOUNTER — Telehealth: Payer: Self-pay

## 2016-07-16 NOTE — Telephone Encounter (Signed)
Evan Cabrera from [place] home monitoring with [employer] requesting to change INR testing frequency to 2 - 4 times per month for anticoagulation monitoring. Evan Cabrera request cb.

## 2016-07-20 NOTE — Telephone Encounter (Signed)
Notified [facility] ([employer]) home monitoring ok to increase patient testing frequency to every 2-4 weeks.  Patient is stable on coumadin management regimen.

## 2016-07-22 ENCOUNTER — Ambulatory Visit (INDEPENDENT_AMBULATORY_CARE_PROVIDER_SITE_OTHER): Payer: 59

## 2016-07-22 DIAGNOSIS — D6851 Activated protein C resistance: Secondary | ICD-10-CM | POA: Diagnosis not present

## 2016-07-22 DIAGNOSIS — Z7901 Long term (current) use of anticoagulants: Secondary | ICD-10-CM

## 2016-07-22 LAB — POCT INR: INR: 2.7

## 2016-07-22 NOTE — Patient Instructions (Signed)
Pre visit review using our clinic review tool, if applicable. No additional management support is needed unless otherwise documented below in the visit note. 

## 2016-08-19 ENCOUNTER — Ambulatory Visit: Payer: Self-pay

## 2016-08-19 DIAGNOSIS — D6851 Activated protein C resistance: Secondary | ICD-10-CM

## 2016-08-19 DIAGNOSIS — Z7901 Long term (current) use of anticoagulants: Secondary | ICD-10-CM

## 2016-08-19 LAB — POCT INR: INR: 2.8

## 2016-08-19 NOTE — Patient Instructions (Signed)
Pre visit review using our clinic review tool, if applicable. No additional management support is needed unless otherwise documented below in the visit note. 

## 2016-09-16 ENCOUNTER — Ambulatory Visit: Payer: Self-pay

## 2016-09-16 DIAGNOSIS — Z7901 Long term (current) use of anticoagulants: Secondary | ICD-10-CM

## 2016-09-16 DIAGNOSIS — D6851 Activated protein C resistance: Secondary | ICD-10-CM

## 2016-09-16 LAB — POCT INR: INR: 2.8

## 2016-09-16 NOTE — Patient Instructions (Signed)
Pre visit review using our clinic review tool, if applicable. No additional management support is needed unless otherwise documented below in the visit note. 

## 2016-09-30 ENCOUNTER — Other Ambulatory Visit: Payer: Self-pay | Admitting: Family Medicine

## 2016-10-06 ENCOUNTER — Telehealth: Payer: Self-pay

## 2016-10-06 NOTE — Telephone Encounter (Signed)
Notified patient that I will be out of town next week and coumadin clinic will not be open.  He is on home monitoring and has been doing very well.  Will have him postpone reading for 1 week as he is very stable (and recheck INR on 10/18).  Patient is aware that if any changes or concerns he can reach the doctor here if needed.

## 2016-10-21 LAB — POCT INR: INR: 3.7

## 2016-10-22 ENCOUNTER — Ambulatory Visit: Payer: Self-pay

## 2016-10-22 DIAGNOSIS — D6851 Activated protein C resistance: Secondary | ICD-10-CM

## 2016-10-22 DIAGNOSIS — Z7901 Long term (current) use of anticoagulants: Secondary | ICD-10-CM

## 2016-10-22 NOTE — Patient Instructions (Signed)
Pre visit review using our clinic review tool, if applicable. No additional management support is needed unless otherwise documented below in the visit note. 

## 2016-10-27 ENCOUNTER — Encounter: Payer: Self-pay | Admitting: Family Medicine

## 2016-10-27 MED ORDER — GLUCOSE BLOOD VI STRP: 1.0000 | ORAL_STRIP | Freq: Four times a day (QID) | 2 refills | Status: DC

## 2016-11-03 ENCOUNTER — Ambulatory Visit (INDEPENDENT_AMBULATORY_CARE_PROVIDER_SITE_OTHER): Payer: 59 | Admitting: Family Medicine

## 2016-11-03 ENCOUNTER — Encounter: Payer: Self-pay | Admitting: Family Medicine

## 2016-11-03 VITALS — BP 118/64 | HR 62 | Temp 98.1°F | Wt 160.0 lb

## 2016-11-03 DIAGNOSIS — D6851 Activated protein C resistance: Secondary | ICD-10-CM

## 2016-11-03 DIAGNOSIS — E118 Type 2 diabetes mellitus with unspecified complications: Secondary | ICD-10-CM

## 2016-11-03 DIAGNOSIS — Z23 Encounter for immunization: Secondary | ICD-10-CM | POA: Diagnosis not present

## 2016-11-03 DIAGNOSIS — E1165 Type 2 diabetes mellitus with hyperglycemia: Secondary | ICD-10-CM

## 2016-11-03 DIAGNOSIS — IMO0002 Reserved for concepts with insufficient information to code with codable children: Secondary | ICD-10-CM

## 2016-11-03 LAB — BASIC METABOLIC PANEL
BUN: 19 mg/dL (ref 6–23)
CHLORIDE: 100 meq/L (ref 96–112)
CO2: 32 meq/L (ref 19–32)
Calcium: 9.7 mg/dL (ref 8.4–10.5)
Creatinine, Ser: 1.03 mg/dL (ref 0.40–1.50)
GFR: 78.74 mL/min (ref >60.00)
GLUCOSE: 243 mg/dL — AB (ref 70–99)
POTASSIUM: 4.4 meq/L (ref 3.5–5.1)
SODIUM: 138 meq/L (ref 135–145)

## 2016-11-03 LAB — HEMOGLOBIN A1C: Hgb A1c MFr Bld: 8.4 % — ABNORMAL HIGH (ref 4.6–6.5)

## 2016-11-03 NOTE — Assessment & Plan Note (Signed)
Continue coumadin with home alere monitor, managed by our coumadin clinic.

## 2016-11-03 NOTE — Progress Notes (Signed)
BP 118/64 (BP Location: Left Arm, Patient Position: Sitting, Cuff Size: Normal)   Pulse 62   Temp 98.1 F (36.7 C) (Oral)   Wt 160 lb (72.6 kg)   SpO2 98%   BMI 24.51 kg/m    CC: diabetes f/u  Subjective:    Patient ID: Evan Cabrera, male    DOB: [birth year], 58 y.o.   MRN: [id number]  HPI: Evan Cabrera is a 56 y.o. male presenting on 11/03/2016 for Blood sugar control   Stressful last few months. Father passed away on 04/07/22. Wife's brother battling cancer, wife's mother had surgery, mother had surgery. Has noticed sugar elevation since early 09/2016.   Following greens diet - vegetables daily. Following low sugar low carb diet. Stays very active.   DM - does regularly check sugars QID averaging >250. Brings log which was reviewed: fasting averaging 200, pre lunch 160-200s, predinner 140-200s, bedtime 200-300s. Compliant with antihyperglycemic regimen which includes: levemir 16 u daily, metformin 500mg  bid, glipizide 5mg  QHS PRN hyperglycemia (takes if PM sugars >300). He has also increased metformin to 1000mg  bid and levemir to 20u this past week. He did have a low of 45 6 hours after taking glipizide when sugar was 280. Denies paresthesias or blurry vision at this time. Last diabetic eye exam DUE. Pneumovax: 2017. Prevnar: not due. Glucometer brand: Relion. DSME: 11/2015. Lab Results  Component Value Date   HGBA1C 6.6 (H) 01/08/2016   Diabetic Foot Exam - Simple   Simple Foot Form Diabetic Foot exam was performed with the following findings:  Yes 11/03/2016  2:12 PM  Visual Inspection No deformities, no ulcerations, no other skin breakdown bilaterally:  Yes Sensation Testing Intact to touch and monofilament testing bilaterally:  Yes Pulse Check Posterior Tibialis and Dorsalis pulse intact bilaterally:  Yes Comments Slight dystrophic R great toenail    Lab Results  Component Value Date   MICROALBUR <0.7 09/15/2015     Denies fevers/chills, abd pain, skin infections,  cough, URI sxs.   Relevant past medical, surgical, family and social history reviewed and updated as indicated. Interim medical history since our last visit reviewed. Allergies and medications reviewed and updated. Outpatient Medications Prior to Visit  Medication Sig Dispense Refill  . ezetimibe-simvastatin (VYTORIN) 10-40 MG tablet TAKE 1 TABLET AT BEDTIME 90 tablet 0  . folic acid (FOLVITE) 542 MCG tablet Take 800 mcg by mouth daily.      Marland Kitchen glipiZIDE (GLUCOTROL) 5 MG tablet Take 1 tablet (5 mg total) by mouth daily as needed (hyperglycemia).    Marland Kitchen glucose blood test strip 1 each by Other route 4 (four) times daily. Use to check sugar four times daily. Dx: E11.9 **One Touch Ultra** 120 each 2  . Insulin Pen Needle 31G X 5 MM MISC Use to inject insulin as directed 100 each 3  . Multiple Vitamin (MULTIVITAMIN) tablet Take 1 tablet by mouth daily.      . Omega-3 Fatty Acids (FISH OIL) 1200 MG CAPS Take 1,200 mg by mouth daily.      . vitamin C (ASCORBIC ACID) 500 MG tablet Take 500 mg by mouth daily.      Marland Kitchen warfarin (JANTOVEN) 5 MG tablet Take as directed by coumadin clinic 180 tablet 1  . ezetimibe-simvastatin (VYTORIN) 10-40 MG tablet TAKE 1 TABLET AT BEDTIME 90 tablet 1  . Insulin Detemir (LEVEMIR FLEXTOUCH) 100 UNIT/ML Pen Inject 16 Units into the skin at bedtime.    . metFORMIN (GLUCOPHAGE) 1000 MG tablet Take 0.5 tablets (500  mg total) by mouth 2 (two) times daily with a meal.    . metFORMIN (GLUCOPHAGE) 1000 MG tablet TAKE 1/2 TABLET (500MG ) IN THE MORNING AND 1 TABLET INTHE EVENING 135 tablet 3   No facility-administered medications prior to visit.     Past Medical History:  Diagnosis Date  . Benign prostatic hypertrophy history  . Depression 2005  . DVT (deep venous thrombosis) ([place]) 2005  . 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 Factor 5 Leiden mutation, heterozygous ([place])   . GERD (gastroesophageal reflux disease)   . Hyperlipemia   . Pulmonary embolism ([place]) [id number]  . Torn ACL  2008   right - no surgery due to FVL  . Type 2 diabetes mellitus ([place])     Past Surgical History:  Procedure Laterality Date  . COLONOSCOPY  10/2011   Dr Glennon Hamilton  . COLONOSCOPY  12/2014   rpt 5 yrs Glennon Hamilton)  . KNEE SURGERY Right 2003   for cartilage  . VASECTOMY  1988  . WISDOM TOOTH EXTRACTION      Family History  Problem Relation Age of Onset  . Pulmonary embolism Maternal Grandfather   . Heart attack Father 6       CBAG X1  . Deep vein thrombosis Father   . Diabetes Father   . Uterine cancer Mother   . Hypertension Mother   . Hyperlipidemia Mother   . Heart disease Mother        3 vessel CBAG @ 65  . Diabetes Brother   . Heart attack Brother 66       Diabetic  . Stroke Neg Hx     Social History  Substance Use Topics  . Smoking status: Never Smoker  . Smokeless tobacco: Never Used  . Alcohol use No    Per HPI unless specifically indicated in ROS section below Review of Systems     Objective:    BP 118/64 (BP Location: Left Arm, Patient Position: Sitting, Cuff Size: Normal)   Pulse 62   Temp 98.1 F (36.7 C) (Oral)   Wt 160 lb (72.6 kg)   SpO2 98%   BMI 24.51 kg/m   Wt Readings from Last 3 Encounters:  11/03/16 160 lb (72.6 kg)  01/08/16 160 lb (72.6 kg)  11/26/15 163 lb (73.9 kg)    Physical Exam  Constitutional: He appears well-developed and well-nourished. No distress.  HENT:  Head: Normocephalic and atraumatic.  Right Ear: External ear normal.  Left Ear: External ear normal.  Nose: Nose normal.  Mouth/Throat: Oropharynx is clear and moist. No oropharyngeal exudate.  Eyes: Pupils are equal, round, and reactive to light. Conjunctivae and EOM are normal. No scleral icterus.  Neck: Normal range of motion. Neck supple.  Cardiovascular: Normal rate, regular rhythm, normal heart sounds and intact distal pulses.   No murmur heard. Pulmonary/Chest: Effort normal and breath sounds normal. No respiratory distress. He has no wheezes. He has no rales.    Musculoskeletal: He exhibits no edema.  See HPI for foot exam if done  Lymphadenopathy:    He has no cervical adenopathy.  Skin: Skin is warm and dry. No rash noted.  Psychiatric: He has a normal mood and affect.  Nursing note and vitals reviewed.  Results for orders placed or performed in visit on 10/22/16  POCT INR  Result Value Ref Range   INR 3.7       Assessment & Plan:   Problem List Items Addressed This Visit    Diabetes mellitus type  2, uncontrolled, with complications ([place]) - Primary    Chronic and deteriorated. Brittle diabetes based on glucose log he brings as well as h/o quick hypoglycemia with glipizide use in the past. Worsening control over the past 3 months without cause found - ?stress related. Discussed increased metformin and slow titration of levemir to 25 units nightly. Will also refer to endocrinology for assistance. Pt requests endo in [facility] he is familiar with (took care of his father). Referral placed.  I encouraged he schedule eye exam as due.  BMP, A1c today.       Relevant Medications   metFORMIN (GLUCOPHAGE) 1000 MG tablet   Insulin Detemir (LEVEMIR FLEXTOUCH) 100 UNIT/ML Pen   Other Relevant Orders   Hemoglobin G6Y   Basic metabolic panel   Ambulatory referral to Endocrinology   Factor 5 Leiden mutation, heterozygous [facility])    Continue coumadin with home alere monitor, managed by our coumadin clinic.        Other Visit Diagnoses    Need for immunization against influenza       Relevant Orders   Flu Vaccine QUAD 6+ mos PF IM (Fluarix Quad PF)       Follow up plan: Return if symptoms worsen or fail to improve.  [name], MD

## 2016-11-03 NOTE — Patient Instructions (Addendum)
I agree with increased metformin 1000mg  twice daily as long as tolerated - this is long acting medicine and we will notice effect over months.  I agree with slow titration of levemir by 1 unit every 3 days if average fasting sugar >150 to max 25 units.  Let's refer you to diabetes doctor for evaluation as well - Dr Cheral Almas in [facility].  Labs today.

## 2016-11-03 NOTE — Assessment & Plan Note (Addendum)
Chronic and deteriorated. Brittle diabetes based on glucose log he brings as well as h/o quick hypoglycemia with glipizide use in the past. Worsening control over the past 3 months without cause found - ?stress related. Discussed increased metformin and slow titration of levemir to 25 units nightly. Will also refer to endocrinology for assistance. Pt requests endo in [facility] he is familiar with (took care of his father). Referral placed.  I encouraged he schedule eye exam as due.  BMP, A1c today.

## 2016-11-04 ENCOUNTER — Ambulatory Visit: Payer: Self-pay

## 2016-11-04 DIAGNOSIS — D6851 Activated protein C resistance: Secondary | ICD-10-CM

## 2016-11-04 DIAGNOSIS — Z7901 Long term (current) use of anticoagulants: Secondary | ICD-10-CM

## 2016-11-04 LAB — POCT INR: INR: 2.5

## 2016-11-04 NOTE — Patient Instructions (Signed)
Pre visit review using our clinic review tool, if applicable. No additional management support is needed unless otherwise documented below in the visit note. 

## 2016-11-07 NOTE — Progress Notes (Signed)
Agreed, thanks

## 2016-11-08 ENCOUNTER — Encounter: Payer: Self-pay | Admitting: Family Medicine

## 2016-11-29 ENCOUNTER — Telehealth: Payer: Self-pay | Admitting: Family Medicine

## 2016-11-29 ENCOUNTER — Encounter: Payer: Self-pay | Admitting: Family Medicine

## 2016-11-29 ENCOUNTER — Telehealth: Payer: Self-pay

## 2016-11-29 ENCOUNTER — Other Ambulatory Visit: Payer: Self-pay | Admitting: Family Medicine

## 2016-11-29 MED ORDER — INSULIN PEN NEEDLE 31G X 5 MM MISC: 3 refills | Status: DC

## 2016-11-29 MED ORDER — GLUCOSE BLOOD VI STRP: 1.0000 | ORAL_STRIP | Freq: Four times a day (QID) | 2 refills | Status: DC

## 2016-11-29 NOTE — Telephone Encounter (Signed)
Copied from [place]. Topic: Quick Communication - See Telephone Encounter >> Nov 29, 2016 10:29 AM Synthia Innocent wrote: CRM for notification. See Telephone encounter for:  Patients insurance is changing, will not cover current meter and test strips for coumadin. Would like a call back 11/29/16.

## 2016-11-29 NOTE — Telephone Encounter (Signed)
Attempted to call patient regarding his insurance. Message left for pt to call us back.

## 2016-12-01 NOTE — Telephone Encounter (Signed)
Done

## 2016-12-02 ENCOUNTER — Ambulatory Visit: Payer: Self-pay

## 2016-12-02 DIAGNOSIS — D6851 Activated protein C resistance: Secondary | ICD-10-CM

## 2016-12-02 DIAGNOSIS — Z7901 Long term (current) use of anticoagulants: Secondary | ICD-10-CM

## 2016-12-02 LAB — POCT INR: INR: 2.6

## 2016-12-06 ENCOUNTER — Encounter: Payer: Self-pay | Admitting: Family Medicine

## 2016-12-06 DIAGNOSIS — IMO0002 Reserved for concepts with insufficient information to code with codable children: Secondary | ICD-10-CM

## 2016-12-06 DIAGNOSIS — E1165 Type 2 diabetes mellitus with hyperglycemia: Secondary | ICD-10-CM

## 2016-12-06 DIAGNOSIS — E118 Type 2 diabetes mellitus with unspecified complications: Principal | ICD-10-CM

## 2016-12-06 NOTE — Telephone Encounter (Signed)
New endo referral placed

## 2016-12-08 ENCOUNTER — Telehealth: Payer: Self-pay | Admitting: Family Medicine

## 2016-12-08 NOTE — Telephone Encounter (Signed)
Addressed.  Thanks.

## 2016-12-08 NOTE — Telephone Encounter (Signed)
-----   Message from [facility] sent at 12/08/2016  8:41 AM EST ----- Yes, Referral was faxed to Dr Porfirio Oar office . Rosaria Ferries  ----- Message ----- From: Tonia Ghent, MD Sent: 11/11/2016   7:56 AM To: Haynes Bast  See mychart message.  Is this endo referral addressed?  Thanks.

## 2016-12-10 ENCOUNTER — Encounter: Payer: Self-pay | Admitting: Family Medicine

## 2016-12-10 DIAGNOSIS — E1165 Type 2 diabetes mellitus with hyperglycemia: Secondary | ICD-10-CM

## 2016-12-10 DIAGNOSIS — E118 Type 2 diabetes mellitus with unspecified complications: Principal | ICD-10-CM

## 2016-12-10 DIAGNOSIS — R1013 Epigastric pain: Secondary | ICD-10-CM

## 2016-12-10 DIAGNOSIS — IMO0002 Reserved for concepts with insufficient information to code with codable children: Secondary | ICD-10-CM

## 2016-12-16 ENCOUNTER — Other Ambulatory Visit (INDEPENDENT_AMBULATORY_CARE_PROVIDER_SITE_OTHER): Payer: 59

## 2016-12-16 DIAGNOSIS — R1013 Epigastric pain: Secondary | ICD-10-CM

## 2016-12-16 DIAGNOSIS — E1165 Type 2 diabetes mellitus with hyperglycemia: Secondary | ICD-10-CM

## 2016-12-16 DIAGNOSIS — IMO0002 Reserved for concepts with insufficient information to code with codable children: Secondary | ICD-10-CM

## 2016-12-16 DIAGNOSIS — E118 Type 2 diabetes mellitus with unspecified complications: Secondary | ICD-10-CM

## 2016-12-16 LAB — CBC WITH DIFFERENTIAL/PLATELET
BASOS ABS: 0 10*3/uL (ref 0.0–0.1)
Basophils Relative: 0.4 % (ref 0.0–3.0)
EOS ABS: 0.1 10*3/uL (ref 0.0–0.7)
Eosinophils Relative: 1.5 % (ref 0.0–5.0)
HEMATOCRIT: 38.1 % — AB (ref 39.0–52.0)
HEMOGLOBIN: 12.7 g/dL — AB (ref 13.0–17.0)
LYMPHS PCT: 22.9 % (ref 12.0–46.0)
Lymphs Abs: 1.1 10*3/uL (ref 0.7–4.0)
MCHC: 33.2 g/dL (ref 30.0–36.0)
MCV: 90 fl (ref 78.0–100.0)
MONO ABS: 0.4 10*3/uL (ref 0.1–1.0)
Monocytes Relative: 7.3 % (ref 3.0–12.0)
Neutro Abs: 3.4 10*3/uL (ref 1.4–7.7)
Neutrophils Relative %: 67.9 % (ref 43.0–77.0)
Platelets: 141 10*3/uL — ABNORMAL LOW (ref 150.0–400.0)
RBC: 4.24 Mil/uL (ref 4.22–5.81)
RDW: 13.5 % (ref 11.5–15.5)
WBC: 4.9 10*3/uL (ref 4.0–10.5)

## 2016-12-16 LAB — COMPREHENSIVE METABOLIC PANEL
ALT: 22 U/L (ref 0–53)
AST: 30 U/L (ref 0–37)
Albumin: 4.2 g/dL (ref 3.5–5.2)
Alkaline Phosphatase: 58 U/L (ref 39–117)
BILIRUBIN TOTAL: 0.5 mg/dL (ref 0.2–1.2)
BUN: 13 mg/dL (ref 6–23)
CALCIUM: 8.8 mg/dL (ref 8.4–10.5)
CHLORIDE: 102 meq/L (ref 96–112)
CO2: 32 meq/L (ref 19–32)
Creatinine, Ser: 0.96 mg/dL (ref 0.40–1.50)
GFR: 85.36 mL/min (ref >60.00)
GLUCOSE: 113 mg/dL — AB (ref 70–99)
Potassium: 4.5 mEq/L (ref 3.5–5.1)
Sodium: 139 mEq/L (ref 135–145)
Total Protein: 7 g/dL (ref 6.0–8.3)

## 2016-12-16 LAB — LIPID PANEL
CHOL/HDL RATIO: 2
Cholesterol: 110 mg/dL (ref 0–200)
HDL: 44.3 mg/dL (ref >39.00)
LDL CALC: 53 mg/dL (ref 0–99)
NONHDL: 65.97
TRIGLYCERIDES: 64 mg/dL (ref 0.0–149.0)
VLDL: 12.8 mg/dL (ref 0.0–40.0)

## 2016-12-16 LAB — LIPASE: LIPASE: 43 U/L (ref 11.0–59.0)

## 2016-12-19 LAB — FRUCTOSAMINE: FRUCTOSAMINE: 317 umol/L — AB (ref 190–270)

## 2016-12-20 ENCOUNTER — Encounter: Payer: Self-pay | Admitting: Family Medicine

## 2016-12-20 DIAGNOSIS — R1013 Epigastric pain: Secondary | ICD-10-CM

## 2016-12-20 NOTE — Telephone Encounter (Signed)
CT scan ordered for worsening abdominal pain and new anemia.

## 2016-12-24 ENCOUNTER — Ambulatory Visit (INDEPENDENT_AMBULATORY_CARE_PROVIDER_SITE_OTHER)
Admission: RE | Admit: 2016-12-24 | Discharge: 2016-12-24 | Disposition: A | Discharge status: Home / Self Care | Payer: 59 | Source: Ambulatory Visit | Attending: Family Medicine | Admitting: Family Medicine

## 2016-12-24 ENCOUNTER — Ambulatory Visit: Payer: Self-pay

## 2016-12-24 ENCOUNTER — Telehealth: Payer: Self-pay | Admitting: Family Medicine

## 2016-12-24 DIAGNOSIS — D6851 Activated protein C resistance: Secondary | ICD-10-CM

## 2016-12-24 DIAGNOSIS — Z7901 Long term (current) use of anticoagulants: Secondary | ICD-10-CM

## 2016-12-24 DIAGNOSIS — R1013 Epigastric pain: Secondary | ICD-10-CM | POA: Diagnosis not present

## 2016-12-24 DIAGNOSIS — C787 Secondary malignant neoplasm of liver and intrahepatic bile duct: Principal | ICD-10-CM

## 2016-12-24 DIAGNOSIS — C259 Malignant neoplasm of pancreas, unspecified: Secondary | ICD-10-CM

## 2016-12-24 LAB — POCT INR: INR: 3.4

## 2016-12-24 MED ORDER — IOPAMIDOL (ISOVUE-300) INJECTION 61%
100.0000 mL | Freq: Once | INTRAVENOUS | Status: AC | PRN
  Administered 2016-12-24: 100 mL via INTRAVENOUS

## 2016-12-24 NOTE — Patient Instructions (Signed)
Received fax report from [place].  INR 3.4 (12/23/16)  *Patient reports new dx likely of Type 1 Diabetes and has started on insulin since last check from endocrinologist.  In addition, he will be looking at cutting back off of his high Vit K diet but will do so slowly with my carefully tracking of INR and coumadin dose reduction.      For now,  Continue taking 1.5 pills (7.5mg ) daily EXCEPT for 2 pills (10mg ) on Wednesday and Friday. But, will start back on weekly checks due to current instability in diet and medication regimen.     Patient notified via phone with above instructions and verbalizes understanding.  Next check on 01/05/17 due to the holidays.

## 2016-12-24 NOTE — Telephone Encounter (Signed)
Received CT results and called patient, discussed findings. Urgent referral to oncology placed today.  I asked pt to come in Monday 9am to discuss.

## 2016-12-27 ENCOUNTER — Encounter: Payer: Self-pay | Admitting: Family Medicine

## 2016-12-27 ENCOUNTER — Telehealth: Payer: Self-pay | Admitting: Oncology

## 2016-12-27 ENCOUNTER — Ambulatory Visit: Payer: 59

## 2016-12-27 ENCOUNTER — Ambulatory Visit (INDEPENDENT_AMBULATORY_CARE_PROVIDER_SITE_OTHER): Payer: 59 | Admitting: Family Medicine

## 2016-12-27 VITALS — BP 130/78 | HR 68 | Temp 98.2°F | Wt 155.0 lb

## 2016-12-27 DIAGNOSIS — K8689 Other specified diseases of pancreas: Secondary | ICD-10-CM

## 2016-12-27 DIAGNOSIS — R160 Hepatomegaly, not elsewhere classified: Secondary | ICD-10-CM | POA: Diagnosis not present

## 2016-12-27 DIAGNOSIS — E785 Hyperlipidemia, unspecified: Secondary | ICD-10-CM

## 2016-12-27 DIAGNOSIS — K869 Disease of pancreas, unspecified: Secondary | ICD-10-CM

## 2016-12-27 DIAGNOSIS — D6851 Activated protein C resistance: Secondary | ICD-10-CM | POA: Diagnosis not present

## 2016-12-27 MED ORDER — ONDANSETRON HCL 4 MG PO TABS: 4.0000 mg | ORAL_TABLET | Freq: Two times a day (BID) | ORAL | 0 refills | Status: DC | PRN

## 2016-12-27 MED ORDER — HYDROCODONE-ACETAMINOPHEN 5-325 MG PO TABS: 0.5000 | ORAL_TABLET | Freq: Three times a day (TID) | ORAL | 0 refills | Status: DC | PRN

## 2016-12-27 NOTE — Progress Notes (Signed)
BP 130/78 (BP Location: Left Arm, Patient Position: Sitting, Cuff Size: Normal)   Pulse 68   Temp 98.2 F (36.8 C) (Oral)   Wt 155 lb (70.3 kg)   SpO2 98%   BMI 23.74 kg/m    CC: f/u abnormal CT abd/pelvis Subjective:    Patient ID: Evan Cabrera, male    DOB: [birth year], 58 y.o.   MRN: [id number]  HPI: Evan Cabrera is a 56 y.o. male presenting on 12/27/2016 for Follow-up   Over the past year with more brittle diabetes. Over the last few weeks endorsed epigastric pain with radiation to back, nausea/vomiting, loss of appetite and 8 lb weight loss. Ongoing 5-7/10 pain to lower back for last few weeks, wakes him up at night. We reviewed recent CT scan results.   H/o factor V leiden on chronic coumadin by cardiology clinic and prediabetes. H/o 3 DVT and 2 PE. Last DVT 2005 was after he missed one coumadin dose and ate broccoli. New range 2.5-3.5.   Relevant past medical, surgical, family and social history reviewed and updated as indicated. Interim medical history since our last visit reviewed. Allergies and medications reviewed and updated. Outpatient Medications Prior to Visit  Medication Sig Dispense Refill  . folic acid (FOLVITE) 539 MCG tablet Take 800 mcg by mouth daily.      Marland Kitchen glucose blood test strip 1 each by Other route 4 (four) times daily. Use to check sugar four times daily. Dx: E11.9 **One Touch Ultra** 120 each 2  . Insulin Detemir (LEVEMIR FLEXTOUCH) 100 UNIT/ML Pen Inject 25 Units into the skin at bedtime. 15 mL 1  . Insulin Pen Needle 31G X 5 MM MISC Use to inject insulin as directed 100 each 3  . Multiple Vitamin (MULTIVITAMIN) tablet Take 1 tablet by mouth daily.      . Omega-3 Fatty Acids (FISH OIL) 1200 MG CAPS Take 1,200 mg by mouth daily.      . vitamin C (ASCORBIC ACID) 500 MG tablet Take 500 mg by mouth daily.      Marland Kitchen warfarin (JANTOVEN) 5 MG tablet Take as directed by coumadin clinic 180 tablet 1  . ezetimibe-simvastatin (VYTORIN) 10-40 MG tablet TAKE 1 TABLET  AT BEDTIME 90 tablet 0  . insulin regular (NOVOLIN R RELION) 100 units/mL injection Inject 0.03 mLs (3 Units total) into the skin 3 (three) times daily before meals.    Marland Kitchen glipiZIDE (GLUCOTROL) 5 MG tablet Take 1 tablet (5 mg total) by mouth daily as needed (hyperglycemia).    . metFORMIN (GLUCOPHAGE) 1000 MG tablet Take 1 tablet (1,000 mg total) by mouth 2 (two) times daily with a meal. 135 tablet 3   No facility-administered medications prior to visit.      Per HPI unless specifically indicated in ROS section below Review of Systems     Objective:    BP 130/78 (BP Location: Left Arm, Patient Position: Sitting, Cuff Size: Normal)   Pulse 68   Temp 98.2 F (36.8 C) (Oral)   Wt 155 lb (70.3 kg)   SpO2 98%   BMI 23.74 kg/m   Wt Readings from Last 3 Encounters:  12/27/16 155 lb (70.3 kg)  11/03/16 160 lb (72.6 kg)  01/08/16 160 lb (72.6 kg)    Physical Exam  Constitutional: He appears well-developed and well-nourished. No distress.  Psychiatric: He has a normal mood and affect. His speech is normal and behavior is normal. Judgment and thought content normal.  Nursing note and vitals reviewed.  Lab  Results  Component Value Date   CHOL 110 12/16/2016   HDL 44.30 12/16/2016   LDLCALC 53 12/16/2016   LDLDIRECT 115.0 11/20/2014   TRIG 64.0 12/16/2016   CHOLHDL 2 12/16/2016    Lab Results  Component Value Date   ALT 22 12/16/2016   AST 30 12/16/2016   ALKPHOS 58 12/16/2016   BILITOT 0.5 12/16/2016    Lab Results  Component Value Date   CREATININE 0.96 12/16/2016    Lab Results  Component Value Date   HGBA1C 8.4 (H) 11/03/2016    Results for orders placed or performed in visit on 12/24/16  POCT INR  Result Value Ref Range   INR 3.4   CT Abdomen Pelvis W Contrast CLINICAL DATA:  Abdominal pain, epigastric pain, radiating to back.  EXAM: CT ABDOMEN AND PELVIS WITH CONTRAST  TECHNIQUE: Multidetector CT imaging of the abdomen and pelvis was performed using the  standard protocol following bolus administration of intravenous contrast.  CONTRAST:  177mL ISOVUE-300 IOPAMIDOL (ISOVUE-300) INJECTION 61%  COMPARISON:  Ultrasound 08/01/2015  FINDINGS: Lower chest: Lung bases are clear. No effusions. Heart is normal size.  Hepatobiliary: Numerous solid-appearing masses throughout the liver, the largest in the right hepatic lobe measuring approximately 4 cm. These are concerning for metastases. Diffuse fatty infiltration of the liver. Gallbladder unremarkable.  Pancreas: Area of masslike low-density involving much of the pancreatic body an neck, extending over a a 7 cm length on image 23. Adjacent cystic mass anterior to the pancreas measures up to 3 cm on image 19. Peripancreatic lymph nodes noted, the largest on image 18 with a short axis diameter of 15 mm. No ductal dilatation.  Spleen: No focal abnormality.  Normal size.  Adrenals/Urinary Tract: No adrenal abnormality. No focal renal abnormality. No stones or hydronephrosis. Urinary bladder is unremarkable.  Stomach/Bowel: Appendix is normal. Stomach, large and small bowel grossly unremarkable.  Vascular/Lymphatic: Scattered aortic calcifications. No aneurysm. There is retroperitoneal adenopathy. Conglomerate periaortic lymph node mass has a short axis diameter of 1.7 cm on image 32. No adenopathy in the pelvis. 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  Reproductive: Mild prostate enlargement.  Other: No free fluid or free air.  Musculoskeletal: Several sclerotic foci noted within the left acetabulum and femoral head, nonspecific. These may reflect bone islands. Small sclerotic focus in the left sacral ala.  IMPRESSION: Concern for large pancreatic neck/ body mass concerning for pancreatic malignancy. Recommend further evaluation with pancreatic protocol MRI.  Numerous liver metastases. Peripancreatic and  retroperitoneal adenopathy.  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  These results were called by telephone at the time of interpretation on 12/24/2016 at 4:50 pm to Dr. [name] , who verbally acknowledged these results.  Electronically Signed   By: Rolm Baptise M.D.   On: 12/24/2016 16:50      Assessment & Plan:   Problem List Items Addressed This Visit    Factor 5 Leiden mutation, heterozygous ([place])    Continue coumadin.       HLD (hyperlipidemia)    Requests to stop cholesterol medication at this time - will stop.       Liver mass   Mass of pancreas - Primary    Discussed concerning CT abd/pelvis - large cystic pancreatic mass with smaller masses in liver, peri-pancreatic and retroperitoneal lymphadenopathy. Discussed next step of liver biopsy with holding coumadin and bridging lovenox.  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 Also concerned with financial treatment costs. Requests we cancel liver biopsy at this time. Agrees to meet with Dr Benay Spice next week if possible, would be open to hospice referral if poor prognosis at that time.  For current pain - will Rx hydrocodone 1/2-1 tab BID PRN pain, zofran PRN nausea. Pt will update me with effect.  Will cc Dr Benay Spice who I spoke with earlier this morning. Will cancel liver biopsy as well.  Has supportive church family, has been offered up in prayer, hopeful for faith based healing but aware of poor prognosis.           Follow up plan: No Follow-up on file.  [name], MD

## 2016-12-27 NOTE — Telephone Encounter (Signed)
Left message for patient regarding appointment D/T/Loc/Ph#.  Also called PCP office and notified them of appt also.

## 2016-12-27 NOTE — Assessment & Plan Note (Signed)
Requests to stop cholesterol medication at this time - will stop.

## 2016-12-27 NOTE — Assessment & Plan Note (Signed)
Continue coumadin.  

## 2016-12-27 NOTE — Assessment & Plan Note (Addendum)
Discussed concerning CT abd/pelvis - large cystic pancreatic mass with smaller masses in liver, peri-pancreatic and retroperitoneal lymphadenopathy. Discussed next step of liver biopsy with holding coumadin and bridging lovenox.  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 Also concerned with financial treatment costs. Requests we cancel liver biopsy at this time. Agrees to meet with Dr Benay Spice next week if possible, would be open to hospice referral if poor prognosis at that time.  For current pain - will Rx hydrocodone 1/2-1 tab BID PRN pain, zofran PRN nausea. Pt will update me with effect.  Will cc Dr Benay Spice who I spoke with earlier this morning. Will cancel liver biopsy as well.  Has supportive church family, has been offered up in prayer, hopeful for faith based healing but aware of poor prognosis.

## 2016-12-29 ENCOUNTER — Telehealth: Payer: Self-pay | Admitting: Nurse Practitioner

## 2016-12-29 ENCOUNTER — Encounter: Payer: Self-pay | Admitting: Nurse Practitioner

## 2016-12-29 ENCOUNTER — Ambulatory Visit (HOSPITAL_BASED_OUTPATIENT_CLINIC_OR_DEPARTMENT_OTHER): Payer: 59 | Admitting: Nurse Practitioner

## 2016-12-29 VITALS — BP 128/55 | HR 64 | Temp 98.4°F | Resp 18 | Ht 67.75 in | Wt 155.0 lb

## 2016-12-29 DIAGNOSIS — K869 Disease of pancreas, unspecified: Secondary | ICD-10-CM

## 2016-12-29 DIAGNOSIS — K8689 Other specified diseases of pancreas: Secondary | ICD-10-CM

## 2016-12-29 DIAGNOSIS — R109 Unspecified abdominal pain: Secondary | ICD-10-CM | POA: Diagnosis not present

## 2016-12-29 DIAGNOSIS — K769 Liver disease, unspecified: Secondary | ICD-10-CM

## 2016-12-29 DIAGNOSIS — R634 Abnormal weight loss: Secondary | ICD-10-CM

## 2016-12-29 MED ORDER — OXYCODONE-ACETAMINOPHEN 5-325 MG PO TABS: 1.0000 | ORAL_TABLET | Freq: Four times a day (QID) | ORAL | 0 refills | Status: DC | PRN

## 2016-12-29 MED ORDER — PROMETHAZINE HCL 25 MG PO TABS: 12.5000 mg | ORAL_TABLET | Freq: Three times a day (TID) | ORAL | 0 refills | Status: DC | PRN

## 2016-12-29 NOTE — Telephone Encounter (Signed)
No 12/26 los.  

## 2016-12-29 NOTE — Progress Notes (Addendum)
New Hematology/Oncology Consult   Referral MD: Dr. Danise Mina  [phone]  Reason for Referral: Pancreas mass and liver lesions  HPI: [name] is a 56 year old man who was diagnosed with diabetes about a year and a half ago.  Over the past 2-3 months blood sugars have been more difficult to control and he developed progressively worsening abdominal and pain.  He had a CT abdomen/pelvis on 12/24/2016 which showed numerous solid appearing masses throughout the liver with the largest in the right hepatic lobe measuring approximately 4 cm; diffuse fatty infiltration of the liver; area of masslike low density involving much of the pancreatic body and neck extending over 7 cm; adjacent cystic mass anterior to the pancreas measuring up to 3 cm.  Peripancreatic and retroperitoneal adenopathy noted.    Past Medical History:  Diagnosis Date  . Benign prostatic hypertrophy history  . Depression 2005  . DVT (deep venous thrombosis) ([place]) 2005  . 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 Factor 5 Leiden mutation, heterozygous ([place])   . GERD (gastroesophageal reflux disease)   . Hyperlipemia   . Pulmonary embolism ([place]) [id number]  . 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 Type 2 diabetes mellitus ([place])   :  Past Surgical History:  Procedure Laterality Date  . COLONOSCOPY  10/2011   Dr Glennon Hamilton  . COLONOSCOPY  12/2014   rpt 5 yrs Glennon Hamilton)  . KNEE SURGERY Right 2003   for cartilage  . VASECTOMY  1988  . WISDOM TOOTH EXTRACTION    :   Current Outpatient Medications:  .  folic acid (FOLVITE) 098 MCG tablet, Take 800 mcg by mouth daily.  , Disp: , Rfl:  .  glucose blood test strip, 1 each by Other route 4 (four) times daily. Use to check sugar four times daily. Dx: E11.9 **One Touch Ultra**, Disp: 120 each, Rfl: 2 .  HYDROcodone-acetaminophen (NORCO/VICODIN) 5-325 MG tablet, Take 0.5-1 tablets by mouth 3 (three) times daily as needed for moderate pain., Disp: 30 tablet, Rfl: 0 .  Insulin  Detemir (LEVEMIR FLEXTOUCH) 100 UNIT/ML Pen, Inject 25 Units into the skin at bedtime., Disp: 15 mL, Rfl: 1 .  Insulin Pen Needle 31G X 5 MM MISC, Use to inject insulin as directed, Disp: 100 each, Rfl: 3 .  insulin regular (NOVOLIN R RELION) 100 units/mL injection, Inject 0.03 mLs (3 Units total) into the skin 3 (three) times daily before meals., Disp: , Rfl:  .  Multiple Vitamin (MULTIVITAMIN) tablet, Take 1 tablet by mouth daily.  , Disp: , Rfl:  .  Omega-3 Fatty Acids (FISH OIL) 1200 MG CAPS, Take 1,200 mg by mouth daily.  , Disp: , Rfl:  .  ondansetron (ZOFRAN) 4 MG tablet, Take 1-2 tablets (4-8 mg total) by mouth 2 (two) times daily as needed for nausea or vomiting., Disp: 20 tablet, Rfl: 0 .  vitamin C (ASCORBIC ACID) 500 MG tablet, Take 500 mg by mouth daily.  , Disp: , Rfl:  .  warfarin (JANTOVEN) 5 MG tablet, Take as directed by coumadin clinic, Disp: 180 tablet, Rfl: 1:  :  Allergies  Allergen Reactions  . Niacin     REACTION: rash, itching, hot  . Crestor [Rosuvastatin Calcium]     Memory loss  . Codeine     nausea  :  FH: Maternal grandfather with lung cancer.  Mother with history of uterine cancer.  His father died in 11/25/2022 of this year, MI, type 2 diabetes.  SOCIAL HISTORY: He lives in [place].  He is a [employer] park manager.  He is married.  He has a son and daughter.  No tobacco or alcohol use.  Review of Systems: He notes diabetes has been more difficult to control over the past 2-3 months.  Around the same time he developed abdominal and back pain which has progressively worsened.  He takes hydrocodone with partial relief of the pain.  He is "clammy" at times which he attributes to the pain medication.  He has intermittent nausea and recent constipation which he also attributes to the pain medication.  He denies shortness of breath.  No chest pain.  No leg swelling or calf pain.  No urinary symptoms.  No numbness or tingling in his hands or feet.  No bleeding.   He has a good appetite but has lost about 8 pounds recently.    Physical Exam:  Blood pressure (!) 128/55, pulse 64, temperature 98.4 F (36.9 C), temperature source Oral, resp. rate 18, height 5' 7.75" (1.721 m), weight 155 lb (70.3 kg), SpO2 100 %.  HEENT: Pupils equal round and reactive to light.  Extraocular movements intact.  Sclera anicteric.  Oropharynx is without thrush or ulceration. Lungs: Lungs clear bilaterally. Cardiac: Regular rate and rhythm. Abdomen: Hepatomegaly.  Fullness left mid abdomen with associated tenderness.  No apparent ascites. Vascular: No leg edema. Lymph nodes: No palpable cervical, supraclavicular, axillary or inguinal lymph nodes. Neurologic: Alert and oriented.  Motor strength 5/5.  DTRs 2+, symmetric. Skin: No rash.  LABS:  RADIOLOGY:  Ct Abdomen Pelvis W Contrast  Result Date: 12/24/2016 CLINICAL DATA:  Abdominal pain, epigastric pain, radiating to back. EXAM: CT ABDOMEN AND PELVIS WITH CONTRAST TECHNIQUE: Multidetector CT imaging of the abdomen and pelvis was performed using the standard protocol following bolus administration of intravenous contrast. CONTRAST:  183m ISOVUE-300 IOPAMIDOL (ISOVUE-300) INJECTION 61% COMPARISON:  Ultrasound 08/01/2015 FINDINGS: Lower chest: Lung bases are clear. No effusions. Heart is normal size. Hepatobiliary: Numerous solid-appearing masses throughout the liver, the largest in the right hepatic lobe measuring approximately 4 cm. These are concerning for metastases. Diffuse fatty infiltration of the liver. Gallbladder unremarkable. Pancreas: Area of masslike low-density involving much of the pancreatic body an neck, extending over a a 7 cm length on image 23. Adjacent cystic mass anterior to the pancreas measures up to 3 cm on image 19. Peripancreatic lymph nodes noted, the largest on image 18 with a short axis diameter of 15 mm. No ductal dilatation. Spleen: No focal abnormality.  Normal size. Adrenals/Urinary Tract: No  adrenal abnormality. No focal renal abnormality. No stones or hydronephrosis. Urinary bladder is unremarkable. Stomach/Bowel: Appendix is normal. Stomach, large and small bowel grossly unremarkable. Vascular/Lymphatic: Scattered aortic calcifications. No aneurysm. There is retroperitoneal adenopathy. Conglomerate periaortic lymph node mass has a short axis diameter of 1.7 cm on image 32. No adenopathy in the pelvis. 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 Reproductive: Mild prostate enlargement. Other: No free fluid or free air. Musculoskeletal: Several sclerotic foci noted within the left acetabulum and femoral head, nonspecific. These may reflect bone islands. Small sclerotic focus in the left sacral ala. IMPRESSION: Concern for large pancreatic neck/ body mass concerning for pancreatic malignancy. Recommend further evaluation with pancreatic protocol MRI. Numerous liver metastases. Peripancreatic and retroperitoneal adenopathy. 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 These results were called by telephone at the time of interpretation on 12/24/2016  at 4:50 pm to Dr. [name] , who verbally acknowledged these results. Electronically Signed   By: Rolm Baptise M.D.   On: 12/24/2016 16:50    Assessment and Plan:   1. Pancreas neck/body mass, numerous liver lesions, peripancreatic and retroperitoneal adenopathy on CT 12/24/2016. 2. Abdominal/back pain secondary to #1. 3. Weight loss secondary to #1. 4. Diabetes mellitus. 5. Factor V Leiden gene mutation. 6. History of elevated homocystine. 7. History of PE and DVT maintained on chronic Coumadin anticoagulation  Disposition: [name] is a 56 year old man who recently presented with a 2-54-monthhistory of progressive abdominal/back pain.  CT scans show a mass involving the neck and body of the pancreas as well as multiple liver lesions.  Dr. SBenay Spice reviewed the CT report/images with Mr. TBasuand his wife at today's visit.  They understand the likely diagnosis is metastatic pancreas cancer involving the liver.  Dr. SBenay Spicerecommends proceeding with biopsy of a liver lesion to confirm the diagnosis.  We had preliminary discussion regarding treatment options for metastatic pancreas cancer including FOLFIRINOX, gemcitabine/Abraxane.  Mr. TClecklerwould like to have further discussion at home with his wife but at present is leaning toward a supportive/comfort care approach.  He will contact u[place]ore[place]ith his decision.  For pain control we gave him a prescription for Percocet 1-2 tablets every 6 hours as needed.  He was also given a prescription for Phenergan 12.5 mg every 6 hours as needed for nausea.  He will begin daily MiraLAX.  He did not wish to schedule a formal follow-up appointment at this time.  We are happy to see him in the future with any problems, questions or concerns.  Patient seen with Dr. SBenay Spice   LNed Card NP 12/29/2016, 9:50 AM   This was a shared visit with LNed Card  Mr. TBoettcherwas interviewed and examined.  The clinical presentation is consistent with metastatic pancreas cancer.  We discussed the probable diagnosis, prognosis, and treatment options with Mr. TCarlton Adam  We recommend a diagnostic biopsy to confirm adenocarcinoma of the pancreas and obtain tissue for MSI testing.  He understands it is possible he has an uncommon type of pancreas cancer such as a neuroendocrine tumor.  His initial indication is will most likely decide against a biopsy and treatment of the malignancy.  He will let uKoreaknow within the next few days.  He will try oxycodone for pain.  He plans to continue clinical follow-up with Dr. GDanise Minaif he decides against a biopsy and treatment.  I am available to see him as needed.  BJulieanne Manson MD

## 2016-12-30 ENCOUNTER — Ambulatory Visit: Payer: 59 | Admitting: Nurse Practitioner

## 2016-12-31 ENCOUNTER — Ambulatory Visit: Payer: Self-pay | Admitting: *Deleted

## 2016-12-31 ENCOUNTER — Other Ambulatory Visit: Payer: Self-pay | Admitting: Family Medicine

## 2016-12-31 ENCOUNTER — Other Ambulatory Visit: Payer: Self-pay | Admitting: Nurse Practitioner

## 2016-12-31 DIAGNOSIS — K8689 Other specified diseases of pancreas: Secondary | ICD-10-CM

## 2016-12-31 MED ORDER — ONDANSETRON HCL 4 MG PO TABS: 4.0000 mg | ORAL_TABLET | Freq: Two times a day (BID) | ORAL | 0 refills | Status: DC | PRN

## 2016-12-31 MED ORDER — HYDROCODONE-ACETAMINOPHEN 5-325 MG PO TABS: 0.5000 | ORAL_TABLET | Freq: Four times a day (QID) | ORAL | 0 refills | Status: DC | PRN

## 2016-12-31 NOTE — Progress Notes (Signed)
  Oncology Nurse Navigator Documentation  Navigator Location: [facility] (12/31/16 1424)   )Navigator Encounter Type: Telephone (12/31/16 1424) Telephone: Incoming Call (12/31/16 1424)    Patient called for assistance with pain medication refill and nausea med. Prescription in narcotic book. Patient understands that prescription will need to be picked up by 4:30 PM. Ondansetron prescription sent electronically to patient's pharmacy.                       Interventions: Medication Assistance (12/31/16 1424)            Acuity: Level 2 (12/31/16 1424)   Acuity Level 2: Ongoing guidance and education throughout treatment as needed;Other(Medication refill for pain management) (12/31/16 1424)     Time Spent with Patient: 30 (12/31/16 1424)

## 2016-12-31 NOTE — Telephone Encounter (Signed)
Copied from [place]. Topic: Quick Communication - See Telephone Encounter >> Dec 31, 2016  8:59 AM Ether Griffins B wrote: CRM for notification. See Telephone encounter for:  Pt needing refill on hydrocodone and zofran.  12/31/16.

## 2016-12-31 NOTE — Telephone Encounter (Signed)
error 

## 2016-12-31 NOTE — Telephone Encounter (Signed)
Spoke to pt and advised Rx is too soon. Pt was given Rx on 12/27/16 #30.

## 2016-12-31 NOTE — Progress Notes (Signed)
  Oncology Nurse Navigator Documentation  Navigator Location: [facility] (12/31/16 1258) Referral date to RadOnc/MedOnc: 12/25/16 (12/31/16 1258) )Navigator Encounter Type: Introductory phone call;Telephone (12/31/16 1258) Telephone: Outgoing Call (12/31/16 1258) Abnormal Finding Date: 12/24/16 (12/31/16 1258)                   Treatment Phase: Pre-Tx/Tx Discussion (12/31/16 1258)     Interventions: Education;Psycho-social support (12/31/16 1258)     Education Method: Verbal (12/31/16 1258)   I called patient to introduce myself and my role as GI navigator. I provided my contact information. Patient states that at this time he does not wish to be treated Patient will call back if he needs Dr. Benay Spice for pain control. Patient verbalized understanding that he can call back with questions or concerns. Acuity: Level 1 (12/31/16 1258) Acuity Level 1: Initial guidance, education and coordination as needed;Minimal follow up required (12/31/16 1258)       Time Spent with Patient: 30 (12/31/16 1258)

## 2017-01-03 ENCOUNTER — Telehealth: Payer: Self-pay | Admitting: Oncology

## 2017-01-03 NOTE — Telephone Encounter (Signed)
Scheduled appt per 12/28 sch message - patient is aware of appt date and time.  

## 2017-01-05 ENCOUNTER — Ambulatory Visit: Payer: Self-pay

## 2017-01-05 ENCOUNTER — Ambulatory Visit: Payer: 59 | Admitting: Oncology

## 2017-01-05 ENCOUNTER — Telehealth: Payer: Self-pay | Admitting: Family Medicine

## 2017-01-05 ENCOUNTER — Telehealth: Payer: Self-pay

## 2017-01-05 DIAGNOSIS — Z7901 Long term (current) use of anticoagulants: Secondary | ICD-10-CM

## 2017-01-05 DIAGNOSIS — D6851 Activated protein C resistance: Secondary | ICD-10-CM

## 2017-01-05 LAB — POCT INR: INR: 2.3

## 2017-01-05 NOTE — Telephone Encounter (Signed)
Copied from [place] [id number]. Topic: Quick Communication - See Telephone Encounter >> Jan 05, 2017  8:33 AM Burnis Medin, NT wrote: CRM for notification. See Telephone encounter for: Pt called and wanted to leave a message for the nurse to call him back regarding his INR. Pt would like a call back.  01/05/17.

## 2017-01-05 NOTE — Telephone Encounter (Signed)
Spoke with patient.  Refer to [place] Encounter note re: details around INR and dosing.

## 2017-01-05 NOTE — Telephone Encounter (Signed)
Pt called inquiring about appt with Dr. Benay Spice. 01/05/17 appt would not work for pt. Pt verbalized in VM that he received a phone call from scheduling that Ned Card, NP would like to see him on Friday 01/07/17. He wanted to confirm this and make sure that 01/05/17 appt had been cancelled. This RN reviewed and pt no longer has appt for 01/05/17, but 01/07/17 at 1515. Left VM on pt phone as requested and confirmed cancellation of 01/05/17 appt and verbalized Friday appt, time, and provider to be seen.

## 2017-01-06 ENCOUNTER — Encounter: Payer: Self-pay | Admitting: Family Medicine

## 2017-01-06 MED ORDER — INSULIN PEN NEEDLE 31G X 5 MM MISC: 3 refills | Status: AC

## 2017-01-06 MED ORDER — GLUCOSE BLOOD VI STRP: ORAL_STRIP | 3 refills | Status: AC

## 2017-01-07 ENCOUNTER — Telehealth: Payer: Self-pay | Admitting: Oncology

## 2017-01-07 ENCOUNTER — Ambulatory Visit (HOSPITAL_BASED_OUTPATIENT_CLINIC_OR_DEPARTMENT_OTHER): Payer: 59 | Admitting: Nurse Practitioner

## 2017-01-07 ENCOUNTER — Other Ambulatory Visit: Payer: Self-pay | Admitting: Family Medicine

## 2017-01-07 ENCOUNTER — Encounter: Payer: Self-pay | Admitting: Nurse Practitioner

## 2017-01-07 VITALS — BP 136/77 | HR 62 | Temp 98.2°F | Resp 17 | Ht 67.75 in | Wt 154.1 lb

## 2017-01-07 DIAGNOSIS — Z86718 Personal history of other venous thrombosis and embolism: Secondary | ICD-10-CM

## 2017-01-07 DIAGNOSIS — R110 Nausea: Secondary | ICD-10-CM

## 2017-01-07 DIAGNOSIS — K869 Disease of pancreas, unspecified: Secondary | ICD-10-CM | POA: Diagnosis not present

## 2017-01-07 DIAGNOSIS — Z86711 Personal history of pulmonary embolism: Secondary | ICD-10-CM | POA: Diagnosis not present

## 2017-01-07 DIAGNOSIS — R109 Unspecified abdominal pain: Secondary | ICD-10-CM | POA: Diagnosis not present

## 2017-01-07 DIAGNOSIS — K769 Liver disease, unspecified: Secondary | ICD-10-CM | POA: Diagnosis not present

## 2017-01-07 DIAGNOSIS — M549 Dorsalgia, unspecified: Secondary | ICD-10-CM

## 2017-01-07 DIAGNOSIS — Z7901 Long term (current) use of anticoagulants: Secondary | ICD-10-CM

## 2017-01-07 DIAGNOSIS — R634 Abnormal weight loss: Secondary | ICD-10-CM | POA: Diagnosis not present

## 2017-01-07 DIAGNOSIS — K8689 Other specified diseases of pancreas: Secondary | ICD-10-CM

## 2017-01-07 MED ORDER — OXYCODONE HCL ER 10 MG PO T12A
10.0000 mg | EXTENDED_RELEASE_TABLET | Freq: Two times a day (BID) | ORAL | 0 refills | Status: DC
Start: 2017-01-07 — End: 2017-02-05

## 2017-01-07 MED ORDER — ONDANSETRON HCL 4 MG PO TABS: 4.0000 mg | ORAL_TABLET | Freq: Four times a day (QID) | ORAL | 0 refills | Status: AC | PRN

## 2017-01-07 NOTE — Telephone Encounter (Signed)
Fwd to [facility]

## 2017-01-07 NOTE — Progress Notes (Addendum)
  Center OFFICE PROGRESS NOTE   Diagnosis: Pancreas mass and liver lesions  INTERVAL HISTORY:   Mr. Vigen returns for follow-up.  He has decided against proceeding with a biopsy.  He would like to focus on supportive/comfort measures.  Abdominal pain has been increased over the past several days.  He is taking hydrocodone during the day and oxycodone at bedtime.  Nausea is controlled with Zofran and Phenergan.  He is on a bowel regimen and reports bowels moving on a regular basis.  No shortness of breath.  Appetite overall diminished.  Objective:  Vital signs in last 24 hours:  Blood pressure 136/77, pulse 62, temperature 98.2 F (36.8 C), temperature source Oral, resp. rate 17, height 5' 7.75" (1.721 m), weight 154 lb 1.6 oz (69.9 kg), SpO2 100 %.    HEENT: No thrush or ulcers. Resp: Lungs clear bilaterally. Cardio: Regular rate and rhythm. GI: Abdomen soft and nontender.  No hepatomegaly.  No mass. Vascular: No leg edema.    Lab Results:  Lab Results  Component Value Date   WBC 4.9 12/16/2016   HGB 12.7 (L) 12/16/2016   HCT 38.1 (L) 12/16/2016   MCV 90.0 12/16/2016   PLT 141.0 (L) 12/16/2016   NEUTROABS 3.4 12/16/2016    Imaging:  No results found.  Medications: I have reviewed the patient's current medications.  Assessment/Plan: 1. Pancreas neck/body mass, numerous liver lesions, peripancreatic and retroperitoneal adenopathy on CT 12/24/2016. 2. Abdominal/back pain secondary to #1. 3. Weight loss secondary to #1. 4. Diabetes mellitus. 5. Factor V Leiden gene mutation. 6. History of elevated homocystine. 7. 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 is a 57 year old man with probable metastatic pancreas cancer.  He has decided to focus on supportive/comfort care.  He would like to enroll in the hospice program.  We have initiated a referral to the [facility] program.  We discussed  CPR/ACLS.  He will be placed on NO CODE BLUE status.  For pain control he will begin OxyContin 10 mg every 12 hours.  He will continue hydrocodone or Percocet as needed for breakthrough pain.  He and his wife understand he cannot drive while taking these medications.  For the nausea he will continue Zofran, Phenergan as needed.  We scheduled a return visit in 4 weeks.  He will contact the office in the interim with any problems.  Patient seen with Dr. Benay Spice.    Ned Card ANP/GNP-BC   01/07/2017  3:54 PM  This was a shared visit with Ned Card.  Mr. Payson has decided against treatment of the pancreas cancer.  He requests a referral to the [facility] program.  He agrees to a no CODE BLUE status.  We adjusted the narcotic pain regimen today.  He will return for an office visit in 1 month.  He will contact us in the interim as needed.  Julieanne Manson, MD

## 2017-01-07 NOTE — Telephone Encounter (Signed)
Gave avs and calendar for february °

## 2017-01-07 NOTE — Telephone Encounter (Signed)
Patient is compliant with coumadin management.  Will refill X 6 months.   

## 2017-01-10 ENCOUNTER — Telehealth: Payer: Self-pay | Admitting: Emergency Medicine

## 2017-01-10 NOTE — Telephone Encounter (Signed)
Hospice referral sent to [facility].

## 2017-01-12 ENCOUNTER — Telehealth: Payer: Self-pay | Admitting: *Deleted

## 2017-01-12 ENCOUNTER — Telehealth: Payer: Self-pay

## 2017-01-12 ENCOUNTER — Encounter: Payer: 59 | Admitting: Family Medicine

## 2017-01-12 ENCOUNTER — Ambulatory Visit: Payer: Self-pay

## 2017-01-12 DIAGNOSIS — D6851 Activated protein C resistance: Secondary | ICD-10-CM

## 2017-01-12 DIAGNOSIS — Z7901 Long term (current) use of anticoagulants: Secondary | ICD-10-CM

## 2017-01-12 LAB — POCT INR: INR: 4

## 2017-01-12 NOTE — Telephone Encounter (Signed)
Message from [place] in [place]. Pt declined admission at this time due to insurance issues. He was seen on 1/8 and is hospice eligible if he decides to elect benefit in the future.  Will make MD aware.

## 2017-01-12 NOTE — Patient Instructions (Signed)
  Received fax report from [facility] and verbal report from patient.  INR 4.0 01/12/17  Patient is to take 1/2 pill (2.5mg ) only today and then decrease weekly dosing to 7.5mg  daily EXCEPT for 5mg  on Mondays and recheck in 2 weeks.    Patient verbalizes understanding of all instructions and will call me in 2 weeks with report.

## 2017-01-12 NOTE — Telephone Encounter (Signed)
Patient called stating that he received a call from [place]. States "zofran is not covered through insurance" and that "they want someone from your office to call them". He provided a call back #: [phone] and a reference #: [id number]. Will route message to our financial office.

## 2017-01-13 ENCOUNTER — Encounter: Payer: Self-pay | Admitting: Oncology

## 2017-01-13 NOTE — Progress Notes (Signed)
Submitted auth and quantity exception request for Ondansetron today.  It was approved from 01/13/17 - 07/13/17.  I informed pt of this.

## 2017-01-17 ENCOUNTER — Other Ambulatory Visit: Payer: Self-pay | Admitting: Family Medicine

## 2017-01-17 ENCOUNTER — Encounter: Payer: Self-pay | Admitting: Family Medicine

## 2017-01-17 NOTE — Telephone Encounter (Signed)
Spoke with pt to confirm his dose of Levemir.  States he is taking 25 units daily.

## 2017-01-18 ENCOUNTER — Other Ambulatory Visit: Payer: Self-pay

## 2017-01-20 ENCOUNTER — Telehealth: Payer: Self-pay

## 2017-01-20 DIAGNOSIS — K8689 Other specified diseases of pancreas: Secondary | ICD-10-CM

## 2017-01-20 MED ORDER — HYDROCODONE-ACETAMINOPHEN 5-325 MG PO TABS: 1.0000 | ORAL_TABLET | Freq: Four times a day (QID) | ORAL | 0 refills | Status: DC | PRN

## 2017-01-20 NOTE — Telephone Encounter (Signed)
Spoke with pt to inform that hydrocodone refill is available for pick up. Pt asked that his dosage be increased from 0 5-1tab q6h prn to 1tab q6 prn. OK per Dr. Benay Spice.

## 2017-01-26 ENCOUNTER — Ambulatory Visit: Payer: Self-pay

## 2017-01-26 ENCOUNTER — Telehealth: Payer: Self-pay | Admitting: *Deleted

## 2017-01-26 ENCOUNTER — Other Ambulatory Visit: Payer: Self-pay | Admitting: *Deleted

## 2017-01-26 DIAGNOSIS — Z7901 Long term (current) use of anticoagulants: Secondary | ICD-10-CM

## 2017-01-26 DIAGNOSIS — K8689 Other specified diseases of pancreas: Secondary | ICD-10-CM

## 2017-01-26 DIAGNOSIS — D6851 Activated protein C resistance: Secondary | ICD-10-CM

## 2017-01-26 LAB — POCT INR: INR: 4.9

## 2017-01-26 MED ORDER — OXYCODONE-ACETAMINOPHEN 5-325 MG PO TABS: 1.0000 | ORAL_TABLET | Freq: Four times a day (QID) | ORAL | 0 refills | Status: DC | PRN

## 2017-01-26 MED ORDER — PROMETHAZINE HCL 25 MG PO TABS: 12.5000 mg | ORAL_TABLET | Freq: Three times a day (TID) | ORAL | 1 refills | Status: DC | PRN

## 2017-01-26 NOTE — Telephone Encounter (Signed)
Spoke to patient.  He is having a hard time testing right now but was able to get his level since this am and INR is 4.9.  Please refer to coag encounter for today for further details and instructions.

## 2017-01-26 NOTE — Patient Instructions (Signed)
Received fax report from [facility] and verbal report from patient.  INR 4.9 01/26/17  Patient is to hold his coumadin today (1/23) and tomorrow 1/24 and then decrease weekly dosing to 7.5mg  daily EXCEPT for 5mg  on Mondays, Wednesdays and Fridays. Recheck in 1-2 weeks on 02/04/17.     Patient verbalizes understanding of all instructions and will call me next week with report.

## 2017-01-26 NOTE — Telephone Encounter (Addendum)
Message from pt requesting Oxycodone refill. Returned call, he reports he is taking 2 Percocet every evening for abdominal pain that's usually 7/10 at night. He finds he is able to function during the day while taking Hydrocodone. Pt also requested promethazine refill. Antiemetic escribed, per Dr. Benay Spice. Pt stated his wife will pick up Oxycodone script.

## 2017-01-26 NOTE — Telephone Encounter (Signed)
Copied from [place] [id number]. Topic: General - Other >> Jan 26, 2017  8:40 AM Ivar Drape wrote: Reason for CRM:  Patient would like a return call from [facility].  He said he is having problems taking his INR and needs to talk to her about it.

## 2017-02-03 ENCOUNTER — Encounter (HOSPITAL_COMMUNITY): Payer: Self-pay | Admitting: Emergency Medicine

## 2017-02-03 ENCOUNTER — Emergency Department (HOSPITAL_COMMUNITY): Payer: 59

## 2017-02-03 ENCOUNTER — Observation Stay (HOSPITAL_COMMUNITY)
Admission: EM | Admit: 2017-02-03 | Discharge: 2017-02-05 | Disposition: A | Discharge status: Home / Self Care | Payer: 59 | Attending: Family Medicine | Admitting: Family Medicine

## 2017-02-03 DIAGNOSIS — Z794 Long term (current) use of insulin: Secondary | ICD-10-CM | POA: Diagnosis not present

## 2017-02-03 DIAGNOSIS — E118 Type 2 diabetes mellitus with unspecified complications: Secondary | ICD-10-CM

## 2017-02-03 DIAGNOSIS — Z7901 Long term (current) use of anticoagulants: Secondary | ICD-10-CM | POA: Diagnosis not present

## 2017-02-03 DIAGNOSIS — C259 Malignant neoplasm of pancreas, unspecified: Secondary | ICD-10-CM | POA: Insufficient documentation

## 2017-02-03 DIAGNOSIS — R52 Pain, unspecified: Secondary | ICD-10-CM | POA: Diagnosis present

## 2017-02-03 DIAGNOSIS — Z66 Do not resuscitate: Secondary | ICD-10-CM | POA: Insufficient documentation

## 2017-02-03 DIAGNOSIS — Z86711 Personal history of pulmonary embolism: Secondary | ICD-10-CM | POA: Diagnosis not present

## 2017-02-03 DIAGNOSIS — Z79899 Other long term (current) drug therapy: Secondary | ICD-10-CM | POA: Insufficient documentation

## 2017-02-03 DIAGNOSIS — R109 Unspecified abdominal pain: Principal | ICD-10-CM | POA: Insufficient documentation

## 2017-02-03 DIAGNOSIS — IMO0002 Reserved for concepts with insufficient information to code with codable children: Secondary | ICD-10-CM | POA: Diagnosis present

## 2017-02-03 DIAGNOSIS — E785 Hyperlipidemia, unspecified: Secondary | ICD-10-CM | POA: Diagnosis present

## 2017-02-03 DIAGNOSIS — D6851 Activated protein C resistance: Secondary | ICD-10-CM | POA: Insufficient documentation

## 2017-02-03 DIAGNOSIS — E1165 Type 2 diabetes mellitus with hyperglycemia: Secondary | ICD-10-CM | POA: Diagnosis present

## 2017-02-03 DIAGNOSIS — E119 Type 2 diabetes mellitus without complications: Secondary | ICD-10-CM | POA: Diagnosis not present

## 2017-02-03 DIAGNOSIS — G4730 Sleep apnea, unspecified: Secondary | ICD-10-CM | POA: Diagnosis not present

## 2017-02-03 DIAGNOSIS — Z86718 Personal history of other venous thrombosis and embolism: Secondary | ICD-10-CM | POA: Diagnosis not present

## 2017-02-03 DIAGNOSIS — C787 Secondary malignant neoplasm of liver and intrahepatic bile duct: Secondary | ICD-10-CM | POA: Insufficient documentation

## 2017-02-03 DIAGNOSIS — D638 Anemia in other chronic diseases classified elsewhere: Secondary | ICD-10-CM | POA: Insufficient documentation

## 2017-02-03 DIAGNOSIS — K8689 Other specified diseases of pancreas: Secondary | ICD-10-CM

## 2017-02-03 DIAGNOSIS — K219 Gastro-esophageal reflux disease without esophagitis: Secondary | ICD-10-CM | POA: Diagnosis not present

## 2017-02-03 LAB — COMPREHENSIVE METABOLIC PANEL
ALBUMIN: 3.5 g/dL (ref 3.5–5.0)
ALT: 23 U/L (ref 17–63)
ANION GAP: 8 (ref 5–15)
AST: 69 U/L — ABNORMAL HIGH (ref 15–41)
Alkaline Phosphatase: 140 U/L — ABNORMAL HIGH (ref 38–126)
BUN: 15 mg/dL (ref 6–20)
CHLORIDE: 98 mmol/L — AB (ref 101–111)
CO2: 29 mmol/L (ref 22–32)
CREATININE: 0.79 mg/dL (ref 0.61–1.24)
Calcium: 8.9 mg/dL (ref 8.9–10.3)
GFR calc non Af Amer: >60 mL/min (ref >60)
Glucose, Bld: 152 mg/dL — ABNORMAL HIGH (ref 65–99)
Potassium: 4.2 mmol/L (ref 3.5–5.1)
SODIUM: 135 mmol/L (ref 135–145)
Total Bilirubin: 0.4 mg/dL (ref 0.3–1.2)
Total Protein: 7.2 g/dL (ref 6.5–8.1)

## 2017-02-03 LAB — CBC WITH DIFFERENTIAL/PLATELET
Basophils Absolute: 0 10*3/uL (ref 0.0–0.1)
Basophils Relative: 0 %
Eosinophils Absolute: 0.1 10*3/uL (ref 0.0–0.7)
Eosinophils Relative: 1 %
HCT: 30.6 % — ABNORMAL LOW (ref 39.0–52.0)
HEMOGLOBIN: 10.3 g/dL — AB (ref 13.0–17.0)
LYMPHS PCT: 19 %
Lymphs Abs: 1.2 10*3/uL (ref 0.7–4.0)
MCH: 29.1 pg (ref 26.0–34.0)
MCHC: 33.7 g/dL (ref 30.0–36.0)
MCV: 86.4 fL (ref 78.0–100.0)
Monocytes Absolute: 0.8 10*3/uL (ref 0.1–1.0)
Monocytes Relative: 12 %
NEUTROS PCT: 68 %
Neutro Abs: 4.3 10*3/uL (ref 1.7–7.7)
Platelets: 130 10*3/uL — ABNORMAL LOW (ref 150–400)
RBC: 3.54 MIL/uL — AB (ref 4.22–5.81)
RDW: 13.4 % (ref 11.5–15.5)
WBC: 6.4 10*3/uL (ref 4.0–10.5)

## 2017-02-03 LAB — LIPASE, BLOOD: Lipase: 23 U/L (ref 11–51)

## 2017-02-03 MED ORDER — IOPAMIDOL (ISOVUE-300) INJECTION 61%
INTRAVENOUS | Status: AC
  Administered 2017-02-03: 100 mL
  Filled 2017-02-03: qty 100

## 2017-02-03 MED ORDER — HYDROMORPHONE HCL 1 MG/ML IJ SOLN
1.0000 mg | Freq: Once | INTRAMUSCULAR | Status: AC
  Administered 2017-02-03: 1 mg via INTRAVENOUS
  Filled 2017-02-03: qty 1

## 2017-02-03 NOTE — ED Notes (Signed)
Bed: WA15 Expected date:  Expected time:  Means of arrival:  Comments: Triage 2  

## 2017-02-03 NOTE — ED Triage Notes (Signed)
Pt comes in with new onset right upper abdominal pain that started today that comes and goes in waves.  Has taken gas x, antacids, and his normal home pain medications without relief. Sees Dr. Benay Spice from cancer center. Next doctor's appointment is Monday.  Pt is not seeking active treatment at this time. Clotting disorder: Factor X.  Pt is on coumadin and last INR 4.9.

## 2017-02-03 NOTE — ED Provider Notes (Signed)
[place] DEPT Provider Note   CSN: [id number] Arrival date & time: 02/03/17  2048     History   Chief Complaint Chief Complaint  Patient presents with  . Abdominal Pain    HPI Evan Cabrera is a 57 y.o. male.  The history is provided by the patient and the spouse. No language interpreter was used.  Abdominal Pain     Evan Cabrera is a 57 y.o. male who presents to the Emergency Department complaining of abdominal pain.  He has a history of metastatic pancreatic cancer as well as factor V Leiden on Coumadin.  He reports severe sudden onset right upper quadrant pain that began at 11:00 today.  He also reports increased belching.  Position of comfort is fetal position.  Pain is significantly worse with straightening and ambulation.  No fevers, vomiting, diarrhea, constipation.  No prior similar symptoms.  He is taking OxyContin at home with no significant improvement in his symptoms. Past Medical History:  Diagnosis Date  . Benign prostatic hypertrophy history  . Depression 2005  . DVT (deep venous thrombosis) ([place]) 2005  . 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 Factor 5 Leiden mutation, heterozygous ([place])   . GERD (gastroesophageal reflux disease)   . Hyperlipemia   . Pulmonary embolism ([place]) [id number]  . 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 Type 2 diabetes mellitus [facility])     Patient Active Problem List   Diagnosis Date Noted  . Pain 02/04/2017  . Liver mass 12/27/2016  . Mass of pancreas 12/27/2016  . Blurry vision, bilateral 07/21/2015  . Diabetes mellitus type 2, uncontrolled, with complications ([place]) 61/95/0932  . Routine general medical examination at a health care facility 12/31/2013  . Personal history of colonic polyps 09/18/2012  . GERD (gastroesophageal reflux disease) 08/28/2010  . Anticoagulant long-term use 08/28/2010  . Pulmonary embolism ([place]) 03/02/2010  . HLD (hyperlipidemia) 06/20/2007  . Factor 5 Leiden mutation,  heterozygous ([place]) 06/20/2007  . BPH (benign prostatic hyperplasia) 06/20/2007  . Sleep apnea 06/20/2007    Past Surgical History:  Procedure Laterality Date  . COLONOSCOPY  10/2011   Dr Glennon Hamilton  . COLONOSCOPY  12/2014   rpt 5 yrs Glennon Hamilton)  . KNEE SURGERY Right 2003   for cartilage  . VASECTOMY  1988  . WISDOM TOOTH EXTRACTION         Home Medications    Prior to Admission medications   Medication Sig Start Date End Date Taking? Authorizing Provider  esomeprazole (NEXIUM) 40 MG capsule Take 40 mg by mouth once.   Yes [provider]  folic acid (FOLVITE) 671 MCG tablet Take 800 mcg by mouth daily.     Yes [provider]  HYDROcodone-acetaminophen (NORCO/VICODIN) 5-325 MG tablet Take 1 tablet by mouth every 6 (six) hours as needed for moderate pain. Do not take with percocet 01/20/17  Yes Ladell Pier, MD  Insulin Detemir (LEVEMIR FLEXTOUCH) 100 UNIT/ML Pen Inject 25 Units into the skin at bedtime. 01/17/17  Yes [name], MD  insulin regular (NOVOLIN R RELION) 100 units/mL injection Inject 5 Units into the skin 3 (three) times daily before meals.  12/27/16  Yes [name], MD  Multiple Vitamin (MULTIVITAMIN) tablet Take 1 tablet by mouth daily.     Yes [provider]  Omega-3 Fatty Acids (FISH OIL) 1200 MG CAPS Take 1,200 mg by mouth daily.     Yes [provider]  oxyCODONE (OXYCONTIN) 10  mg 12 hr tablet Take 1 tablet (10 mg total) by mouth every 12 (twelve) hours. 01/07/17  Yes Owens Shark, NP  oxyCODONE-acetaminophen (PERCOCET/ROXICET) 5-325 MG tablet Take 1-2 tablets by mouth every 6 (six) hours as needed for severe pain. 01/26/17  Yes Ladell Pier, MD  promethazine (PHENERGAN) 25 MG tablet Take 0.5 tablets (12.5 mg total) by mouth every 8 (eight) hours as needed for nausea or vomiting. 01/26/17  Yes Ladell Pier, MD  simethicone (MYLICON) 710 MG chewable tablet Chew 125 mg by mouth every 6 (six) hours as needed for  flatulence.   Yes [provider]  vitamin C (ASCORBIC ACID) 500 MG tablet Take 500 mg by mouth daily.     Yes [provider]  warfarin (JANTOVEN) 5 MG tablet TAKE AS DIRECTED BY        COUMADIN CLINIC Patient taking differently: 7.5 MG on Sunday, Tuesday, Thursday, Saturday, 5 MG on Monday, Wednesday, Friday 01/07/17  Yes [name], MD  glucose blood (ACCU-CHEK AVIVA) test strip Use to check sugars TID and as needed for E11.65, insulin dependent 01/06/17   [name], MD  Insulin Pen Needle 31G X 5 MM MISC Use to inject insulin QID as directed 01/06/17   [name], MD  ondansetron (ZOFRAN) 4 MG tablet Take 1 tablet (4 mg total) by mouth every 6 (six) hours as needed for nausea or vomiting. 01/07/17   Owens Shark, NP    Family History Family History  Problem Relation Age of Onset  . Pulmonary embolism Maternal Grandfather   . Heart attack Father 57       CBAG X1  . Deep vein thrombosis Father   . Diabetes Father   . Uterine cancer Mother   . Hypertension Mother   . Hyperlipidemia Mother   . Heart disease Mother        3 vessel CBAG @ 64  . Diabetes Brother   . Heart attack Brother 39       Diabetic  . Stroke Neg Hx     Social History Social History   Tobacco Use  . Smoking status: Never Smoker  . Smokeless tobacco: Never Used  Substance Use Topics  . Alcohol use: No  . Drug use: No     Allergies   Niacin; Crestor [rosuvastatin calcium]; and Codeine   Review of Systems Review of Systems  Gastrointestinal: Positive for abdominal pain.  All other systems reviewed and are negative.    Physical Exam Updated Vital Signs BP 121/79 (BP Location: Left Arm)   Pulse (!) 101   Temp 99.1 F (37.3 C) (Oral)   Resp 19   Wt 65.8 kg (145 lb)   SpO2 97%   BMI 22.21 kg/m   Physical Exam  Constitutional: He is oriented to person, place, and time. He appears well-developed and well-nourished. He appears distressed.  HENT:  Head:  Normocephalic and atraumatic.  Cardiovascular: Normal rate and regular rhythm.  No murmur heard. Pulmonary/Chest: Effort normal and breath sounds normal. No respiratory distress.  Abdominal:  Moderate generalized abdominal tenderness, greatest over the right upper quadrant.  Unable to lay supine secondary to abdominal pain.  Musculoskeletal: He exhibits no edema or tenderness.  Neurological: He is alert and oriented to person, place, and time.  Skin: Skin is warm and dry.  Psychiatric: He has a normal mood and affect. His behavior is normal.  Nursing note and vitals reviewed.    ED Treatments / Results  Labs (all  labs ordered are listed, but only abnormal results are displayed) Labs Reviewed  COMPREHENSIVE METABOLIC PANEL - Abnormal; Notable for the following components:      Result Value   Chloride 98 (*)    Glucose, Bld 152 (*)    AST 69 (*)    Alkaline Phosphatase 140 (*)    All other components within normal limits  CBC WITH DIFFERENTIAL/PLATELET - Abnormal; Notable for the following components:   RBC 3.54 (*)    Hemoglobin 10.3 (*)    HCT 30.6 (*)    Platelets 130 (*)    All other components within normal limits  LIPASE, BLOOD  PROTIME-INR    EKG  EKG Interpretation None       Radiology Ct Abdomen Pelvis W Contrast  Result Date: 02/04/2017 CLINICAL DATA:  New onset right upper abdominal pain. History of pancreatic cancer. EXAM: CT ABDOMEN AND PELVIS WITH CONTRAST TECHNIQUE: Multidetector CT imaging of the abdomen and pelvis was performed using the standard protocol following bolus administration of intravenous contrast. CONTRAST:  138mL ISOVUE-300 IOPAMIDOL (ISOVUE-300) INJECTION 61% COMPARISON:  CT abdomen dated 12/24/2016. FINDINGS: Lower chest: No significant findings.  Mild bibasilar atelectasis. Hepatobiliary: Significant interval progression of the hepatic metastases. All lesions have enlarged in the interval. Largest lesion now measures 9 cm, previously  approximately 6 cm. Gallbladder is mildly distended but otherwise unremarkable. No bile duct dilatation. Pancreas: The area of masslike low-density within the pancreatic head/neck region is grossly stable, measuring approximately 6 cm extent. The adjacent cystic mass anterior to the pancreas is slightly larger, measuring 3.4 cm greatest dimension on today's study (previously 3 cm). The previously measured peripancreatic lymph node, just above the level of the pancreas, is not significantly changed in the interval. Spleen: Normal in size without focal abnormality. Adrenals/Urinary Tract: Adrenal glands appear normal. Kidneys are unremarkable without mass, stone or hydronephrosis. Bladder appears normal. Stomach/Bowel: Bowel is normal in caliber. No bowel wall thickening or evidence of bowel wall inflammation seen. Appendix appears normal. Vascular/Lymphatic: Aortic atherosclerosis. No acute appearing vascular abnormality. Persistent occlusion of the splenic vein. Conglomerate lymphadenopathy within the periaortic retroperitoneum, most prominent below the level of the renal veins, appears grossly stable. Overall peripancreatic lymphadenopathy is similar in extent. Reproductive: Prostate is unremarkable. Other: Trace free fluid in the lower pelvis. No abscess collection seen. No free intraperitoneal air. Musculoskeletal: No acute or suspicious osseous finding. IMPRESSION: 1. Significant interval progression of the hepatic metastases. All hepatic masses have enlarged compared to the CT of 12/24/2016, largest lesion now measuring 9 cm greatest dimension. 2. Poorly defined pancreatic mass, presumed pancreatic carcinoma, appears grossly stable. 3. Conglomerate metastatic lymphadenopathy within the retroperitoneum appears stable. Overall, the peripancreatic lymphadenopathy also appears stable. 4. The cystic mass anterior to the pancreas, presumably associated with the underlying pancreatic cancer, has slightly enlarged with  a current measurement of 3.4 cm (previously 3 cm). 5. 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 Electronically Signed   By: Franki Cabot M.D.   On: 02/04/2017 00:04    Procedures Procedures (including critical care time)  Medications Ordered in ED Medications  HYDROmorphone (DILAUDID) injection 1 mg (1 mg Intravenous Given 02/03/17 2232)  HYDROmorphone (DILAUDID) injection 1 mg (1 mg Intravenous Given 02/03/17 2256)  iopamidol (ISOVUE-300) 61 % injection (100 mLs  Contrast Given 02/03/17 2337)     Initial Impression / Assessment and Plan / ED Course  I have reviewed the triage vital signs and the nursing notes.  Pertinent labs & imaging results that were available during my care of the patient were reviewed by me and considered in my medical decision making (see chart for details).     Patient with metastatic pancreatic cancer here for evaluation of acute onset severe right upper quadrant abdominal pain.  He is extremely uncomfortable on examination and unable to lay flat secondary to pain.  He was treated with multiple doses of Dilaudid with partial improvement in his symptoms.  CT abdomen does demonstrate progression of his metastatic disease but no complicating features such as hemorrhage or abscess.  Discussed with patient treatment options of increasing his home pain medications versus admission for symptom management.  Patient is fearful of discharge home at this time.  Medicine consulted for observation admission for pain control.  Final Clinical Impressions(s) / ED Diagnoses   Final diagnoses:  None    ED Discharge Orders    None       Quintella Reichert, MD 02/04/17 [phone]

## 2017-02-04 ENCOUNTER — Other Ambulatory Visit: Payer: Self-pay

## 2017-02-04 ENCOUNTER — Encounter (HOSPITAL_COMMUNITY): Payer: Self-pay | Admitting: Internal Medicine

## 2017-02-04 DIAGNOSIS — R109 Unspecified abdominal pain: Secondary | ICD-10-CM | POA: Diagnosis present

## 2017-02-04 DIAGNOSIS — E119 Type 2 diabetes mellitus without complications: Secondary | ICD-10-CM | POA: Diagnosis not present

## 2017-02-04 DIAGNOSIS — G893 Neoplasm related pain (acute) (chronic): Secondary | ICD-10-CM | POA: Diagnosis not present

## 2017-02-04 DIAGNOSIS — R1011 Right upper quadrant pain: Secondary | ICD-10-CM | POA: Diagnosis not present

## 2017-02-04 DIAGNOSIS — Z86718 Personal history of other venous thrombosis and embolism: Secondary | ICD-10-CM

## 2017-02-04 DIAGNOSIS — Z7901 Long term (current) use of anticoagulants: Secondary | ICD-10-CM

## 2017-02-04 DIAGNOSIS — E1165 Type 2 diabetes mellitus with hyperglycemia: Secondary | ICD-10-CM

## 2017-02-04 DIAGNOSIS — D638 Anemia in other chronic diseases classified elsewhere: Secondary | ICD-10-CM | POA: Diagnosis not present

## 2017-02-04 DIAGNOSIS — E118 Type 2 diabetes mellitus with unspecified complications: Secondary | ICD-10-CM

## 2017-02-04 DIAGNOSIS — R634 Abnormal weight loss: Secondary | ICD-10-CM

## 2017-02-04 DIAGNOSIS — Z86711 Personal history of pulmonary embolism: Secondary | ICD-10-CM

## 2017-02-04 DIAGNOSIS — C787 Secondary malignant neoplasm of liver and intrahepatic bile duct: Secondary | ICD-10-CM

## 2017-02-04 DIAGNOSIS — C258 Malignant neoplasm of overlapping sites of pancreas: Secondary | ICD-10-CM | POA: Diagnosis not present

## 2017-02-04 DIAGNOSIS — C259 Malignant neoplasm of pancreas, unspecified: Secondary | ICD-10-CM | POA: Diagnosis present

## 2017-02-04 DIAGNOSIS — R52 Pain, unspecified: Secondary | ICD-10-CM | POA: Diagnosis present

## 2017-02-04 LAB — GLUCOSE, CAPILLARY
GLUCOSE-CAPILLARY: 91 mg/dL (ref 65–99)
GLUCOSE-CAPILLARY: 177 mg/dL — AB (ref 65–99)
Glucose-Capillary: 162 mg/dL — ABNORMAL HIGH (ref 65–99)
Glucose-Capillary: 219 mg/dL — ABNORMAL HIGH (ref 65–99)

## 2017-02-04 LAB — CBC
HCT: 28.9 % — ABNORMAL LOW (ref 39.0–52.0)
HEMOGLOBIN: 9.7 g/dL — AB (ref 13.0–17.0)
MCH: 29.1 pg (ref 26.0–34.0)
MCHC: 33.6 g/dL (ref 30.0–36.0)
MCV: 86.8 fL (ref 78.0–100.0)
PLATELETS: 123 10*3/uL — AB (ref 150–400)
RBC: 3.33 MIL/uL — ABNORMAL LOW (ref 4.22–5.81)
RDW: 13.5 % (ref 11.5–15.5)
WBC: 6.1 10*3/uL (ref 4.0–10.5)

## 2017-02-04 LAB — BASIC METABOLIC PANEL
Anion gap: 7 (ref 5–15)
BUN: 15 mg/dL (ref 6–20)
CO2: 30 mmol/L (ref 22–32)
CREATININE: 0.85 mg/dL (ref 0.61–1.24)
Calcium: 8.8 mg/dL — ABNORMAL LOW (ref 8.9–10.3)
Chloride: 99 mmol/L — ABNORMAL LOW (ref 101–111)
GFR calc Af Amer: >60 mL/min (ref >60)
GLUCOSE: 92 mg/dL (ref 65–99)
Potassium: 4.4 mmol/L (ref 3.5–5.1)
Sodium: 136 mmol/L (ref 135–145)

## 2017-02-04 LAB — PROTIME-INR
INR: 1.6
PROTHROMBIN TIME: 18.9 s — AB (ref 11.4–15.2)

## 2017-02-04 LAB — HIV ANTIBODY (ROUTINE TESTING W REFLEX): HIV SCREEN 4TH GENERATION: NONREACTIVE

## 2017-02-04 MED ORDER — SODIUM CHLORIDE 0.9 % IV SOLN: INTRAVENOUS | Status: DC

## 2017-02-04 MED ORDER — ONDANSETRON HCL 4 MG/2ML IJ SOLN
4.0000 mg | Freq: Four times a day (QID) | INTRAMUSCULAR | Status: DC | PRN
  Administered 2017-02-04 (×2): 4 mg via INTRAVENOUS
  Filled 2017-02-04 (×3): qty 2

## 2017-02-04 MED ORDER — WARFARIN SODIUM 5 MG PO TABS
10.0000 mg | ORAL_TABLET | ORAL | Status: AC
  Administered 2017-02-04: 10 mg via ORAL
  Filled 2017-02-04: qty 2

## 2017-02-04 MED ORDER — OXYCODONE HCL ER 20 MG PO T12A: 20.0000 mg | EXTENDED_RELEASE_TABLET | Freq: Two times a day (BID) | ORAL | Status: DC

## 2017-02-04 MED ORDER — OXYCODONE HCL ER 15 MG PO T12A
15.0000 mg | EXTENDED_RELEASE_TABLET | Freq: Two times a day (BID) | ORAL | Status: DC
  Administered 2017-02-04: 15 mg via ORAL
  Filled 2017-02-04: qty 1

## 2017-02-04 MED ORDER — INSULIN ASPART 100 UNIT/ML ~~LOC~~ SOLN
0.0000 [IU] | Freq: Three times a day (TID) | SUBCUTANEOUS | Status: DC
  Administered 2017-02-04 – 2017-02-05 (×2): 3 [IU] via SUBCUTANEOUS
  Administered 2017-02-05: 2 [IU] via SUBCUTANEOUS

## 2017-02-04 MED ORDER — FENTANYL CITRATE (PF) 100 MCG/2ML IJ SOLN
50.0000 ug | INTRAMUSCULAR | Status: DC | PRN
  Administered 2017-02-04 (×6): 50 ug via INTRAVENOUS
  Filled 2017-02-04 (×6): qty 2

## 2017-02-04 MED ORDER — HYDROMORPHONE HCL 1 MG/ML IJ SOLN
1.0000 mg | INTRAMUSCULAR | Status: DC | PRN
  Administered 2017-02-04 (×3): 1 mg via INTRAVENOUS
  Filled 2017-02-04 (×3): qty 1

## 2017-02-04 MED ORDER — OXYCODONE HCL ER 20 MG PO T12A
20.0000 mg | EXTENDED_RELEASE_TABLET | Freq: Two times a day (BID) | ORAL | Status: DC
  Administered 2017-02-04 – 2017-02-05 (×2): 20 mg via ORAL
  Filled 2017-02-04 (×2): qty 1

## 2017-02-04 MED ORDER — FOLIC ACID 1 MG PO TABS
1.0000 mg | ORAL_TABLET | Freq: Every day | ORAL | Status: DC
  Administered 2017-02-04 – 2017-02-05 (×2): 1 mg via ORAL
  Filled 2017-02-04 (×2): qty 1

## 2017-02-04 MED ORDER — SENNOSIDES-DOCUSATE SODIUM 8.6-50 MG PO TABS
2.0000 | ORAL_TABLET | Freq: Two times a day (BID) | ORAL | Status: DC
  Administered 2017-02-04 – 2017-02-05 (×3): 2 via ORAL
  Filled 2017-02-04 (×3): qty 2

## 2017-02-04 MED ORDER — ONDANSETRON HCL 4 MG/2ML IJ SOLN
4.0000 mg | Freq: Once | INTRAMUSCULAR | Status: AC | PRN
  Administered 2017-02-04: 4 mg via INTRAVENOUS
  Filled 2017-02-04: qty 2

## 2017-02-04 MED ORDER — INSULIN DETEMIR 100 UNIT/ML ~~LOC~~ SOLN
20.0000 [IU] | Freq: Every day | SUBCUTANEOUS | Status: DC
  Administered 2017-02-04: 20 [IU] via SUBCUTANEOUS
  Filled 2017-02-04 (×2): qty 0.2

## 2017-02-04 MED ORDER — WARFARIN - PHARMACIST DOSING INPATIENT: Freq: Every day | Status: DC

## 2017-02-04 MED ORDER — PROMETHAZINE HCL 25 MG/ML IJ SOLN
12.5000 mg | Freq: Once | INTRAMUSCULAR | Status: AC
  Administered 2017-02-04: 12.5 mg via INTRAVENOUS
  Filled 2017-02-04: qty 1

## 2017-02-04 MED ORDER — ACETAMINOPHEN 650 MG RE SUPP: 650.0000 mg | Freq: Four times a day (QID) | RECTAL | Status: DC | PRN

## 2017-02-04 MED ORDER — OXYCODONE HCL ER 20 MG PO T12A: 20.0000 mg | EXTENDED_RELEASE_TABLET | Freq: Three times a day (TID) | ORAL | Status: DC

## 2017-02-04 MED ORDER — ONDANSETRON HCL 4 MG PO TABS: 4.0000 mg | ORAL_TABLET | Freq: Four times a day (QID) | ORAL | Status: DC | PRN

## 2017-02-04 MED ORDER — ACETAMINOPHEN 325 MG PO TABS: 650.0000 mg | ORAL_TABLET | Freq: Four times a day (QID) | ORAL | Status: DC | PRN

## 2017-02-04 MED ORDER — OXYCODONE HCL 5 MG PO TABS
5.0000 mg | ORAL_TABLET | ORAL | Status: DC | PRN
  Administered 2017-02-04: 10 mg via ORAL
  Administered 2017-02-05 (×4): 5 mg via ORAL
  Filled 2017-02-04: qty 1
  Filled 2017-02-04: qty 2
  Filled 2017-02-04 (×3): qty 1

## 2017-02-04 NOTE — Progress Notes (Signed)
IP PROGRESS NOTE  Subjective:   Evan Cabrera developed increased right abdomen pain yesterday and presented to the emergency room.  He reports starting OxyContin yesterday morning.  The pain has increased over recent days and is not relieved with hydrocodone.  He takes oxycodone at night.  He is having bowel movements. [name] enrolled in the [facility] hospice program, but discontinued hospice care secondary to a high insurance co-pay.  He was treated with Dilaudid in the emergency room.  A CT of the abdomen feel progression of hepatic metastases.  Stable pancreas mass lymphadenopathy. He reports adequate pain control with IV pain medication.  Objective: Vital signs in last 24 hours: Blood pressure 107/66, pulse 68, temperature 98.6 F (37 C), temperature source Oral, resp. rate 16, height '5\' 6"'$  (1.676 m), weight 145 lb (65.8 kg), SpO2 96 %.  Intake/Output from previous day: No intake/output data recorded.  Physical Exam:  Lungs: Clear bilaterally Cardiac: Regular rate and rhythm Abdomen: No mass, no hepatomegaly, tender in the right upper abdomen Extremities: No leg edema    Lab Results: Recent Labs    02/03/17 2228 02/04/17 0500  WBC 6.4 6.1  HGB 10.3* 9.7*  HCT 30.6* 28.9*  PLT 130* 123*    BMET Recent Labs    02/03/17 2228 02/04/17 0500  NA 135 136  K 4.2 4.4  CL 98* 99*  CO2 29 30  GLUCOSE 152* 92  BUN 15 15  CREATININE 0.79 0.85  CALCIUM 8.9 8.8*     Studies/Results: Ct Abdomen Pelvis W Contrast  Result Date: 02/04/2017 CLINICAL DATA:  New onset right upper abdominal pain. History of pancreatic cancer. EXAM: CT ABDOMEN AND PELVIS WITH CONTRAST TECHNIQUE: Multidetector CT imaging of the abdomen and pelvis was performed using the standard protocol following bolus administration of intravenous contrast. CONTRAST:  153m ISOVUE-300 IOPAMIDOL (ISOVUE-300) INJECTION 61% COMPARISON:  CT abdomen dated 12/24/2016. FINDINGS: Lower chest: No significant findings.   Mild bibasilar atelectasis. Hepatobiliary: Significant interval progression of the hepatic metastases. All lesions have enlarged in the interval. Largest lesion now measures 9 cm, previously approximately 6 cm. Gallbladder is mildly distended but otherwise unremarkable. No bile duct dilatation. Pancreas: The area of masslike low-density within the pancreatic head/neck region is grossly stable, measuring approximately 6 cm extent. The adjacent cystic mass anterior to the pancreas is slightly larger, measuring 3.4 cm greatest dimension on today's study (previously 3 cm). The previously measured peripancreatic lymph node, just above the level of the pancreas, is not significantly changed in the interval. Spleen: Normal in size without focal abnormality. Adrenals/Urinary Tract: Adrenal glands appear normal. Kidneys are unremarkable without mass, stone or hydronephrosis. Bladder appears normal. Stomach/Bowel: Bowel is normal in caliber. No bowel wall thickening or evidence of bowel wall inflammation seen. Appendix appears normal. Vascular/Lymphatic: Aortic atherosclerosis. No acute appearing vascular abnormality. Persistent occlusion of the splenic vein. Conglomerate lymphadenopathy within the periaortic retroperitoneum, most prominent below the level of the renal veins, appears grossly stable. Overall peripancreatic lymphadenopathy is similar in extent. Reproductive: Prostate is unremarkable. Other: Trace free fluid in the lower pelvis. No abscess collection seen. No free intraperitoneal air. Musculoskeletal: No acute or suspicious osseous finding. IMPRESSION: 1. Significant interval progression of the hepatic metastases. All hepatic masses have enlarged compared to the CT of 12/24/2016, largest lesion now measuring 9 cm greatest dimension. 2. Poorly defined pancreatic mass, presumed pancreatic carcinoma, appears grossly stable. 3. Conglomerate metastatic lymphadenopathy within the retroperitoneum appears stable.  Overall, the peripancreatic lymphadenopathy also appears stable. 4. The  cystic mass anterior to the pancreas, presumably associated with the underlying pancreatic cancer, has slightly enlarged with a current measurement of 3.4 cm (previously 3 cm). 5. 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 Electronically Signed   By: Franki Cabot M.D.   On: 02/04/2017 00:04    Medications: I have reviewed the patient's current medications.  Assessment/Plan:  1. Pancreas neck/body mass, numerous liver lesions, peripancreatic and retroperitoneal adenopathy on CT 12/24/2016.  CT 02/04/2017-stable pancreas mass/adenopathy, progressive liver metastases 2. Abdominal/back pain secondary to #-progressive, likely secondary to progressive liver metastases. 3. Weight loss secondary to #1. 4. Diabetes mellitus. 5. Factor V Leiden gene mutation. 6. History of elevated homocystine. 7. 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  Evan Cabrera has metastatic pancreas cancer.  He has decided against treatment of the pancreas cancer.  He was briefly enrolled in the Hospice program, but true from hospice care secondary to a lack of insurance coverage.  He has likely now met his insurance deductible.  We will make a referral to the [facility] program.  He was admitted with increased pain secondary to progression of pancreas cancer.  I recommend continuing OxyContin at a dose of 20 mg every 12 hours (he has OxyContin at home).  He can try oxycodone for breakthrough pain.  If the oxycodone does not help he can be converted to Dilaudid for breakthrough pain.  It will be difficult to manage Coumadin anticoagulation in the setting of metastatic pancreas cancer.  We can consider discontinuing anticoagulation therapy or switching to  Lovenox.  Outpatient follow-up will be scheduled at the Cancer center for within the next few weeks.     LOS: 0 days   Betsy Coder, MD   02/04/2017, 4:04 PM

## 2017-02-04 NOTE — Progress Notes (Signed)
Nutrition Brief Note  Chart reviewed. Pt opting for comfort care only.  No further nutrition interventions warranted at this time.  Please re-consult as needed.   Mariana Single RD, LDN Clinical Nutrition Pager # [phone]

## 2017-02-04 NOTE — Progress Notes (Signed)
ANTICOAGULATION CONSULT NOTE - Initial Consult  Pharmacy Consult for Warfarin Indication: pulmonary embolus, DVT and Factor 5 Leiden mutation  Allergies  Allergen Reactions  . Niacin     REACTION: rash, itching, hot  . Crestor [Rosuvastatin Calcium]     Memory loss  . Codeine     nausea    Patient Measurements: Height: 5\' 6"  (167.6 cm) Weight: 145 lb (65.8 kg) IBW/kg (Calculated) : 63.8 Heparin Dosing Weight:   Vital Signs: Temp: 98 F (36.7 C) (02/01 0631) Temp Source: Oral (02/01 0631) BP: 123/67 (02/01 0631) Pulse Rate: 73 (02/01 0631)  Labs: Recent Labs    02/03/17 2228 02/04/17 0008 02/04/17 0500  HGB 10.3*  --  9.7*  HCT 30.6*  --  28.9*  PLT 130*  --  123*  LABPROT  --  18.9*  --   INR  --  1.60  --   CREATININE 0.79  --   --     Estimated Creatinine Clearance: 90.8 mL/min (by C-G formula based on SCr of 0.79 mg/dL).   Medical History: Past Medical History:  Diagnosis Date  . Benign prostatic hypertrophy history  . Depression 2005  . DVT (deep venous thrombosis) ([place]) 2005  . Elevated homocysteine ([place])   . Factor 5 Leiden mutation, heterozygous ([place])   . GERD (gastroesophageal reflux disease)   . Hyperlipemia   . Pulmonary embolism ([place]) [id number]  . 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 Type 2 diabetes mellitus ([facility])     Medications:  Medications Prior to Admission  Medication Sig Dispense Refill Last Dose  . esomeprazole (NEXIUM) 40 MG capsule Take 40 mg by mouth once.   02/03/2017 at Unknown time  . folic acid (FOLVITE) 709 MCG tablet Take 800 mcg by mouth daily.     02/03/2017 at Unknown time  . HYDROcodone-acetaminophen (NORCO/VICODIN) 5-325 MG tablet Take 1 tablet by mouth every 6 (six) hours as needed for moderate pain. Do not take with percocet 100 tablet 0 02/03/2017 at 1000  . Insulin Detemir (LEVEMIR FLEXTOUCH) 100 UNIT/ML Pen Inject 25 Units into the skin at bedtime. 27 mL 1 02/03/2017 at Unknown time  . insulin regular  (NOVOLIN R RELION) 100 units/mL injection Inject 5 Units into the skin 3 (three) times daily before meals.    02/03/2017 at Unknown time  . Multiple Vitamin (MULTIVITAMIN) tablet Take 1 tablet by mouth daily.     02/03/2017 at Unknown time  . Omega-3 Fatty Acids (FISH OIL) 1200 MG CAPS Take 1,200 mg by mouth daily.     02/03/2017 at Unknown time  . oxyCODONE (OXYCONTIN) 10 mg 12 hr tablet Take 1 tablet (10 mg total) by mouth every 12 (twelve) hours. 60 tablet 0 02/03/2017 at 1900  . oxyCODONE-acetaminophen (PERCOCET/ROXICET) 5-325 MG tablet Take 1-2 tablets by mouth every 6 (six) hours as needed for severe pain. 60 tablet 0 02/02/2017 at PM  . promethazine (PHENERGAN) 25 MG tablet Take 0.5 tablets (12.5 mg total) by mouth every 8 (eight) hours as needed for nausea or vomiting. 30 tablet 1 02/03/2017 at 1530  . simethicone (MYLICON) 628 MG chewable tablet Chew 125 mg by mouth every 6 (six) hours as needed for flatulence.   02/03/2017 at Unknown time  . vitamin C (ASCORBIC ACID) 500 MG tablet Take 500 mg by mouth daily.     02/03/2017 at Unknown time  . warfarin (JANTOVEN) 5 MG tablet TAKE AS DIRECTED BY  [facility]IN CL[facility]IC (Patient taking differently: 7.5 MG on Sunday, Tuesday, Thursday, Saturday, 5 MG on Monday, Wednesday, Friday) 180 tablet 1 02/03/2017 at 1800  . glucose blood (ACCU-CHEK AVIVA) test strip Use to check sugars TID and as needed for E11.65, insulin dependent 300 each 3 Taking  . Insulin Pen Needle 31G X 5 MM MISC Use to inject insulin QID as directed 400 each 3 Taking  . ondansetron (ZOFRAN) 4 MG tablet Take 1 tablet (4 mg total) by mouth every 6 (six) hours as needed for nausea or vomiting. 30 tablet 0    Scheduled:  . folic acid  1 mg Oral Daily  . insulin aspart  0-9 Units Subcutaneous TID WC  . insulin detemir  20 Units Subcutaneous QHS  . oxyCODONE  15 mg Oral Q12H  . warfarin  10 mg Oral NOW  . Warfarin - Pharmacist Dosing Inpatient   Does not apply q1800    Assessment: Patient  with history of pulmonary embolus, DVT and Factor 5 Leiden mutation on warfarin prior to admission.  Prior INR goal noted to be 2.5-3.5.  INR on admit 1.6.  Last dose warfarin noted 1/31 at 1800 per med rec.    Goal of Therapy:  INR 2.5-3.5    Plan:  Daily INR Warfarin 10mg  po x1 now (given) Follow up with INR on 2/2 AM labs  Nani Skillern [place] 02/04/2017,6:44 AM

## 2017-02-04 NOTE — ED Notes (Signed)
ED TO INPATIENT HANDOFF REPORT  Name/Age/Gender Evan Cabrera 57 y.o. male  Code Status    Code Status Orders  (From admission, onward)        Start     Ordered   02/04/17 0107  Do not attempt resuscitation (DNR)  Continuous    Question Answer Comment  In the event of cardiac or respiratory ARREST Do not call a "code blue"   In the event of cardiac or respiratory ARREST Do not perform Intubation, CPR, defibrillation or ACLS   In the event of cardiac or respiratory ARREST Use medication by any route, position, wound care, and other measures to relive pain and suffering. May use oxygen, suction and manual treatment of airway obstruction as needed for comfort.      02/04/17 0107    Code Status History    Date Active Date Inactive Code Status Order ID Comments User Context   06/12/2015 01:32 06/13/2015 19:40 Full Code [id number]  Evan Bulls, MD ED      Home/SNF/Other Home  Chief Complaint Ca pt (pancreatic), abdominal pain  Level of Care/Admitting Diagnosis ED Disposition    ED Disposition Condition [place] [facility] Area: [facility] [[id number]]  Level of Care: Med-Surg [16]  Diagnosis: Abdominal pain [962229]  Admitting Physician: Evan Cabrera [phone]  Attending Physician: Evan Cabrera [phone]  PT Class (Do Not Modify): Observation [104]  PT Acc Code (Do Not Modify): Observation [10022]       Medical History Past Medical History:  Diagnosis Date  . Benign prostatic hypertrophy history  . Depression 2005  . DVT (deep venous thrombosis) ([place]) 2005  . Elevated homocysteine ([place])   . Factor 5 Leiden mutation, heterozygous ([place])   . GERD (gastroesophageal reflux disease)   . Hyperlipemia   . Pulmonary embolism ([place]) [id number]  . 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 Type 2 diabetes mellitus (HCC)     Allergies Allergies  Allergen Reactions  . Niacin     REACTION: rash, itching, hot  . Crestor [Rosuvastatin  Calcium]     Memory loss  . Codeine     nausea    IV Location/Drains/Wounds Patient Lines/Drains/Airways Status   Active Line/Drains/Airways    Name:   Placement date:   Placement time:   Site:   Days:   Peripheral IV 12/24/16 Left Antecubital   12/24/16    1449    Antecubital   42          Labs/Imaging Results for orders placed or performed during the hospital encounter of 02/03/17 (from the past 48 hour(s))  Comprehensive metabolic panel     Status: Abnormal   Collection Time: 02/03/17 10:28 PM  Result Value Ref Range   Sodium 135 135 - 145 mmol/L   Potassium 4.2 3.5 - 5.1 mmol/L   Chloride 98 (L) 101 - 111 mmol/L   CO2 29 22 - 32 mmol/L   Glucose, Bld 152 (H) 65 - 99 mg/dL   BUN 15 6 - 20 mg/dL   Creatinine, Ser 0.79 0.61 - 1.24 mg/dL   Calcium 8.9 8.9 - 10.3 mg/dL   Total Protein 7.2 6.5 - 8.1 g/dL   Albumin 3.5 3.5 - 5.0 g/dL   AST 69 (H) 15 - 41 U/L   ALT 23 17 - 63 U/L   Alkaline Phosphatase 140 (H) 38 - 126 U/L   Total Bilirubin 0.4 0.3 - 1.2 mg/dL  GFR calc non Af Amer >60 >60 mL/min   GFR calc Af Amer >60 >60 mL/min    Comment: (NOTE) The eGFR has been calculated using the CKD EPI equation. This calculation has not been validated in all clinical situations. eGFR's persistently <60 mL/min signify possible Chronic Kidney Disease.    Anion gap 8 5 - 15  Lipase, blood     Status: None   Collection Time: 02/03/17 10:28 PM  Result Value Ref Range   Lipase 23 11 - 51 U/L  CBC with Differential     Status: Abnormal   Collection Time: 02/03/17 10:28 PM  Result Value Ref Range   WBC 6.4 4.0 - 10.5 K/uL   RBC 3.54 (L) 4.22 - 5.81 MIL/uL   Hemoglobin 10.3 (L) 13.0 - 17.0 g/dL   HCT 30.6 (L) 39.0 - 52.0 %   MCV 86.4 78.0 - 100.0 fL   MCH 29.1 26.0 - 34.0 pg   MCHC 33.7 30.0 - 36.0 g/dL   RDW 13.4 11.5 - 15.5 %   Platelets 130 (L) 150 - 400 K/uL   Neutrophils Relative % 68 %   Neutro Abs 4.3 1.7 - 7.7 K/uL   Lymphocytes Relative 19 %   Lymphs Abs 1.2 0.7 -  4.0 K/uL   Monocytes Relative 12 %   Monocytes Absolute 0.8 0.1 - 1.0 K/uL   Eosinophils Relative 1 %   Eosinophils Absolute 0.1 0.0 - 0.7 K/uL   Basophils Relative 0 %   Basophils Absolute 0.0 0.0 - 0.1 K/uL  Protime-INR     Status: Abnormal   Collection Time: 02/04/17 12:08 AM  Result Value Ref Range   Prothrombin Time 18.9 (H) 11.4 - 15.2 seconds   INR 1.60    Ct Abdomen Pelvis W Contrast  Result Date: 02/04/2017 CLINICAL DATA:  New onset right upper abdominal pain. History of pancreatic cancer. EXAM: CT ABDOMEN AND PELVIS WITH CONTRAST TECHNIQUE: Multidetector CT imaging of the abdomen and pelvis was performed using the standard protocol following bolus administration of intravenous contrast. CONTRAST:  134m ISOVUE-300 IOPAMIDOL (ISOVUE-300) INJECTION 61% COMPARISON:  CT abdomen dated 12/24/2016. FINDINGS: Lower chest: No significant findings.  Mild bibasilar atelectasis. Hepatobiliary: Significant interval progression of the hepatic metastases. All lesions have enlarged in the interval. Largest lesion now measures 9 cm, previously approximately 6 cm. Gallbladder is mildly distended but otherwise unremarkable. No bile duct dilatation. Pancreas: The area of masslike low-density within the pancreatic head/neck region is grossly stable, measuring approximately 6 cm extent. The adjacent cystic mass anterior to the pancreas is slightly larger, measuring 3.4 cm greatest dimension on today's study (previously 3 cm). The previously measured peripancreatic lymph node, just above the level of the pancreas, is not significantly changed in the interval. Spleen: Normal in size without focal abnormality. Adrenals/Urinary Tract: Adrenal glands appear normal. Kidneys are unremarkable without mass, stone or hydronephrosis. Bladder appears normal. Stomach/Bowel: Bowel is normal in caliber. No bowel wall thickening or evidence of bowel wall inflammation seen. Appendix appears normal. Vascular/Lymphatic: Aortic  atherosclerosis. No acute appearing vascular abnormality. Persistent occlusion of the splenic vein. Conglomerate lymphadenopathy within the periaortic retroperitoneum, most prominent below the level of the renal veins, appears grossly stable. Overall peripancreatic lymphadenopathy is similar in extent. Reproductive: Prostate is unremarkable. Other: Trace free fluid in the lower pelvis. No abscess collection seen. No free intraperitoneal air. Musculoskeletal: No acute or suspicious osseous finding. IMPRESSION: 1. Significant interval progression of the hepatic metastases. All hepatic masses have enlarged compared to the  CT of 12/24/2016, largest lesion now measuring 9 cm greatest dimension. 2. Poorly defined pancreatic mass, presumed pancreatic carcinoma, appears grossly stable. 3. Conglomerate metastatic lymphadenopathy within the retroperitoneum appears stable. Overall, the peripancreatic lymphadenopathy also appears stable. 4. The cystic mass anterior to the pancreas, presumably associated with the underlying pancreatic cancer, has slightly enlarged with a current measurement of 3.4 cm (previously 3 cm). 5. 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 Electronically Signed   By: Franki Cabot M.D.   On: 02/04/2017 00:04    Pending Labs Unresulted Labs (From admission, onward)   Start     Ordered   02/04/17 0500  HIV antibody (Routine Testing)  Tomorrow morning,   R     02/04/17 0107   02/04/17 0737  Basic metabolic panel  Tomorrow morning,   R     02/04/17 0107   02/04/17 0500  CBC  Tomorrow morning,   R     02/04/17 0107      Vitals/Pain Today's Vitals   02/04/17 0300 02/04/17 0328 02/04/17 0330 02/04/17 0400  BP: 108/71  109/67 108/65  Pulse: 62  65 71  Resp: _0 Temp:      TempSrc:      SpO2: 93%  96% 96%  Weight:      PainSc:  3       Isolation Precautions No  active isolations  Medications Medications  folic acid (FOLVITE) tablet 1 mg (not administered)  acetaminophen (TYLENOL) tablet 650 mg (not administered)    Or  acetaminophen (TYLENOL) suppository 650 mg (not administered)  ondansetron (ZOFRAN) tablet 4 mg (not administered)    Or  ondansetron (ZOFRAN) injection 4 mg (not administered)  insulin aspart (novoLOG) injection 0-9 Units (not administered)  fentaNYL (SUBLIMAZE) injection 50 mcg (50 mcg Intravenous Given 02/04/17 0252)  oxyCODONE (OXYCONTIN) 12 hr tablet 15 mg (not administered)  HYDROmorphone (DILAUDID) injection 1 mg (1 mg Intravenous Given 02/03/17 2232)  HYDROmorphone (DILAUDID) injection 1 mg (1 mg Intravenous Given 02/03/17 2256)  iopamidol (ISOVUE-300) 61 % injection (100 mLs  Contrast Given 02/03/17 2337)  ondansetron (ZOFRAN) injection 4 mg (4 mg Intravenous Given 02/04/17 0042)    Mobility walks

## 2017-02-04 NOTE — Progress Notes (Addendum)
Patient seen and evaluated, chart reviewed, please see EMR for updated orders. Please see full H&P dictated by admitting physician Dr. Renetta Chalk for same date of service.    Metastatic pancreatic cancer-increased pain, change OxyContin to 20 mg every 12 hours, fentanyl IV not effective, changed to IV Dilaudid 1 mg every 12 hours as needed.  Discussed with Dr. Melvia Heaps his oncologist  H/o DVT-given underlying malignancy patient is actually even more hypercoagulable at this time, he will continue Coumadin therapy  DM-continue insulin regimen  Social/Ethics-plan of care discussed with patient and daughter at bedside, patient is a DNR/DNI with comfort measures, patient plans to enroll in hospice in the near future, in the meantime continue to manage pain control along with his oncologist.  He declines further aggressive diagnostic or therapeutic modalities.  Patient seen and evaluated, chart reviewed, please see EMR for updated orders. Please see full H&P dictated by admitting physician Dr. Renetta Chalk for same date of service.

## 2017-02-04 NOTE — H&P (Signed)
History and Physical    Evan Cabrera OZD:[id number] DOB: 12-08-1960 DOA: 02/03/2017  PCP: [name], MD  Patient coming from: Home.  Chief Complaint: Abdominal pain.  HPI: Evan Cabrera is a 57 y.o. male with with history of recently diagnosed pancreatic mass most likely pancreatic carcinoma patient has opted for comfort measures only was placed on OxyContin yesterday and patient took 2 doses one in the morning yesterday and patient's pain worsened last night mostly in the upper quadrants.  Since patient had severe pain patient decided to come to the ER.  Denies any fever or chills.  He was able to eat.  ED Course: In the ER CT scan of the abdomen and pelvis done progression of the hepatic metastasis with known pancreatic mass.  Since patient has uncontrollable pain despite pain relief medications in the ER patient admitted for further observation.  Review of Systems: As per HPI, rest all negative.   Past Medical History:  Diagnosis Date  . Benign prostatic hypertrophy history  . Depression 2005  . DVT (deep venous thrombosis) ([place]) 2005  . 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 Factor 5 Leiden mutation, heterozygous ([place])   . GERD (gastroesophageal reflux disease)   . Hyperlipemia   . Pulmonary embolism ([place]) [id number]  . 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 Type 2 diabetes mellitus ([place])     Past Surgical History:  Procedure Laterality Date  . COLONOSCOPY  10/2011   Dr Glennon Hamilton  . COLONOSCOPY  12/2014   rpt 5 yrs Glennon Hamilton)  . KNEE SURGERY Right 2003   for cartilage  . VASECTOMY  1988  . WISDOM TOOTH EXTRACTION       reports that  has never smoked. he has never used smokeless tobacco. He reports that he does not drink alcohol or use drugs.  Allergies  Allergen Reactions  . Niacin     REACTION: rash, itching, hot  . Crestor [Rosuvastatin Calcium]     Memory loss  . Codeine     nausea    Family History  Problem Relation Age of Onset  . Pulmonary embolism  Maternal Grandfather   . Heart attack Father 7       CBAG X1  . Deep vein thrombosis Father   . Diabetes Father   . Uterine cancer Mother   . Hypertension Mother   . Hyperlipidemia Mother   . Heart disease Mother        3 vessel CBAG @ 79  . Diabetes Brother   . Heart attack Brother 39       Diabetic  . Stroke Neg Hx     Prior to Admission medications   Medication Sig Start Date End Date Taking? Authorizing Provider  esomeprazole (NEXIUM) 40 MG capsule Take 40 mg by mouth once.   Yes [provider]  folic acid (FOLVITE) 259 MCG tablet Take 800 mcg by mouth daily.     Yes [provider]  HYDROcodone-acetaminophen (NORCO/VICODIN) 5-325 MG tablet Take 1 tablet by mouth every 6 (six) hours as needed for moderate pain. Do not take with percocet 01/20/17  Yes Ladell Pier, MD  Insulin Detemir (LEVEMIR FLEXTOUCH) 100 UNIT/ML Pen Inject 25 Units into the skin at bedtime. 01/17/17  Yes [name], MD  insulin regular (NOVOLIN R RELION) 100 units/mL injection Inject 5 Units into the skin 3 (three) times daily before meals.  12/27/16  Yes [name], MD  Multiple Vitamin (MULTIVITAMIN) tablet Take  1 tablet by mouth daily.     Yes [provider]  Omega-3 Fatty Acids (FISH OIL) 1200 MG CAPS Take 1,200 mg by mouth daily.     Yes [provider]  oxyCODONE (OXYCONTIN) 10 mg 12 hr tablet Take 1 tablet (10 mg total) by mouth every 12 (twelve) hours. 01/07/17  Yes Owens Shark, NP  oxyCODONE-acetaminophen (PERCOCET/ROXICET) 5-325 MG tablet Take 1-2 tablets by mouth every 6 (six) hours as needed for severe pain. 01/26/17  Yes Ladell Pier, MD  promethazine (PHENERGAN) 25 MG tablet Take 0.5 tablets (12.5 mg total) by mouth every 8 (eight) hours as needed for nausea or vomiting. 01/26/17  Yes Ladell Pier, MD  simethicone (MYLICON) 782 MG chewable tablet Chew 125 mg by mouth every 6 (six) hours as needed for flatulence.   Yes [provider]  vitamin C (ASCORBIC ACID) 500 MG tablet Take 500 mg by mouth daily.     Yes [provider]  warfarin (JANTOVEN) 5 MG tablet TAKE AS DIRECTED BY        [facility] Patient taking differently: 7.5 MG on Sunday, Tuesday, Thursday, Saturday, 5 MG on Monday, Wednesday, Friday 01/07/17  Yes [name], MD  glucose blood (ACCU-CHEK AVIVA) test strip Use to check sugars TID and as needed for E11.65, insulin dependent 01/06/17   [name], MD  Insulin Pen Needle 31G X 5 MM MISC Use to inject insulin QID as directed 01/06/17   [name], MD  ondansetron (ZOFRAN) 4 MG tablet Take 1 tablet (4 mg total) by mouth every 6 (six) hours as needed for nausea or vomiting. 01/07/17   Owens Shark, NP    Physical Exam: Vitals:   02/03/17 2057 02/03/17 2104 02/03/17 2240 02/04/17 0045  BP: 139/77  121/79 117/70  Pulse: 88  (!) 101 63  Resp: 20  19 18   Temp: 98.1 F (36.7 C)  99.1 F (37.3 C)   TempSrc: Oral  Oral   SpO2: 99%  97% 95%  Weight:  65.8 kg (145 lb)        Constitutional: Moderately built and nourished. Vitals:   02/03/17 2057 02/03/17 2104 02/03/17 2240 02/04/17 0045  BP: 139/77  121/79 117/70  Pulse: 88  (!) 101 63  Resp: 20  19 18   Temp: 98.1 F (36.7 C)  99.1 F (37.3 C)   TempSrc: Oral  Oral   SpO2: 99%  97% 95%  Weight:  65.8 kg (145 lb)     Eyes: Anicteric no pallor. ENMT: No discharge from the ears eyes nose or mouth. Neck: No mass felt.  No neck rigidity. Respiratory: No rhonchi or crepitations. Cardiovascular: S1-S2 heard no murmurs appreciated. Abdomen: Soft nontender bowel sounds present. Musculoskeletal: No edema.  No joint effusion. Skin: No rash.  Skin appears warm. Neurologic: Alert awake oriented to time place and person.  Moves all extremities. Psychiatric: Appears normal.  Normal affect.   Labs on Admission: I have personally reviewed following labs and imaging studies  CBC: Recent Labs  Lab 02/03/17 2228  WBC 6.4    NEUTROABS 4.3  HGB 10.3*  HCT 30.6*  MCV 86.4  PLT 956*   Basic Metabolic Panel: Recent Labs  Lab 02/03/17 2228  NA 135  K 4.2  CL 98*  CO2 29  GLUCOSE 152*  BUN 15  CREATININE 0.79  CALCIUM 8.9   GFR: Estimated Creatinine Clearance: 93.7 mL/min (by C-G formula based on SCr of 0.79 mg/dL). Liver Function  Tests: Recent Labs  Lab 02/03/17 2228  AST 69*  ALT 23  ALKPHOS 140*  BILITOT 0.4  PROT 7.2  ALBUMIN 3.5   Recent Labs  Lab 02/03/17 2228  LIPASE 23   No results for input(s): AMMONIA in the last 168 hours. Coagulation Profile: No results for input(s): INR, PROTIME in the last 168 hours. Cardiac Enzymes: No results for input(s): CKTOTAL, CKMB, CKMBINDEX, TROPONINI in the last 168 hours. BNP (last 3 results) No results for input(s): PROBNP in the last 8760 hours. HbA1C: No results for input(s): HGBA1C in the last 72 hours. CBG: No results for input(s): GLUCAP in the last 168 hours. Lipid Profile: No results for input(s): CHOL, HDL, LDLCALC, TRIG, CHOLHDL, LDLDIRECT in the last 72 hours. Thyroid Function Tests: No results for input(s): TSH, T4TOTAL, FREET4, T3FREE, THYROIDAB in the last 72 hours. Anemia Panel: No results for input(s): VITAMINB12, FOLATE, FERRITIN, TIBC, IRON, RETICCTPCT in the last 72 hours. Urine analysis:    Component Value Date/Time   COLORURINE YELLOW 06/12/2015 [place] 06/12/2015 0243   LABSPEC 1.034 (H) 06/12/2015 0243   PHURINE 5.5 06/12/2015 0243   GLUCOSEU >1000 (A) 06/12/2015 0243   HGBUR NEGATIVE 06/12/2015 0243   BILIRUBINUR NEGATIVE 06/12/2015 0243   KETONESUR >80 (A) 06/12/2015 0243   PROTEINUR NEGATIVE 06/12/2015 0243   NITRITE NEGATIVE 06/12/2015 0243   LEUKOCYTESUR NEGATIVE 06/12/2015 0243   Sepsis Labs: @LABRCNTIP (procalcitonin:4,lacticidven:4) )No results found for this or any previous visit (from the past 240 hour(s)).   Radiological Exams on Admission: Ct Abdomen Pelvis W  Contrast  Result Date: 02/04/2017 CLINICAL DATA:  New onset right upper abdominal pain. History of pancreatic cancer. EXAM: CT ABDOMEN AND PELVIS WITH CONTRAST TECHNIQUE: Multidetector CT imaging of the abdomen and pelvis was performed using the standard protocol following bolus administration of intravenous contrast. CONTRAST:  160mL ISOVUE-300 IOPAMIDOL (ISOVUE-300) INJECTION 61% COMPARISON:  CT abdomen dated 12/24/2016. FINDINGS: Lower chest: No significant findings.  Mild bibasilar atelectasis. Hepatobiliary: Significant interval progression of the hepatic metastases. All lesions have enlarged in the interval. Largest lesion now measures 9 cm, previously approximately 6 cm. Gallbladder is mildly distended but otherwise unremarkable. No bile duct dilatation. Pancreas: The area of masslike low-density within the pancreatic head/neck region is grossly stable, measuring approximately 6 cm extent. The adjacent cystic mass anterior to the pancreas is slightly larger, measuring 3.4 cm greatest dimension on today's study (previously 3 cm). The previously measured peripancreatic lymph node, just above the level of the pancreas, is not significantly changed in the interval. Spleen: Normal in size without focal abnormality. Adrenals/Urinary Tract: Adrenal glands appear normal. Kidneys are unremarkable without mass, stone or hydronephrosis. Bladder appears normal. Stomach/Bowel: Bowel is normal in caliber. No bowel wall thickening or evidence of bowel wall inflammation seen. Appendix appears normal. Vascular/Lymphatic: Aortic atherosclerosis. No acute appearing vascular abnormality. Persistent occlusion of the splenic vein. Conglomerate lymphadenopathy within the periaortic retroperitoneum, most prominent below the level of the renal veins, appears grossly stable. Overall peripancreatic lymphadenopathy is similar in extent. Reproductive: Prostate is unremarkable. Other: Trace free fluid in the lower pelvis. No abscess  collection seen. No free intraperitoneal air. Musculoskeletal: No acute or suspicious osseous finding. IMPRESSION: 1. Significant interval progression of the hepatic metastases. All hepatic masses have enlarged compared to the CT of 12/24/2016, largest lesion now measuring 9 cm greatest dimension. 2. Poorly defined pancreatic mass, presumed pancreatic carcinoma, appears grossly stable. 3. Conglomerate metastatic lymphadenopathy within the retroperitoneum appears stable. Overall, the peripancreatic lymphadenopathy  also appears stable. 4. The cystic mass anterior to the pancreas, presumably associated with the underlying pancreatic cancer, has slightly enlarged with a current measurement of 3.4 cm (previously 3 cm). 5. 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 Electronically Signed   By: Franki Cabot M.D.   On: 02/04/2017 00:04      Assessment/Plan Principal Problem:   Abdominal pain Active Problems:   HLD (hyperlipidemia)   Factor 5 Leiden mutation, heterozygous (HCC)   Sleep apnea   Diabetes mellitus type 2, uncontrolled, with complications (HCC)   Pain    1. Intractable abdominal pain is probably secondary to progression of patient's known metastatic disease -I have placed patient on PRN IV fentanyl and increased patient's OxyContin dose from 10 mg 3 times daily to 50 mg twice daily.  May discuss with patient's oncologist Dr. Learta Codding in a.m. 2. Diabetes mellitus type 2 -on Levemir the dose of which I have decreased from 25-20.  Patient takes at bedtime.  Also on sliding scale coverage.  Closely follow CBGs. 3. 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 4. Anemia normocytic normochromic likely from malignancy -follow CBC.   DVT prophylaxis: Coumadin. Code Status: DNR. Family  Communication: Discussed with patient. Disposition Plan: Home. Consults called: None. Admission status: Observation.   [name] MD [facility] Pager [phone].  If 7PM-7AM, please contact night-coverage [email or web address] Password [email or web address]  02/04/2017, 1:08 AM

## 2017-02-04 NOTE — ED Notes (Signed)
Pt would like labs drawn from his iv RN [facility] made aware.

## 2017-02-05 DIAGNOSIS — R1011 Right upper quadrant pain: Secondary | ICD-10-CM | POA: Diagnosis not present

## 2017-02-05 DIAGNOSIS — D6851 Activated protein C resistance: Secondary | ICD-10-CM | POA: Diagnosis not present

## 2017-02-05 DIAGNOSIS — C258 Malignant neoplasm of overlapping sites of pancreas: Secondary | ICD-10-CM | POA: Diagnosis not present

## 2017-02-05 LAB — GLUCOSE, CAPILLARY
GLUCOSE-CAPILLARY: 217 mg/dL — AB (ref 65–99)
Glucose-Capillary: 161 mg/dL — ABNORMAL HIGH (ref 65–99)

## 2017-02-05 LAB — PROTIME-INR
INR: 2.68
Prothrombin Time: 28.3 seconds — ABNORMAL HIGH (ref 11.4–15.2)

## 2017-02-05 MED ORDER — POLYETHYLENE GLYCOL 3350 17 G PO PACK: 17.0000 g | PACK | Freq: Every day | ORAL | 3 refills | Status: AC

## 2017-02-05 MED ORDER — SENNOSIDES-DOCUSATE SODIUM 8.6-50 MG PO TABS: 2.0000 | ORAL_TABLET | Freq: Two times a day (BID) | ORAL | 2 refills | Status: AC

## 2017-02-05 MED ORDER — OXYCODONE HCL ER 20 MG PO T12A: 20.0000 mg | EXTENDED_RELEASE_TABLET | Freq: Two times a day (BID) | ORAL | 0 refills | Status: AC

## 2017-02-05 MED ORDER — OXYCODONE HCL 5 MG PO TABS: ORAL_TABLET | ORAL | 0 refills | Status: AC

## 2017-02-05 MED ORDER — PROMETHAZINE HCL 25 MG PO TABS
25.0000 mg | ORAL_TABLET | Freq: Three times a day (TID) | ORAL | 1 refills | Status: AC | PRN
Start: 2017-02-05 — End: ?

## 2017-02-05 NOTE — Discharge Summary (Signed)
Evan Cabrera, is a 57 y.o. male  DOB 05-24-1960  MRN [id number].  Admission date:  02/03/2017  Admitting Physician  [name], MD  Discharge Date:  02/05/2017   Primary MD  [name], MD  Recommendations for primary care physician for things to follow:   1)Take medications as prescribed, including pain medications, diabetic medications and antinausea medications 2)Pain medications can contribute to constipation and nausea, drink plenty of fluids 3)Talk to your oncologist Dr. Learta Codding about your Coumadin therapy and also talk to Him about refills for your pain medication 4)Consider enrollment in hospice if you need additional help with your pain management (symptom management)   Admission Diagnosis  Abdominal pain [R10.9]   Discharge Diagnosis  Abdominal pain [R10.9]    Principal Problem:   Abdominal pain Active Problems:   HLD (hyperlipidemia)   Factor 5 Leiden mutation, heterozygous ([place])   Sleep apnea   Diabetes mellitus type 2, uncontrolled, with complications ([place])   Pain   Malignant neoplasm of pancreas [facility])      Past Medical History:  Diagnosis Date  . Benign prostatic hypertrophy history  . Depression 2005  . DVT (deep venous thrombosis) ([place]) 2005  . Elevated homocysteine ([place])   . Factor 5 Leiden mutation, heterozygous ([place])   . GERD (gastroesophageal reflux disease)   . Hyperlipemia   . Pulmonary embolism ([place]) [id number]  . 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 Type 2 diabetes mellitus ([place])     Past Surgical History:  Procedure Laterality Date  . COLONOSCOPY  10/2011   Dr Glennon Hamilton  . COLONOSCOPY  12/2014   rpt 5 yrs Glennon Hamilton)  . KNEE SURGERY Right 2003   for cartilage  . VASECTOMY  1988  . WISDOM TOOTH EXTRACTION         HPI  from the history and physical done on the day of admission:   Chief Complaint: Abdominal pain.  HPI: Evan Cabrera  is a 57 y.o. male with with history of recently diagnosed pancreatic mass most likely pancreatic carcinoma patient has opted for comfort measures only was placed on OxyContin yesterday and patient took 2 doses one in the morning yesterday and patient's pain worsened last night mostly in the upper quadrants.  Since patient had severe pain patient decided to come to the ER.  Denies any fever or chills.  He was able to eat.  ED Course: In the ER CT scan of the abdomen and pelvis done progression of the hepatic metastasis with known pancreatic mass.  Since patient has uncontrollable pain despite pain relief medications in the ER patient admitted for further observation.   Hospital Course:     Assessment/Plan:  1)Metastatic pancreatic cancer-increased pain, change OxyContin to 20 mg every 12 hours, may use OxyIR 5-10 mg every 4 hours as needed for breakthrough pain  Discussed with Dr. Melvia Heaps his oncologist, Pt has Pancreatic Mass of neck/body,  numerous liver lesions, peripancreatic and retroperitoneal adenopathy, now with progressive liver metastases and increased  abdominal pain.  Patient previously declined biopsy for more definitive diagnosis however presumed diagnosis of metastatic pancreatic cancer was made, patient had declined chemo or other type of palliative therapy.  He will enrol in hospice when he feels the need to do so as outpatient  2)H/o DVT/PE/Factor V Leyden Deficiency/Elevated Homocysteine--- given underlying malignancy patient is actually even more hypercoagulable at this time, however given liver metastases and erratic oral intake you to be difficult to regulate his INR while on Coumadin, patient at this time will like to continue Coumadin therapy  3)DM-continue insulin regimen  4)Social/Ethics-plan of care discussed with patient, wife and daughter at bedside, patient is a DNR/DNI with comfort measures, patient plans to enroll in hospice in the near future, in the meantime  continue to manage pain control along with his oncologist.  He declines further aggressive diagnostic or therapeutic modalities.  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 Learta Codding   Consults obtained - Oncology  Diet and Activity recommendation:  As advised  Discharge Instructions    Discharge Instructions    Call MD for:   Complete by:  As directed    Call MD for:  difficulty breathing, headache or visual disturbances   Complete by:  As directed    Call MD for:  persistant dizziness or light-headedness   Complete by:  As directed    Call MD for:  persistant nausea and vomiting   Complete by:  As directed    Call MD for:  severe uncontrolled pain   Complete by:  As directed    Call MD for:  temperature >100.4   Complete by:  As directed    Diet - low sodium heart healthy   Complete by:  As directed    Discharge instructions   Complete by:  As directed    1)Take medications as prescribed, including pain medications, diabetic medications and antinausea medications 2)Pain medications can contribute to constipation and nausea, drink plenty of fluids 3)Talk to your oncologist Dr. Learta Codding about your Coumadin therapy and also talk to Him about refills for your pain medication 4)Consider enrollment in hospice if you need additional help with your pain management (symptom management)   Increase activity slowly   Complete by:  As directed        Discharge Medications     Allergies as of 02/05/2017      Reactions   Niacin    REACTION: rash, itching, hot   Crestor [rosuvastatin Calcium]    Memory loss   Codeine    nausea      Medication List    STOP taking these medications   HYDROcodone-acetaminophen 5-325 MG  tablet Commonly known as:  NORCO/VICODIN   oxyCODONE-acetaminophen 5-325 MG tablet Commonly known as:  PERCOCET/ROXICET     TAKE these medications   esomeprazole 40 MG capsule Commonly known as:  NEXIUM Take 40 mg by mouth once.   Fish Oil 1200 MG Caps Take 1,200 mg by mouth daily.   folic acid 601 MCG tablet Commonly known as:  FOLVITE Take 800 mcg by mouth daily.   glucose blood test strip Commonly known as:  ACCU-CHEK AVIVA Use to check sugars TID and as needed for E11.65, insulin dependent   Insulin Detemir 100 UNIT/ML Pen Commonly known as:  LEVEMIR FLEXTOUCH Inject 25  Units into the skin at bedtime.   Insulin Pen Needle 31G X 5 MM Misc Use to inject insulin QID as directed   multivitamin tablet Take 1 tablet by mouth daily.   NOVOLIN R RELION 100 units/mL injection Generic drug:  insulin regular Inject 5 Units into the skin 3 (three) times daily before meals.   ondansetron 4 MG tablet Commonly known as:  ZOFRAN Take 1 tablet (4 mg total) by mouth every 6 (six) hours as needed for nausea or vomiting.   oxyCODONE 20 mg 12 hr tablet Commonly known as:  OXYCONTIN Take 1 tablet (20 mg total) by mouth every 12 (twelve) hours. Cancer pain What changed:    medication strength  how much to take  additional instructions   oxyCODONE 5 MG immediate release tablet Commonly known as:  Oxy IR/ROXICODONE Take 1 to 2 Tab every 4 hrs as needed for Breakthrough Cancer pain What changed:  You were already taking a medication with the same name, and this prescription was added. Make sure you understand how and when to take each.   polyethylene glycol packet Commonly known as:  MIRALAX / GLYCOLAX Take 17 g by mouth daily.   promethazine 25 MG tablet Commonly known as:  PHENERGAN Take 1 tablet (25 mg total) by mouth every 8 (eight) hours as needed for nausea or vomiting. What changed:  how much to take   senna-docusate 8.6-50 MG tablet Commonly known as:   Senokot-S Take 2 tablets by mouth 2 (two) times daily.   simethicone 125 MG chewable tablet Commonly known as:  MYLICON Chew 557 mg by mouth every 6 (six) hours as needed for flatulence.   vitamin C 500 MG tablet Commonly known as:  ASCORBIC ACID Take 500 mg by mouth daily.   warfarin 5 MG tablet Commonly known as:  JANTOVEN Take as directed. If you are unsure how to take this medication, talk to your nurse or doctor. Original instructions:  TAKE AS DIRECTED BY        C[facility]MADIN CLINIC What changed:  additional instructions       Major procedures and Radiology Reports - PLEASE review detailed and final reports for all details, in brief -   Ct Abdomen Pelvis W Contrast  Result Date: 02/04/2017 CLINICAL DATA:  New onset right upper abdominal pain. History of pancreatic cancer. EXAM: CT ABDOMEN AND PELVIS WITH CONTRAST TECHNIQUE: Multidetector CT imaging of the abdomen and pelvis was performed using the standard protocol following bolus administration of intravenous contrast. CONTRAST:  152mL ISOVUE-300 IOPAMIDOL (ISOVUE-300) INJECTION 61% COMPARISON:  CT abdomen dated 12/24/2016. FINDINGS: Lower chest: No significant findings.  Mild bibasilar atelectasis. Hepatobiliary: Significant interval progression of the hepatic metastases. All lesions have enlarged in the interval. Largest lesion now measures 9 cm, previously approximately 6 cm. Gallbladder is mildly distended but otherwise unremarkable. No bile duct dilatation. Pancreas: The area of masslike low-density within the pancreatic head/neck region is grossly stable, measuring approximately 6 cm extent. The adjacent cystic mass anterior to the pancreas is slightly larger, measuring 3.4 cm greatest dimension on today's study (previously 3 cm). The previously measured peripancreatic lymph node, just above the level of the pancreas, is not significantly changed in the interval. Spleen: Normal in size without focal abnormality. Adrenals/Urinary Tract:  Adrenal glands appear normal. Kidneys are unremarkable without mass, stone or hydronephrosis. Bladder appears normal. Stomach/Bowel: Bowel is normal in caliber. No bowel wall thickening or evidence of bowel wall inflammation seen. Appendix appears normal. Vascular/Lymphatic: Aortic atherosclerosis. No  acute appearing vascular abnormality. Persistent occlusion of the splenic vein. Conglomerate lymphadenopathy within the periaortic retroperitoneum, most prominent below the level of the renal veins, appears grossly stable. Overall peripancreatic lymphadenopathy is similar in extent. Reproductive: Prostate is unremarkable. Other: Trace free fluid in the lower pelvis. No abscess collection seen. No free intraperitoneal air. Musculoskeletal: No acute or suspicious osseous finding. IMPRESSION: 1. Significant interval progression of the hepatic metastases. All hepatic masses have enlarged compared to the CT of 12/24/2016, largest lesion now measuring 9 cm greatest dimension. 2. Poorly defined pancreatic mass, presumed pancreatic carcinoma, appears grossly stable. 3. Conglomerate metastatic lymphadenopathy within the retroperitoneum appears stable. Overall, the peripancreatic lymphadenopathy also appears stable. 4. The cystic mass anterior to the pancreas, presumably associated with the underlying pancreatic cancer, has slightly enlarged with a current measurement of 3.4 cm (previously 3 cm). 5. 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 Electronically Signed   By: Franki Cabot M.D.   On: 02/04/2017 00:04    Micro Results   No results found for this or any previous visit (from the past 240 hour(s)).  Today   Subjective    Evan Cabrera today has no new complaints, abdominal pain and nausea is better with current regimen, no fever no chills, wife at bedside, questions answered          Patient  has been seen and examined prior to discharge   Objective   Blood pressure 112/70, pulse 74, temperature 98.9 F (37.2 C), temperature source Oral, resp. rate 16, height 5\' 6"  (1.676 m), weight 65.8 kg (145 lb), SpO2 99 %.   Intake/Output Summary (Last 24 hours) at 02/05/2017 1152 Last data filed at 02/05/2017 0443 Gross per 24 hour  Intake 850 ml  Output -  Net 850 ml    Exam Gen:- Awake  In no apparent distress  HEENT:- [place].AT,   Neck-Supple Neck,No JVD,  Lungs- mostly clear  CV- S1, S2 normal Abd-  +ve B.Sounds, Abd Soft, much improved tenderness,    Extremity/Skin:- Intact peripheral pulses   Psych-affect is appropriate Neuro-no tremors no new focal deficits   Data Review   CBC w Diff:  Lab Results  Component Value Date   WBC 6.1 02/04/2017   HGB 9.7 (L) 02/04/2017   HCT 28.9 (L) 02/04/2017   PLT 123 (L) 02/04/2017   LYMPHOPCT 19 02/03/2017   MONOPCT 12 02/03/2017   EOSPCT 1 02/03/2017   BASOPCT 0 02/03/2017    CMP:  Lab Results  Component Value Date   NA 136 02/04/2017   K 4.4 02/04/2017   CL 99 (L) 02/04/2017   CO2 30 02/04/2017   BUN 15 02/04/2017   CREATININE 0.85 02/04/2017   PROT 7.2 02/03/2017   ALBUMIN 3.5 02/03/2017   BILITOT 0.4 02/03/2017   ALKPHOS 140 (H) 02/03/2017   AST 69 (H) 02/03/2017   ALT 23 02/03/2017  .   Total Discharge time is about 33 minutes  Roxan Hockey M.D on 02/05/2017 at 11:52 AM  [facility]   Office  [phone]  Voice Recognition Viviann Spare dictation system was used to create this note, attempts have been made to correct errors. Please contact the author with questions and/or clarifications.

## 2017-02-05 NOTE — Progress Notes (Signed)
IP PROGRESS NOTE  Subjective:   Evan Cabrera is up eating breakfast.  He reports improved pain and nausea today.  He wants to go home. Objective: Vital signs in last 24 hours: Blood pressure 112/70, pulse 74, temperature 98.9 F (37.2 C), temperature source Oral, resp. rate 16, height 5\' 6"  (1.676 m), weight 145 lb (65.8 kg), SpO2 99 %.  Intake/Output from previous day: 02/01 0701 - 02/02 0700 In: 1090 [P.O.:1090] Out: -   Physical Exam:   Abdomen: No mass, less tender in the upper abdomen Extremities: No leg edema    Lab Results: Recent Labs    02/03/17 2228 02/04/17 0500  WBC 6.4 6.1  HGB 10.3* 9.7*  HCT 30.6* 28.9*  PLT 130* 123*    BMET Recent Labs    02/03/17 2228 02/04/17 0500  NA 135 136  K 4.2 4.4  CL 98* 99*  CO2 29 30  GLUCOSE 152* 92  BUN 15 15  CREATININE 0.79 0.85  CALCIUM 8.9 8.8*   PT/INR 2.68 Studies/Results: Ct Abdomen Pelvis W Contrast  Result Date: 02/04/2017 CLINICAL DATA:  New onset right upper abdominal pain. History of pancreatic cancer. EXAM: CT ABDOMEN AND PELVIS WITH CONTRAST TECHNIQUE: Multidetector CT imaging of the abdomen and pelvis was performed using the standard protocol following bolus administration of intravenous contrast. CONTRAST:  175mL ISOVUE-300 IOPAMIDOL (ISOVUE-300) INJECTION 61% COMPARISON:  CT abdomen dated 12/24/2016. FINDINGS: Lower chest: No significant findings.  Mild bibasilar atelectasis. Hepatobiliary: Significant interval progression of the hepatic metastases. All lesions have enlarged in the interval. Largest lesion now measures 9 cm, previously approximately 6 cm. Gallbladder is mildly distended but otherwise unremarkable. No bile duct dilatation. Pancreas: The area of masslike low-density within the pancreatic head/neck region is grossly stable, measuring approximately 6 cm extent. The adjacent cystic mass anterior to the pancreas is slightly larger, measuring 3.4 cm greatest dimension on today's study  (previously 3 cm). The previously measured peripancreatic lymph node, just above the level of the pancreas, is not significantly changed in the interval. Spleen: Normal in size without focal abnormality. Adrenals/Urinary Tract: Adrenal glands appear normal. Kidneys are unremarkable without mass, stone or hydronephrosis. Bladder appears normal. Stomach/Bowel: Bowel is normal in caliber. No bowel wall thickening or evidence of bowel wall inflammation seen. Appendix appears normal. Vascular/Lymphatic: Aortic atherosclerosis. No acute appearing vascular abnormality. Persistent occlusion of the splenic vein. Conglomerate lymphadenopathy within the periaortic retroperitoneum, most prominent below the level of the renal veins, appears grossly stable. Overall peripancreatic lymphadenopathy is similar in extent. Reproductive: Prostate is unremarkable. Other: Trace free fluid in the lower pelvis. No abscess collection seen. No free intraperitoneal air. Musculoskeletal: No acute or suspicious osseous finding. IMPRESSION: 1. Significant interval progression of the hepatic metastases. All hepatic masses have enlarged compared to the CT of 12/24/2016, largest lesion now measuring 9 cm greatest dimension. 2. Poorly defined pancreatic mass, presumed pancreatic carcinoma, appears grossly stable. 3. Conglomerate metastatic lymphadenopathy within the retroperitoneum appears stable. Overall, the peripancreatic lymphadenopathy also appears stable. 4. The cystic mass anterior to the pancreas, presumably associated with the underlying pancreatic cancer, has slightly enlarged with a current measurement of 3.4 cm (previously 3 cm). 5. 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 Electronically Signed   By: Franki Cabot M.D.   On: 02/04/2017 00:04    Medications: I have reviewed the patient's current  medications.  Assessment/Plan:  1. Pancreas  neck/body mass, numerous liver lesions, peripancreatic and retroperitoneal adenopathy on CT 12/24/2016.  CT 02/04/2017-stable pancreas mass/adenopathy, progressive liver metastases 2. Abdominal/back pain secondary to #-progressive, likely secondary to progressive liver metastases. 3. Weight loss secondary to #1. 4. Diabetes mellitus. 5. Factor V Leiden gene mutation. 6. History of elevated homocystine. 7. 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  Evan Cabrera appears stable this morning.  He reports improvement in pain.  I recommended he continue OxyContin twice daily.  He will use Percocet for breakthrough pain at home.  He will [facility] in the [facility] hosp[facility].  We discussed the risk/benefit of continuing anticoagulation therapy.  He prefers continuing Coumadin for now.  The Coumadin is managed through the [facility] clinic.  It may be difficult to regulate Coumadin anticoagulation as his clinical condition declines.  Outpatient follow-up will be scheduled at the Cancer center for the week of 02/14/2017.  Please call oncology as needed.     LOS: 0 days   Betsy Coder, MD   02/05/2017, 6:22 AM

## 2017-02-05 NOTE — Progress Notes (Addendum)
Discharge instructions reviewed with patient utilizing teach back method no questions at this time patient discharged to home. °

## 2017-02-05 NOTE — Discharge Instructions (Signed)
1)Take medications as prescribed, including pain medications, diabetic medications and antinausea medications 2)Pain medications can contribute to constipation and nausea, drink plenty of fluids 3)Talk to your oncologist Dr. Learta Codding about your Coumadin therapy and also talk to Him about refills for your pain medication 4)Consider enrollment in hospice if you need additional help with your pain management (symptom management) 5)The [employer] Cliche is [employer].

## 2017-02-05 NOTE — Care Management Note (Signed)
Case Management Note  Patient Details  Name: Evan Cabrera MRN: [id number] Date of Birth: September 02, 1960  Subjective/Objective:   Metastatic Pancreatic Cancer                 Action/Plan: Spoke to pt and offered choice for Hospice. Pt states they have contact information for [facility]. He and his wife want to discuss Home Hospice. States he will call and establish with Hospice if he decides he wants Home Hospice. They do have [place] brochure and have talked with representative in the past.   Expected Discharge Date:  02/05/17               Expected Discharge Plan:  Home/Self Care  In-House Referral:  NA  Discharge planning Services  CM Consult  Post Acute Care Choice:  Hospice Choice offered to:  Patient, Spouse  DME Arranged:  N/A DME Agency:  NA  HH Arranged:  NA HH Agency:  NA  Status of Service:  Completed, signed off  If discussed at [place] of Stay Meetings, dates discussed:    Additional Comments:  [name], RN 02/05/2017, 12:53 PM

## 2017-02-06 ENCOUNTER — Other Ambulatory Visit: Payer: Self-pay | Admitting: Family Medicine

## 2017-02-07 ENCOUNTER — Ambulatory Visit: Payer: 59 | Admitting: Oncology

## 2017-02-07 ENCOUNTER — Telehealth: Payer: Self-pay | Admitting: Nurse Practitioner

## 2017-02-07 NOTE — Telephone Encounter (Signed)
R/s appt per 2/4 sch message.

## 2017-02-08 ENCOUNTER — Ambulatory Visit: Payer: Self-pay

## 2017-02-08 DIAGNOSIS — Z7901 Long term (current) use of anticoagulants: Secondary | ICD-10-CM

## 2017-02-08 DIAGNOSIS — D6851 Activated protein C resistance: Secondary | ICD-10-CM

## 2017-02-08 LAB — POCT INR: INR: 3.4

## 2017-02-08 NOTE — Telephone Encounter (Signed)
Patient currently undergoing comfort care measures for suspected pancreatic/liver cancer with possible metz.  There is no current follow scheduled with Dr. Darnell Level as he is being followed carefully by oncology.  I expect refills are appropriate but will pend for Dr. Darnell Level approval in case a f/u appointment may be needed to monitor effectiveness of insulin.  Patient also has seen and endocrinologist in recent past.

## 2017-02-08 NOTE — Patient Instructions (Signed)
Received fax report from [facility] and verbal report from patient.  INR 3.4 02/08/17  Patient reports missing 2 doses last week which contributed to subtherapuetic reading on 2/1. Hospital gave him 10mg  on 2/2 and then he went back on prior dosing on 2/3.  Patient instructed to continue on the 7.5mg  daily EXCEPT for 5mg  on Monday, Wednesday and Friday.  We will continue weekly checks to monitor carefully as a result of patients current health status of suspected pancreatic/liver cancer with frequent medication changes.  Note: patient has set up with Hospice for assistance with symptom management.    Patient verbalizes understanding of all instructions and will call me next week with report.

## 2017-02-09 NOTE — Telephone Encounter (Signed)
Filled

## 2017-02-14 ENCOUNTER — Ambulatory Visit: Payer: 59 | Admitting: Nurse Practitioner

## 2017-02-15 ENCOUNTER — Telehealth: Payer: Self-pay | Admitting: Family Medicine

## 2017-02-15 ENCOUNTER — Ambulatory Visit: Payer: Self-pay

## 2017-02-15 ENCOUNTER — Ambulatory Visit: Payer: 59 | Admitting: Nurse Practitioner

## 2017-02-15 DIAGNOSIS — D6851 Activated protein C resistance: Secondary | ICD-10-CM

## 2017-02-15 DIAGNOSIS — Z7901 Long term (current) use of anticoagulants: Secondary | ICD-10-CM

## 2017-02-15 LAB — POCT INR: INR: 6.1

## 2017-02-15 NOTE — Telephone Encounter (Signed)
Copied from [place] [id number]. Topic: Quick Communication - See Telephone Encounter >> Feb 15, 2017  1:01 PM Valla Leaver wrote: CRM for notification. See Telephone encounter for: [facility] calling to report Critical INR 6.1 today for the patient. Please follow up with patient.   02/15/17.

## 2017-02-15 NOTE — Telephone Encounter (Signed)
Patient notified of results and hold instructions given. Please refer to coag encounter note for today.

## 2017-02-15 NOTE — Patient Instructions (Signed)
Received fax report from [facility] and verbal report from patient.  INR 6.1 02/15/17 Patient is now on oxycontin and oxycodone for breakthrough pain.  The oxycodone can have potential increasing effects on warfain/INR.    Patient is to hold coumadin today (2/12) tomorrow (2/13) and Thursday (2/14) and retest first thing Friday (2/15) am and call me with results.  He denies any unusual bruising and bleeding and will got to ER if any concerns develop.  Patient is aware of risks associated with his current supratherapeutic level.  *I did educate patient on uncertainty with fingerstick device with INR greater than 6 and recommendation to recheck for confirmation via venipuncture.  I did notify PCP as well who (given patient's current cancer dx) did okay to give patient option whether to come in.  Patient chose not to come in for venipuncture and just hold medication until recheck friday.    Patient verbalizes understanding of all instructions and will call me next week with report.

## 2017-02-18 ENCOUNTER — Ambulatory Visit: Payer: Self-pay

## 2017-02-18 DIAGNOSIS — D6851 Activated protein C resistance: Secondary | ICD-10-CM

## 2017-02-18 DIAGNOSIS — Z7901 Long term (current) use of anticoagulants: Secondary | ICD-10-CM

## 2017-02-18 LAB — POCT INR: INR: 1.8

## 2017-02-24 ENCOUNTER — Ambulatory Visit (INDEPENDENT_AMBULATORY_CARE_PROVIDER_SITE_OTHER): Payer: 59 | Admitting: General Practice

## 2017-02-24 DIAGNOSIS — D6851 Activated protein C resistance: Secondary | ICD-10-CM

## 2017-02-24 DIAGNOSIS — Z7901 Long term (current) use of anticoagulants: Secondary | ICD-10-CM | POA: Diagnosis not present

## 2017-02-24 LAB — POCT INR: INR: 3.3

## 2017-02-24 NOTE — Patient Instructions (Addendum)
Pre visit review using our clinic review tool, if applicable. No additional management support is needed unless otherwise documented below in the visit note.  Received fax report from [employer] and verbal report from patient.  INR 3.3 on 02/24/17.  Continue dosing of 5mg  daily EXCEPT for 7.5mg  on Saturdays, recheck in 1 week.    Patient verbalizes understanding of all instructions and will call me next week with report.

## 2017-03-03 ENCOUNTER — Telehealth: Payer: Self-pay | Admitting: Family Medicine

## 2017-03-03 ENCOUNTER — Ambulatory Visit (INDEPENDENT_AMBULATORY_CARE_PROVIDER_SITE_OTHER): Payer: 59 | Admitting: General Practice

## 2017-03-03 DIAGNOSIS — Z7901 Long term (current) use of anticoagulants: Secondary | ICD-10-CM | POA: Diagnosis not present

## 2017-03-03 DIAGNOSIS — D6851 Activated protein C resistance: Secondary | ICD-10-CM

## 2017-03-03 LAB — POCT INR: INR: 7.7

## 2017-03-03 NOTE — Patient Instructions (Signed)
Pre visit review using our clinic review tool, if applicable. No additional management support is needed unless otherwise documented below in the visit note.  Hold coumadin through Sunday and re-check INR on Monday, 3/4.  Go to ER if any unusual bleeding occurs.

## 2017-03-03 NOTE — Telephone Encounter (Signed)
Call for critical lab result:  INR: 7.7   Patient can be reached at [phone]

## 2017-03-03 NOTE — Telephone Encounter (Signed)
Thank you. This has been addressed.

## 2017-03-07 ENCOUNTER — Ambulatory Visit: Payer: Self-pay

## 2017-03-07 DIAGNOSIS — Z7901 Long term (current) use of anticoagulants: Secondary | ICD-10-CM

## 2017-03-07 DIAGNOSIS — D6851 Activated protein C resistance: Secondary | ICD-10-CM

## 2017-03-07 LAB — POCT INR
INR: 2
INR: 2

## 2017-03-07 NOTE — Patient Instructions (Signed)
INR today 2.0 after 4 day hold.  Patient is to restart on decreased dosing of 5mg  (1pill) daily EXCEPT for 1/2 pill (2.5mg ) on Mondays and Fridays.  Recheck in 1 week. Patient verbalizes understanding of instructions given today.

## 2017-03-11 ENCOUNTER — Ambulatory Visit: Payer: Self-pay | Admitting: *Deleted

## 2017-03-11 ENCOUNTER — Ambulatory Visit: Payer: Self-pay

## 2017-03-11 DIAGNOSIS — Z7901 Long term (current) use of anticoagulants: Secondary | ICD-10-CM

## 2017-03-11 DIAGNOSIS — D6851 Activated protein C resistance: Secondary | ICD-10-CM

## 2017-03-11 LAB — POCT INR: INR: 3

## 2017-03-11 NOTE — Telephone Encounter (Signed)
Noted. Thank you. agree

## 2017-03-11 NOTE — Telephone Encounter (Signed)
I have been in contact with patient all day today.  He checks his INR at home with home monitoring device and result today was 3.0.  Please refer to my coag encounter note but instructions to stay on the same dose and recheck in 1 week per our usual routine were given.  Patient states he is doing very well today and has had no further bleeding episodes.  He is aware to go to ER if any concerning bleeding develops and will also make me aware via phone if any further concerns arise.

## 2017-03-11 NOTE — Telephone Encounter (Signed)
Called in c/o having a nosebleed this morning.   He takes Coumadin.   He thought his nose was running and reached up to wipe it and noticed it was blood.   He said it was not a frank bloody nose but since he is on the Coumadin he wanted to let Estill Bamberg the person at the office at [facility] that monitors his INR aware.   No longer bleeding now.   Denies any dizziness, light headedness.     I routed a note to Magdalen Spatz in the [facility] at the office making her aware of the situation.  I instructed the pt to call us back if he begins bleeding again and can't get it stopped or go to the ED if he starts bleeding heavily.    Reason for Disposition . [1] Mild-moderate nosebleed AND [2] bleeding stopped now  Answer Assessment - Initial Assessment Questions 1. AMOUNT OF BLEEDING: "How bad is the bleeding?" "How much blood was lost?" "Has the bleeding stopped?"   - MILD: needed a couple tissues   - MODERATE: needed many tissues   - SEVERE: large blood clots, soaked many tissues, lasted more than 30 minutes      Not bleeding now.   It stopped after 1-2 minutes.   I only get blood if I blow my nose.   I'm doing it gently. 2. ONSET: "When did the nosebleed start?"      This morning 6:00am 3. FREQUENCY: "How many nosebleeds have you had in the last 24 hours?"      No 4. RECURRENT SYMPTOMS: "Have there been other recent nosebleeds?" If so, ask: "How long did it take you to stop the bleeding?" "What worked best?"      No   I'm on Coumadin 5. CAUSE: "What do you think caused this nosebleed?"     The Coumadin. 6. LOCAL FACTORS: "Do you have any cold symptoms?", "Have you been rubbing or picking at your nose?"     Not been sick.  I have pancreatic cancer. 7. 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  8. BLOOD THINNERS: "Do you take any blood thinners?" (e.g., coumadin, heparin, aspirin, Plavix)     Coumadin 9. OTHER  SYMPTOMS: "Do you have any other symptoms?" (e.g., lightheadedness)     No 10. PREGNANCY: "Is there any chance you are pregnant?" "When was your last menstrual period?"       Not asked  Protocols used: NOSEBLEED-A-AH

## 2017-03-11 NOTE — Patient Instructions (Signed)
INR today 3.0  Patient is to continue taking 5mg  (1pill) daily EXCEPT for 1/2 pill (2.5mg ) on Mondays and Fridays.  Recheck in 1 week. Patient verbalizes understanding of instructions given today.

## 2017-03-22 ENCOUNTER — Telehealth: Payer: Self-pay | Admitting: Family Medicine

## 2017-03-22 ENCOUNTER — Ambulatory Visit: Payer: Self-pay

## 2017-03-22 DIAGNOSIS — I2699 Other pulmonary embolism without acute cor pulmonale: Secondary | ICD-10-CM

## 2017-03-22 DIAGNOSIS — D6851 Activated protein C resistance: Secondary | ICD-10-CM

## 2017-03-22 DIAGNOSIS — I824Z9 Acute embolism and thrombosis of unspecified deep veins of unspecified distal lower extremity: Secondary | ICD-10-CM

## 2017-03-22 DIAGNOSIS — Z7901 Long term (current) use of anticoagulants: Secondary | ICD-10-CM

## 2017-03-22 LAB — POCT INR: INR: 8

## 2017-03-22 NOTE — Telephone Encounter (Signed)
INR today 8.0. Spoke with patient.  He denies any bruising or bleeding and actually even skipped his dose this past Friday.    Patient reports that he has had to take increased amounts of pain medication (morphine) since the 16th but otherwise no changes.  He has also not needed any insulin in about 3 weeks now as his levels are bordering on the low side.   Patient's INR reading 8.0 today on home monitoring device.  Patient obviously needs to hold today but will need to discuss further with Dr. Darnell Level concerning how we should proceed.   Patient is aware that coag encounter meter is not as reliable with readings at 6.0 or higher and knows the most accurate test is to come in for a venipuncture.    Dr. Darnell Level,  Please advise on how to proceed.  Patient's INR has been bouncing in and out of normal range (from normal to 6, 7 and now 8.0/9.0 reading) over past 6-8 weeks likely due to his disease process.  I am not sure how to proceed with patient. He is holding his dose currently until further instruction, and I am considering the following:  1. Have patient come in for venipuncture to confirm (patient hasn't wanted to in the past but if needed will come in). 2. In addition to hold, should patient take a dose of oral Vitamin K (recommended 1/2 of a 2.5mg  tablet)? 3. What is patient's safety moving forward with in home checks given erratic readings.  4.  Consider safety risks moving forward with coumadin therapy in general (CVA risks -  hemorrhagic v/s clotting)    What are your thoughts moving forward?  Thanks.

## 2017-03-22 NOTE — Telephone Encounter (Signed)
Verdis Frederickson called from [facility] regarding an INR of  8.0 that was done this morning on Evan Cabrera.   Provider:  [name], MD

## 2017-03-22 NOTE — Telephone Encounter (Signed)
This is being addressed and I have been notified earlier in the am.  Please refer to other phone encounter and coag encounter addressing reading.

## 2017-03-22 NOTE — Telephone Encounter (Signed)
Copied from [place]. Topic: Quick Communication - See Telephone Encounter >> Mar 22, 2017  8:54 AM Synthia Innocent wrote: CRM for notification. See Telephone encounter for: patient calling coumadin level 8.0, requesting to speak with [facility]  03/22/17.

## 2017-03-23 ENCOUNTER — Other Ambulatory Visit (INDEPENDENT_AMBULATORY_CARE_PROVIDER_SITE_OTHER): Payer: 59

## 2017-03-23 ENCOUNTER — Ambulatory Visit: Payer: Self-pay

## 2017-03-23 ENCOUNTER — Telehealth: Payer: Self-pay | Admitting: Family Medicine

## 2017-03-23 DIAGNOSIS — D6851 Activated protein C resistance: Secondary | ICD-10-CM

## 2017-03-23 DIAGNOSIS — Z7901 Long term (current) use of anticoagulants: Secondary | ICD-10-CM

## 2017-03-23 LAB — PROTIME-INR: INR: 9.2 ratio (ref 0.8–1.0)

## 2017-03-23 LAB — POCT INR: INR: 9.2

## 2017-03-23 NOTE — Telephone Encounter (Signed)
[facility] called with critical lab results. protime 97.2   INR 9.2 Results given to Dr.Gutierrez. Lendon Collar

## 2017-03-23 NOTE — Telephone Encounter (Signed)
Spoke with patient regarding response from Dr. Darnell Level:  Patient denies having a blood clot while on coumadin therapy with INR at a 2-3 range.  In addition, he is okay with adjusting his range to (2-3) and will be in this afternoon for stat PT/INR via venipuncture for confirmation.  He is aware to continue to hold coumadin until further instructions and denies any problems with bleeding or abnormal bruising at the present.  I have put in orders for the lab for PT/INR.

## 2017-03-23 NOTE — Telephone Encounter (Signed)
Lab Results  Component Value Date   INR 3.0 03/11/2017   INR 2 03/07/2017   INR 2.0 03/07/2017   If no bleeding, ok to hold vitamin K for now as long as INR levels are coming down. However agree with lower threshold to treat given active malignancy.   I do think we should have him come in for venipucture to confirm INR level then decide further on treatment.   Would touch base with patient about decreased INR goal (2-3) given difficulty with recent supratherapeutic levels. I don't think he's ever had blood clot while coumadin was in 2-2.5 range? Vs just continued close monitoring as we are doing.

## 2017-03-23 NOTE — Telephone Encounter (Signed)
Spoke with patient about INR 9.2. Will continue to hold. Leafy Ro talk to me about plan from here on - will likely recommend 2.5mg  vit K. I told patient we would call him tomorrow with further plan.

## 2017-03-24 MED ORDER — PHYTONADIONE 5 MG PO TABS: ORAL_TABLET | ORAL | 0 refills | Status: AC

## 2017-03-24 NOTE — Telephone Encounter (Signed)
Please refer to Panic INR result telephone encounter for ongoing details related to this message string.

## 2017-03-24 NOTE — Telephone Encounter (Addendum)
Spoke with patient's wife, Sharyn Lull, and gave her instructions to pick up and have patient take 1/2 (2.5mg ) of 5mg  Vitamin K today and continue to to hold coumadin through Monday.  He is to retest using home meter on Monday and then we will discuss a dosing plan from there.  Patient and wife would like Vitamin K called in to [facility] as their usual pharmacy does not have the Vitamin K in stock.  Will send in and ask for pharmacy to put a rush on filling.  Patient is to take as soon as receiving and okay to take with a snack.    R/x sent in for mephyton 5mg  #1 tablet only 0 refills to [facility].  Wife will pick up this pm and have patient take.  They are aware of the risks associated with supratherapeutic level and will go to ER if any bleeding/bruising concerns arise.

## 2017-03-24 NOTE — Patient Instructions (Signed)
INR on 3/20 confirmed by venipuncture:  9.2  3/20: Patient contacted and instructed to hold his coumadin and await further instructions.   3/21 - Dr. Darnell Level and I discussed patient's readings and concerns for ongoing coumadin management r/t malignancy disease process.  (please refer to phone encounter for more details).  Currently, we will have patient take 2.5mg  of Vitamin K and hold coumadin through the weekend.  He can re-test at home on Monday and call us with the results.  If still at or above 6, patient will need to come back in for another venipuncture to confirm reading.    Also, we will be adjusting his therapeutic range to 2.0 - 3.0 to assist with better management due to ongoing hypercoaguable state.  Patient and wife verbalize understanding of all instructions given today and patient is in agreement with range adjustment.    Patient denies any abnormal bruising or bleeding and is aware of the risks associated with a supratherapeutic reading,  He will go to the ER if any concerns develop.

## 2017-03-24 NOTE — Addendum Note (Signed)
Addended by: Magdalen Spatz C on: 03/24/2017 03:46 PM   Modules accepted: Orders

## 2017-03-28 ENCOUNTER — Ambulatory Visit: Payer: Self-pay

## 2017-03-28 DIAGNOSIS — D6851 Activated protein C resistance: Secondary | ICD-10-CM

## 2017-03-28 DIAGNOSIS — Z7901 Long term (current) use of anticoagulants: Secondary | ICD-10-CM

## 2017-03-28 LAB — POCT INR: INR: 2.4

## 2017-03-28 NOTE — Patient Instructions (Signed)
INR on 3/25 per [facility] Monitoring:  2.4  New therapeutic ranger: 2.0-3.0  Spoke with patient and he is to start decreased dosing of 2.5mg  daily and he will recheck on Friday (3/29) using home INR monitor.    Dr. Darnell Level is aware of readings today and subsequent decision to decrease dosing by 41%.  We will monitor patient very closely right now given recent elevated readings.  Patient denies any abnormal bruising or bleeding and will go to the ER if any concerns develop.  Patient verbalizes understanding of all instructions given today.

## 2017-04-01 ENCOUNTER — Telehealth: Payer: Self-pay

## 2017-04-01 ENCOUNTER — Ambulatory Visit: Payer: Self-pay

## 2017-04-01 DIAGNOSIS — Z7901 Long term (current) use of anticoagulants: Secondary | ICD-10-CM

## 2017-04-01 DIAGNOSIS — D6851 Activated protein C resistance: Secondary | ICD-10-CM

## 2017-04-01 LAB — POCT INR: INR: 6.3

## 2017-04-01 NOTE — Patient Instructions (Signed)
INR on 3/29 per [facility] Monitoring:  6.3  New therapeutic range: 2.0-3.0  Spoke with patient and he was told to hold his coumadin on 3/29, 3/30, 3/31 and retest on 4/1 before discussing further dose reductions.    Dr. Darnell Level is aware of readings today and is in agreement with hold plan.  We will monitor patient very closely right now given recent elevated readings.  Patient denies any abnormal bruising or bleeding and will go to the ER if any concerns develop.

## 2017-04-01 NOTE — Telephone Encounter (Signed)
Copied from [place] [id number]. Topic: Quick Communication - See Telephone Encounter >> Apr 01, 2017 10:28 AM Antonieta Iba C wrote: CRM for notification. See Telephone encounter for: 04/01/17.  Pt's INR # for today is 6.3, pt was told to call in and report number to [facility].

## 2017-04-04 ENCOUNTER — Telehealth: Payer: Self-pay | Admitting: Family Medicine

## 2017-04-04 ENCOUNTER — Ambulatory Visit: Payer: Self-pay

## 2017-04-04 DIAGNOSIS — D6851 Activated protein C resistance: Secondary | ICD-10-CM

## 2017-04-04 DIAGNOSIS — Z7901 Long term (current) use of anticoagulants: Secondary | ICD-10-CM

## 2017-04-04 LAB — POCT INR: INR: 4.1

## 2017-04-04 MED ORDER — WARFARIN SODIUM 2 MG PO TABS: ORAL_TABLET | ORAL | 2 refills | Status: AC

## 2017-04-04 NOTE — Telephone Encounter (Signed)
Noted and have spoken with patient and wife.  Please refer to coag encounter note for full details.  Thanks.

## 2017-04-04 NOTE — Telephone Encounter (Signed)
Copied from [place] [id number]. Topic: Quick Communication - See Telephone Encounter >> Apr 01, 2017 10:28 AM Antonieta Iba C wrote: CRM for notification. See Telephone encounter for: 04/01/17.  Pt's INR # for today is 6.3, pt was told to call in and report number to [facility].  >> Apr 04, 2017 10:18 AM Cleaster Corin, NT wrote: Pt. Calling to report  INR to [facility] and that number is 4.1

## 2017-04-04 NOTE — Patient Instructions (Signed)
INR on 04/04/17  per [facility]:  4.1  New therapeutic range: 2.0-3.0  Spoke with patient and informed of the following: (wife present and able to verbalize back all instructions)  Continue to hold coumadin today 4/1 and tomorrow 4/2 and then decrease weekly dosing by approx 37% to taking 2mg  daily EXCEPT for 1mg  on Mon, Wed, and Fri.  Recheck in 1 week.  I have sent in new R/X to patient's pharmacy for Warfarin 2mg  tablets, #30, 2 refills. Patient/wife aware to pick up at earliest convenience.     We will continue to monitor patient very closely right now given recent elevated readings.  Patient verbalizes understanding of all directions and  denies any abnormal bruising or bleeding and will go to the ER if any concerns develop.

## 2017-04-11 ENCOUNTER — Ambulatory Visit: Payer: Self-pay

## 2017-04-11 ENCOUNTER — Telehealth: Payer: Self-pay | Admitting: Family Medicine

## 2017-04-11 DIAGNOSIS — Z7901 Long term (current) use of anticoagulants: Secondary | ICD-10-CM

## 2017-04-11 DIAGNOSIS — D6851 Activated protein C resistance: Secondary | ICD-10-CM

## 2017-04-11 LAB — POCT INR: INR: 2.7

## 2017-04-11 NOTE — Telephone Encounter (Signed)
Copied from [place] [id number]. Topic: General - Other >> Apr 11, 2017  4:05 PM Margot Ables wrote: Reason for CRM: Sharyn Lull is reporting pts INR from today mid-day 2.7. Pt usually calls in the morning and she didn't. She isn't used to it and not sure what his dose should be tonight. Please call her back to advise. Sharyn Lull requested this to be sent as urgent/priority so she knows dose for tonight.

## 2017-04-11 NOTE — Patient Instructions (Signed)
INR on 04/11/17  per [facility]:  2.7  New therapeutic ranger: 2.0-3.0  Spoke with patient's wife and informed of the following: (wife present and able to verbalize back all instructions)  Continue to take 1mg  on Mon, Wed, Fri and 2mg  on Sun, Tues, Thurs, Sat.  Recheck in 1 week.   We will continue to monitor patient very closely right now given recent, fluctuating elevated readings.

## 2017-04-11 NOTE — Telephone Encounter (Signed)
Erroneous encounter

## 2017-04-11 NOTE — Telephone Encounter (Signed)
Spoke with patient's wife (please refer to the coag encounter note from today 4/8 for details).

## 2017-04-13 ENCOUNTER — Telehealth: Payer: Self-pay

## 2017-04-13 DIAGNOSIS — K8689 Other specified diseases of pancreas: Secondary | ICD-10-CM

## 2017-04-13 MED ORDER — DRONABINOL 2.5 MG PO CAPS: 2.5000 mg | ORAL_CAPSULE | Freq: Two times a day (BID) | ORAL | 0 refills | Status: AC

## 2017-04-13 NOTE — Telephone Encounter (Signed)
Returned call to [place] from [facility]. Request for marinol for decreased appetite. Ok per Dr. Benay Spice, prescription sent to pharmacy. Made Keisha aware.

## 2017-04-18 ENCOUNTER — Telehealth: Payer: Self-pay | Admitting: Family Medicine

## 2017-04-18 ENCOUNTER — Ambulatory Visit: Payer: Self-pay

## 2017-04-18 DIAGNOSIS — D6851 Activated protein C resistance: Secondary | ICD-10-CM

## 2017-04-18 DIAGNOSIS — Z7901 Long term (current) use of anticoagulants: Secondary | ICD-10-CM

## 2017-04-18 LAB — POCT INR: INR: 8

## 2017-04-18 NOTE — Patient Instructions (Signed)
INR TODAY 04/18/17:  8.0  Spoke with patient's wife.  Patient is staying in the bed now and is not eating very much.  Hospice is very active in the home with patient and wife.  We did briefly discuss consideration for stopping coumadin therapy due to his progressed disease process and risks for hemorrhaging complications.  Wife and husband are going to discuss this with hospice and I will discuss further with Dr. Darnell Level. Wife denies any abnormal bruising or bleeding at the present and will call hospice and/or go to ER if any concerns develop.    For now, patient is to hold his coumadin and retest on Thursday before deciding next steps.  Wife verbalizes understanding and will call if any questions or concerns before then

## 2017-04-18 NOTE — Telephone Encounter (Signed)
Pt Calling  To  Notify  Evan Cabrera   That  His  INR was 8  Pt reports  To call him if  Needed

## 2017-04-18 NOTE — Telephone Encounter (Signed)
Spoke with patient's wife.  Patient is staying in the bed now and is not eating very much.  Hospice is very active in the home with patient and wife.  We did briefly discuss consideration for stopping coumadin therapy due to his progressed disease process and increasing risks for bleeding complications.  Wife and husband are going to discuss this with hospice and I will discuss further with Dr. Darnell Level.   For now, patient is to hold his coumadin and retest on Thursday before deciding next steps.  Wife verbalizes understanding and will call if any questions or concerns before then.

## 2017-04-18 NOTE — Telephone Encounter (Signed)
Copied from [place] [phone]. Topic: Inquiry >> Apr 18, 2017  3:46 PM Oliver Pila B wrote: Reason for CRM: pt is giving his INR # for M[facility],  INR_ 8.0, have Mandy contact pt it needed

## 2017-04-19 NOTE — Telephone Encounter (Signed)
I will talk with Leafy Ro about this.

## 2017-04-20 ENCOUNTER — Telehealth: Payer: Self-pay

## 2017-04-20 ENCOUNTER — Telehealth: Payer: Self-pay | Admitting: Family Medicine

## 2017-04-21 NOTE — Telephone Encounter (Signed)
Spoke with Daughter this morning regarding her FMLA paperwork she stated she didn't need this anymore Her father had passed  She wanted dr Darnell Level and Leafy Ro know the arraignments  Friday visitations 6 to [place] and [place]  Saturday @ [place]

## 2017-04-21 NOTE — Telephone Encounter (Signed)
Spoke with daughter, expressed my condolences. 

## 2017-04-26 NOTE — Telephone Encounter (Signed)
Thank you, I was made aware of this last week.

## 2017-05-04 NOTE — Telephone Encounter (Signed)
Called - did not leave message. Will try later.

## 2017-05-04 NOTE — Telephone Encounter (Signed)
Karel from [facility] called to inform that pt Evan Cabrera October 02, 2060 expired 05-19-2017 at 0410. This Rn voiced understanding. MD made aware.

## 2017-05-04 NOTE — Telephone Encounter (Signed)
Please see phone encounter from wife this am.  Wife called to report patient's passing today at 4am.

## 2017-05-04 NOTE — Telephone Encounter (Signed)
Spoke with wife, wife calling to report that patient passed away this morning peacefully with family and hospice by his side.  She would like to extend heartfelt thanks to Dr. Danise Mina for his prayers and genuine care and concern for the patient and family.  They could tell that it was real and it meant a great deal to them in recent months.

## 2017-05-04 NOTE — Telephone Encounter (Signed)
Copied from [place] [phone]. Topic: Quick Communication - See Telephone Encounter >> 2017/05/04  8:08 AM Aurelio Brash B wrote: CRM for notification. See Telephone encounter for: 05/04/17. PT's wife is requesting a call from [facility] in coumadin clinic -  [phone]

## 2017-05-04 DEATH — deceased

## 2019-10-21 IMAGING — CT CT ABD-PELV W/ CM
2 of 5 series · 16 of 46 positions shown, 18 images · IV contrast (ISOVUE 300)
Comparison: Ultrasound 08/01/2015

CLINICAL DATA: Abdominal pain, epigastric pain, radiating to back.

EXAM:
CT ABDOMEN AND PELVIS WITH CONTRAST
TECHNIQUE: Multidetector CT imaging of the abdomen and pelvis was performed
using the standard protocol following bolus administration of
intravenous contrast.
CONTRAST:  100mL 9OBOFL-VNN IOPAMIDOL (9OBOFL-VNN) INJECTION 61%

[Series 2: abd/pel w · axial · 0.79mm/px · z∈[+800,+1215]mm · 13 of 93 slices shown, 15 images]
[im 5/93  soft-tissue]
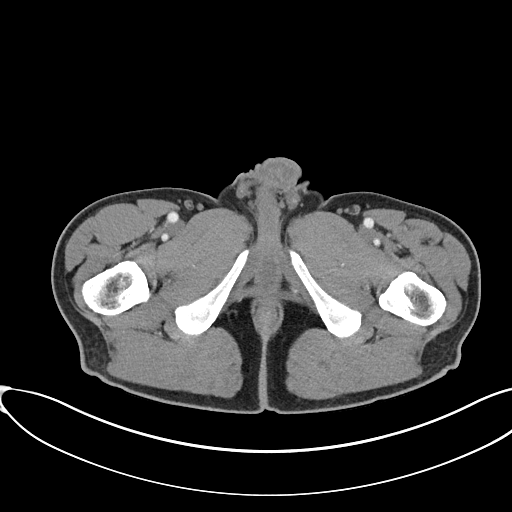
[im 5/93  bone]
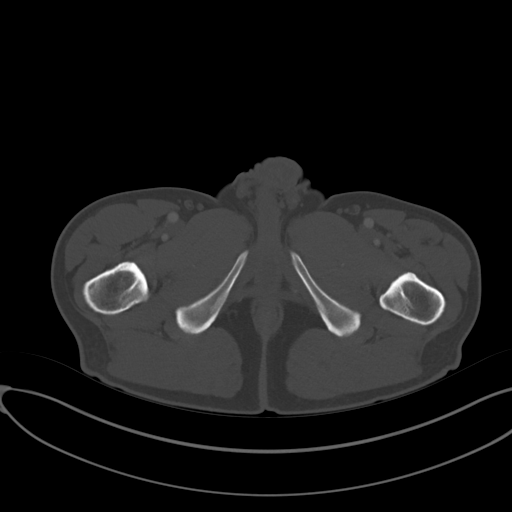
[im 15/93  soft-tissue]
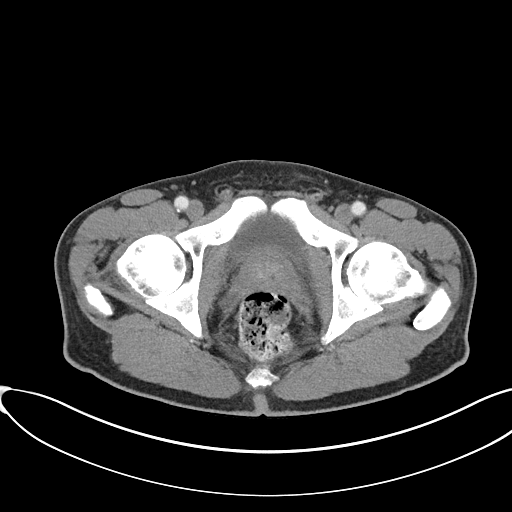
[im 20/93  soft-tissue]
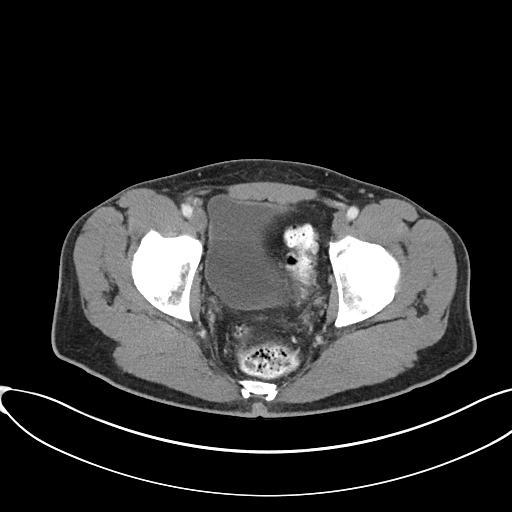
[im 25/93  soft-tissue]
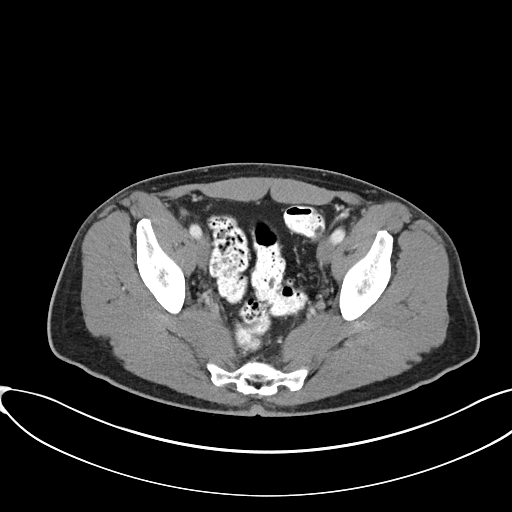
[im 34/93  soft-tissue]
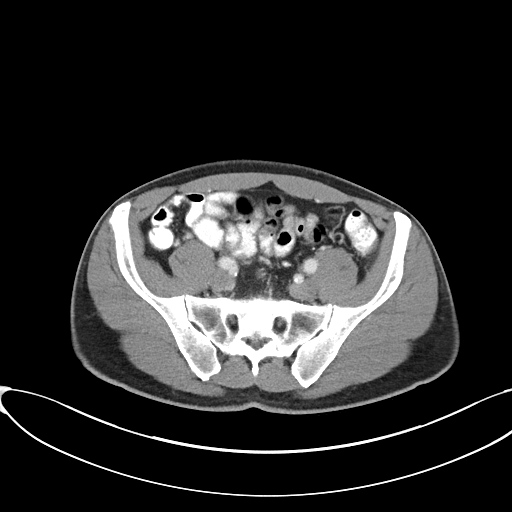
[im 39/93  soft-tissue]
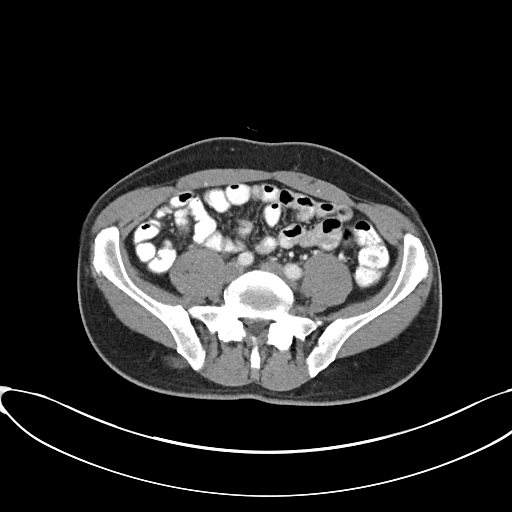
[im 49/93  soft-tissue]
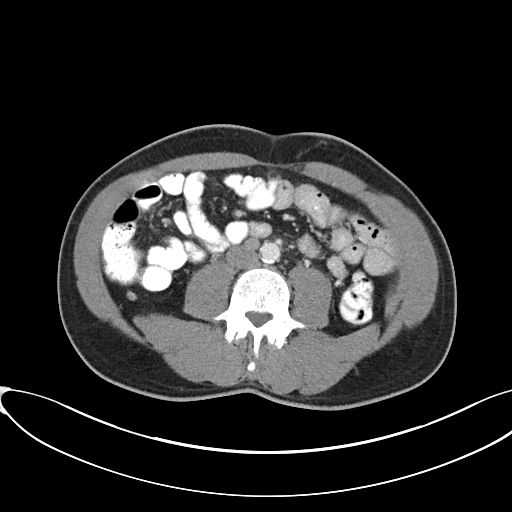
[im 54/93  soft-tissue]
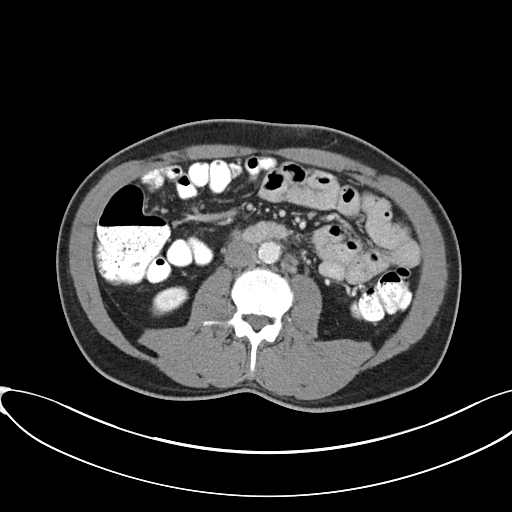
[im 59/93  soft-tissue]
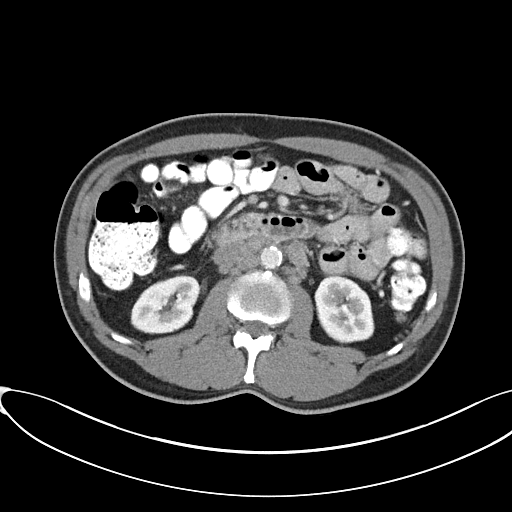
[im 59/93  bone]
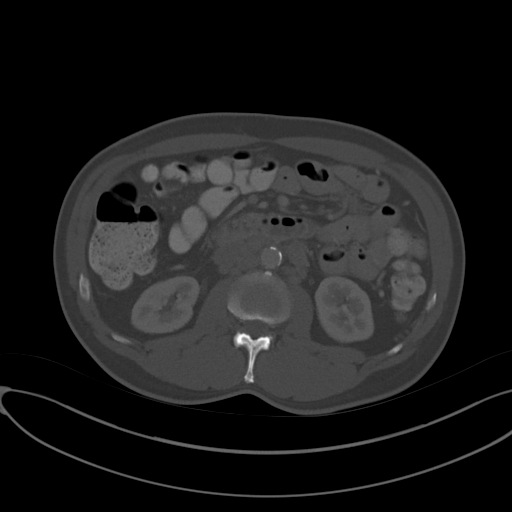
[im 68/93  soft-tissue]
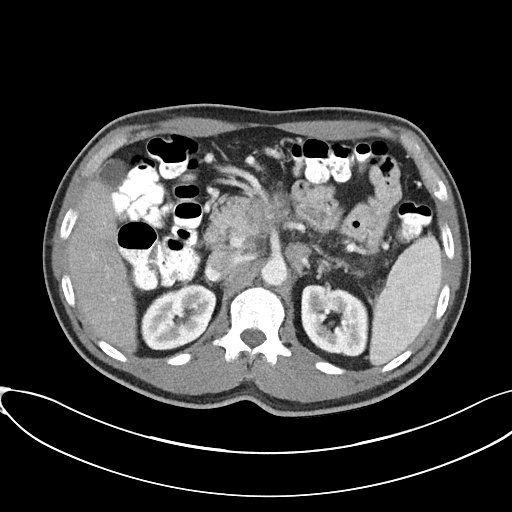
[im 73/93  soft-tissue]
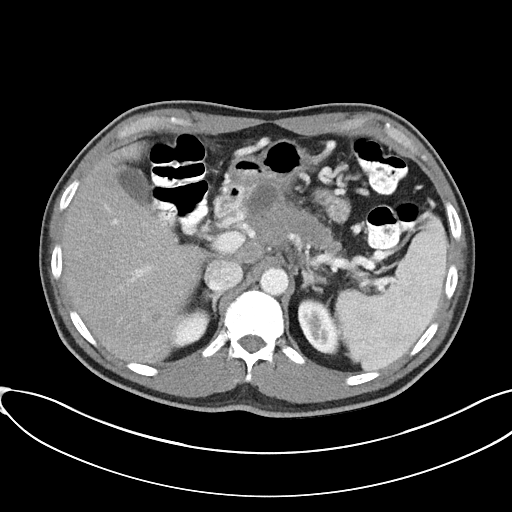
[im 78/93  soft-tissue]
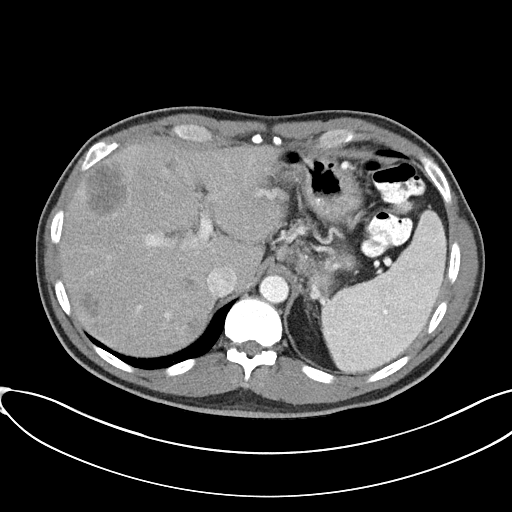
[im 88/93  soft-tissue]
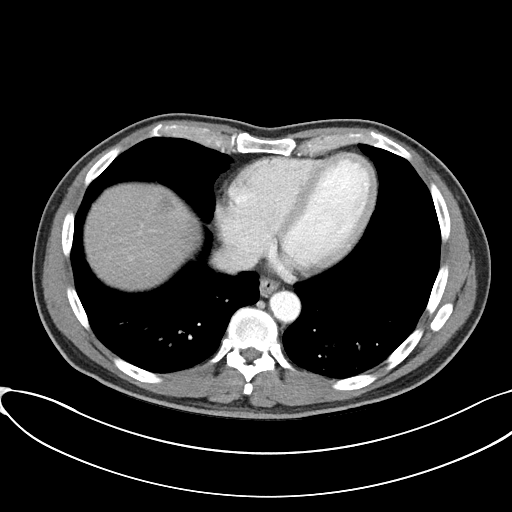

[Series 5: abd/pel w st · coronal · 0.80mm/px · 3 of 82 slices shown]
[im 28/82  soft-tissue]
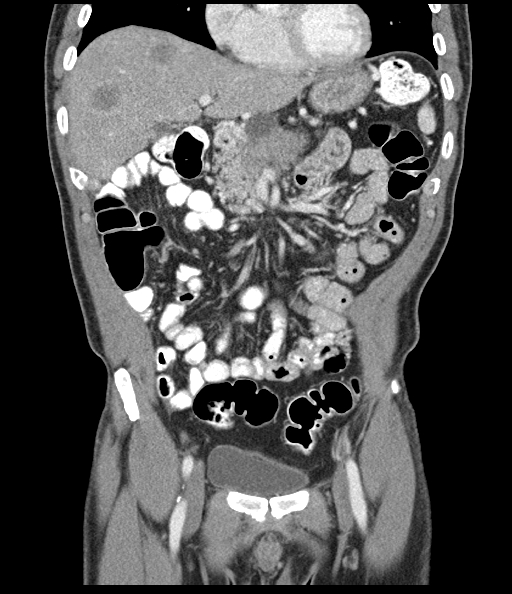
[im 37/82  soft-tissue]
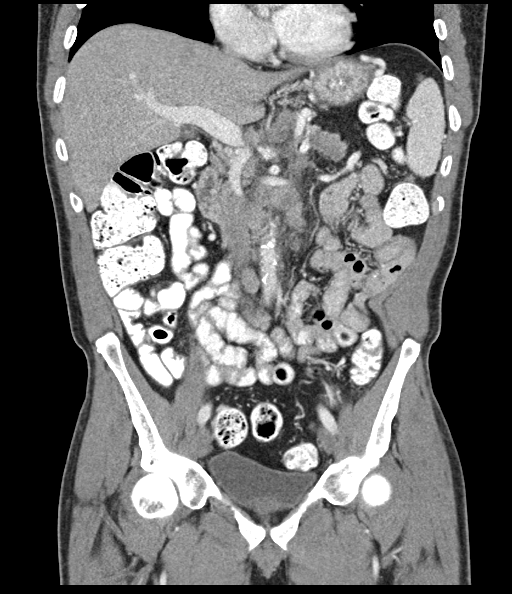
[im 46/82  soft-tissue]
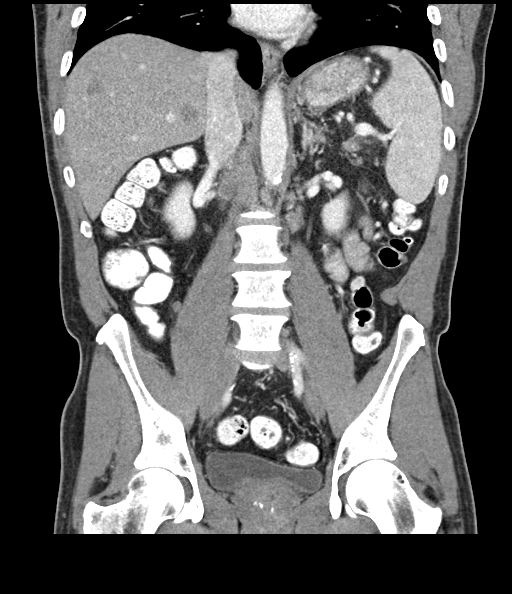

[16 of 46 positions shown; findings below may reference images not displayed]

FINDINGS: Lower chest: Lung bases are clear. No effusions. Heart is normal
size.

Hepatobiliary: Numerous solid-appearing masses throughout the liver,
the largest in the right hepatic lobe measuring approximately 4 cm.
These are concerning for metastases. Diffuse fatty infiltration of
the liver. Gallbladder unremarkable.

Pancreas: Area of masslike low-density involving much of the
pancreatic body an neck, extending over a a 7 cm length on image 23.
Adjacent cystic mass anterior to the pancreas measures up to 3 cm on
image 19. Peripancreatic lymph nodes noted, the largest on image 18
with a short axis diameter of 15 mm. No ductal dilatation.

Spleen: No focal abnormality.  Normal size.

Adrenals/Urinary Tract: No adrenal abnormality. No focal renal
abnormality. No stones or hydronephrosis. Urinary bladder is
unremarkable.

Stomach/Bowel: Appendix is normal. Stomach, large and small bowel
grossly unremarkable.

Vascular/Lymphatic: Scattered aortic calcifications. No aneurysm.
There is retroperitoneal adenopathy. Conglomerate periaortic lymph
node mass has a short axis diameter of 1.7 cm on image 32. No
adenopathy in the pelvis. There is occlusion of the splenic vein
with short gastric varices in the left upper quadrant.

Reproductive: Mild prostate enlargement.

Other: No free fluid or free air.

Musculoskeletal: Several sclerotic foci noted within the left
acetabulum and femoral head, nonspecific. These may reflect bone
islands. Small sclerotic focus in the left sacral ala.
IMPRESSION: Concern for large pancreatic neck/ body mass concerning for
pancreatic malignancy. Recommend further evaluation with pancreatic
protocol MRI.

Numerous liver metastases. Peripancreatic and retroperitoneal
adenopathy.

Occlusion of the splenic vein with left upper quadrant short gastric
varices.

These results were called by telephone at the time of interpretation
on 12/24/2016 at [DATE] to Dr. MURIIC BRONJA , who verbally
acknowledged these results.
